# Patient Record
Sex: Female | Born: 1955
Health system: Southern US, Community
[De-identification: ages and names within clinical notes are randomized; demographics above are authoritative.]

## PROBLEM LIST (undated history)

## (undated) DIAGNOSIS — M7070 Other bursitis of hip, unspecified hip: Secondary | ICD-10-CM

## (undated) DIAGNOSIS — M4800 Spinal stenosis, site unspecified: Secondary | ICD-10-CM

## (undated) DIAGNOSIS — K219 Gastro-esophageal reflux disease without esophagitis: Secondary | ICD-10-CM

## (undated) DIAGNOSIS — R202 Paresthesia of skin: Secondary | ICD-10-CM

## (undated) DIAGNOSIS — R251 Tremor, unspecified: Secondary | ICD-10-CM

## (undated) DIAGNOSIS — E079 Disorder of thyroid, unspecified: Secondary | ICD-10-CM

## (undated) DIAGNOSIS — E669 Obesity, unspecified: Secondary | ICD-10-CM

## (undated) DIAGNOSIS — M797 Fibromyalgia: Secondary | ICD-10-CM

## (undated) DIAGNOSIS — E039 Hypothyroidism, unspecified: Secondary | ICD-10-CM

## (undated) DIAGNOSIS — M51369 Other intervertebral disc degeneration, lumbar region without mention of lumbar back pain or lower extremity pain: Secondary | ICD-10-CM

## (undated) DIAGNOSIS — R51 Headache: Secondary | ICD-10-CM

## (undated) DIAGNOSIS — M1990 Unspecified osteoarthritis, unspecified site: Secondary | ICD-10-CM

## (undated) DIAGNOSIS — K7689 Other specified diseases of liver: Secondary | ICD-10-CM

## (undated) DIAGNOSIS — M71329 Other bursal cyst, unspecified elbow: Secondary | ICD-10-CM

## (undated) DIAGNOSIS — M5136 Other intervertebral disc degeneration, lumbar region: Secondary | ICD-10-CM

## (undated) DIAGNOSIS — E063 Autoimmune thyroiditis: Secondary | ICD-10-CM

## (undated) DIAGNOSIS — G629 Polyneuropathy, unspecified: Secondary | ICD-10-CM

## (undated) DIAGNOSIS — R700 Elevated erythrocyte sedimentation rate: Secondary | ICD-10-CM

## (undated) DIAGNOSIS — E215 Disorder of parathyroid gland, unspecified: Secondary | ICD-10-CM

## (undated) DIAGNOSIS — E559 Vitamin D deficiency, unspecified: Secondary | ICD-10-CM

## (undated) DIAGNOSIS — E213 Hyperparathyroidism, unspecified: Secondary | ICD-10-CM

## (undated) DIAGNOSIS — E785 Hyperlipidemia, unspecified: Secondary | ICD-10-CM

## (undated) DIAGNOSIS — K760 Fatty (change of) liver, not elsewhere classified: Secondary | ICD-10-CM

## (undated) DIAGNOSIS — R519 Headache, unspecified: Secondary | ICD-10-CM

## (undated) HISTORY — DX: Other bursitis of hip, unspecified hip: M70.70

## (undated) HISTORY — DX: Hypercalcemia: E83.52

## (undated) HISTORY — DX: Polyneuropathy, unspecified: G62.9

## (undated) HISTORY — PX: ANKLE SURGERY: SHX546

## (undated) HISTORY — PX: ABDOMINAL HYSTERECTOMY: SHX81

## (undated) HISTORY — DX: Tremor, unspecified: R25.1

## (undated) HISTORY — PX: FOOT SURGERY: SHX648

## (undated) HISTORY — DX: Other intervertebral disc degeneration, lumbar region: M51.36

## (undated) HISTORY — DX: Paresthesia of skin: R20.2

## (undated) HISTORY — DX: Elevated erythrocyte sedimentation rate: R70.0

## (undated) HISTORY — DX: Other intervertebral disc degeneration, lumbar region without mention of lumbar back pain or lower extremity pain: M51.369

## (undated) HISTORY — DX: Fatty (change of) liver, not elsewhere classified: K76.0

## (undated) HISTORY — DX: Hyperlipidemia, unspecified: E78.5

## (undated) HISTORY — DX: Other bursal cyst, unspecified elbow: M71.329

## (undated) HISTORY — DX: Hyperparathyroidism, unspecified: E21.3

## (undated) HISTORY — PX: TOE SURGERY: SHX1073

## (undated) HISTORY — DX: Vitamin D deficiency, unspecified: E55.9

## (undated) HISTORY — DX: Unspecified osteoarthritis, unspecified site: M19.90

## (undated) HISTORY — DX: Obesity, unspecified: E66.9

---

## 1999-10-15 ENCOUNTER — Encounter: Payer: Self-pay | Admitting: *Deleted

## 1999-10-15 ENCOUNTER — Ambulatory Visit (HOSPITAL_COMMUNITY): Admission: RE | Admit: 1999-10-15 | Discharge: 1999-10-15 | Payer: Self-pay | Admitting: *Deleted

## 2001-05-20 ENCOUNTER — Encounter: Payer: Self-pay | Admitting: *Deleted

## 2001-05-20 ENCOUNTER — Ambulatory Visit (HOSPITAL_COMMUNITY): Admission: RE | Admit: 2001-05-20 | Discharge: 2001-05-20 | Payer: Self-pay | Admitting: *Deleted

## 2001-05-27 ENCOUNTER — Encounter: Payer: Self-pay | Admitting: *Deleted

## 2001-05-27 ENCOUNTER — Ambulatory Visit (HOSPITAL_COMMUNITY): Admission: RE | Admit: 2001-05-27 | Discharge: 2001-05-27 | Payer: Self-pay | Admitting: *Deleted

## 2001-06-06 ENCOUNTER — Ambulatory Visit (HOSPITAL_COMMUNITY): Admission: RE | Admit: 2001-06-06 | Discharge: 2001-06-06 | Payer: Self-pay | Admitting: Neurosurgery

## 2001-06-06 ENCOUNTER — Encounter: Payer: Self-pay | Admitting: Neurosurgery

## 2001-12-20 ENCOUNTER — Encounter: Payer: Self-pay | Admitting: Internal Medicine

## 2001-12-20 ENCOUNTER — Ambulatory Visit (HOSPITAL_COMMUNITY): Admission: RE | Admit: 2001-12-20 | Discharge: 2001-12-20 | Payer: Self-pay | Admitting: Internal Medicine

## 2002-05-30 ENCOUNTER — Ambulatory Visit (HOSPITAL_COMMUNITY): Admission: RE | Admit: 2002-05-30 | Discharge: 2002-05-30 | Payer: Self-pay | Admitting: Internal Medicine

## 2002-05-30 ENCOUNTER — Encounter: Payer: Self-pay | Admitting: Internal Medicine

## 2003-07-16 ENCOUNTER — Encounter: Payer: Self-pay | Admitting: Family Medicine

## 2003-07-16 ENCOUNTER — Encounter: Admission: RE | Admit: 2003-07-16 | Discharge: 2003-07-16 | Payer: Self-pay | Admitting: Family Medicine

## 2003-10-08 ENCOUNTER — Encounter: Admission: RE | Admit: 2003-10-08 | Discharge: 2003-10-08 | Payer: Self-pay | Admitting: Family Medicine

## 2004-05-19 ENCOUNTER — Emergency Department (HOSPITAL_COMMUNITY): Admission: EM | Admit: 2004-05-19 | Discharge: 2004-05-19 | Payer: Self-pay | Admitting: Family Medicine

## 2004-05-24 ENCOUNTER — Emergency Department (HOSPITAL_COMMUNITY): Admission: EM | Admit: 2004-05-24 | Discharge: 2004-05-24 | Payer: Self-pay | Admitting: Family Medicine

## 2004-10-20 ENCOUNTER — Encounter: Admission: RE | Admit: 2004-10-20 | Discharge: 2004-10-20 | Payer: Self-pay | Admitting: Family Medicine

## 2004-10-31 ENCOUNTER — Emergency Department (HOSPITAL_COMMUNITY): Admission: EM | Admit: 2004-10-31 | Discharge: 2004-10-31 | Payer: Self-pay | Admitting: Family Medicine

## 2005-02-11 ENCOUNTER — Emergency Department (HOSPITAL_COMMUNITY): Admission: EM | Admit: 2005-02-11 | Discharge: 2005-02-11 | Payer: Self-pay | Admitting: Emergency Medicine

## 2006-03-29 ENCOUNTER — Encounter: Admission: RE | Admit: 2006-03-29 | Discharge: 2006-03-29 | Payer: Self-pay | Admitting: Family Medicine

## 2006-07-14 ENCOUNTER — Emergency Department (HOSPITAL_COMMUNITY): Admission: EM | Admit: 2006-07-14 | Discharge: 2006-07-14 | Payer: Self-pay | Admitting: Emergency Medicine

## 2006-07-28 ENCOUNTER — Emergency Department (HOSPITAL_COMMUNITY): Admission: EM | Admit: 2006-07-28 | Discharge: 2006-07-28 | Payer: Self-pay | Admitting: Emergency Medicine

## 2006-11-06 ENCOUNTER — Emergency Department (HOSPITAL_COMMUNITY): Admission: EM | Admit: 2006-11-06 | Discharge: 2006-11-06 | Payer: Self-pay | Admitting: Emergency Medicine

## 2007-11-02 ENCOUNTER — Emergency Department (HOSPITAL_COMMUNITY): Admission: EM | Admit: 2007-11-02 | Discharge: 2007-11-02 | Payer: Self-pay | Admitting: Emergency Medicine

## 2008-04-04 ENCOUNTER — Encounter: Admission: RE | Admit: 2008-04-04 | Discharge: 2008-04-04 | Payer: Self-pay | Admitting: Family Medicine

## 2008-08-14 ENCOUNTER — Emergency Department (HOSPITAL_COMMUNITY): Admission: EM | Admit: 2008-08-14 | Discharge: 2008-08-14 | Payer: Self-pay | Admitting: Emergency Medicine

## 2008-09-05 ENCOUNTER — Encounter: Admission: RE | Admit: 2008-09-05 | Discharge: 2008-09-05 | Payer: Self-pay | Admitting: Family Medicine

## 2008-11-04 ENCOUNTER — Emergency Department (HOSPITAL_COMMUNITY): Admission: EM | Admit: 2008-11-04 | Discharge: 2008-11-04 | Payer: Self-pay | Admitting: Emergency Medicine

## 2008-12-21 ENCOUNTER — Encounter: Admission: RE | Admit: 2008-12-21 | Discharge: 2008-12-21 | Payer: Self-pay | Admitting: Emergency Medicine

## 2008-12-22 ENCOUNTER — Encounter: Admission: RE | Admit: 2008-12-22 | Discharge: 2008-12-22 | Payer: Self-pay | Admitting: Emergency Medicine

## 2009-02-05 ENCOUNTER — Encounter: Payer: Self-pay | Admitting: Cardiology

## 2009-02-05 ENCOUNTER — Ambulatory Visit: Payer: Self-pay | Admitting: Cardiology

## 2009-02-05 DIAGNOSIS — M7150 Other bursitis, not elsewhere classified, unspecified site: Secondary | ICD-10-CM | POA: Insufficient documentation

## 2009-02-05 DIAGNOSIS — K219 Gastro-esophageal reflux disease without esophagitis: Secondary | ICD-10-CM | POA: Insufficient documentation

## 2009-02-05 DIAGNOSIS — R9431 Abnormal electrocardiogram [ECG] [EKG]: Secondary | ICD-10-CM | POA: Insufficient documentation

## 2009-05-15 ENCOUNTER — Emergency Department (HOSPITAL_COMMUNITY): Admission: EM | Admit: 2009-05-15 | Discharge: 2009-05-15 | Payer: Self-pay | Admitting: Emergency Medicine

## 2009-10-16 ENCOUNTER — Emergency Department (HOSPITAL_COMMUNITY): Admission: EM | Admit: 2009-10-16 | Discharge: 2009-10-16 | Payer: Self-pay | Admitting: Emergency Medicine

## 2009-10-20 ENCOUNTER — Encounter: Admission: RE | Admit: 2009-10-20 | Discharge: 2009-10-20 | Payer: Self-pay | Admitting: Family Medicine

## 2010-05-30 ENCOUNTER — Emergency Department (HOSPITAL_COMMUNITY): Admission: EM | Admit: 2010-05-30 | Discharge: 2010-05-30 | Payer: Self-pay | Admitting: Emergency Medicine

## 2010-07-02 ENCOUNTER — Encounter: Admission: RE | Admit: 2010-07-02 | Discharge: 2010-07-02 | Payer: Self-pay | Admitting: Orthopedic Surgery

## 2010-08-05 ENCOUNTER — Emergency Department (HOSPITAL_COMMUNITY): Admission: EM | Admit: 2010-08-05 | Discharge: 2010-08-06 | Payer: Self-pay | Admitting: Emergency Medicine

## 2010-10-18 ENCOUNTER — Encounter: Admission: RE | Admit: 2010-10-18 | Discharge: 2010-10-18 | Payer: Self-pay | Admitting: Family Medicine

## 2011-01-01 ENCOUNTER — Encounter: Payer: Self-pay | Admitting: Emergency Medicine

## 2011-01-10 NOTE — Assessment & Plan Note (Signed)
Summary: Cardiology Office Visit   Chief Complaint:  no cardiac complaints.  History of Present Illness: 55 year old female with past medical history of gastroesophageal reflux disease, stool by [facility] for an abnormal electrocardiogram. The patient has no prior cardiac history. She was recently seen on February 15 of 2010 for a routine physical examination by Dr. Lorenz Coaster. During that examination the patient apparently had an electrocardiogram  that was felt to be abnormal. It is described as having T wave inversions in leads V1 through V4.because of the above we were asked to further evaluate. Note the patient denies any chest pain, dyspnea on exertion, orthopnea, PND, pedal edema, palpitations or syncope.   Updated Prior Medication List: MULTIVITAMINS   TABS (MULTIPLE VITAMIN) qd VITAMIN D 1000 UNIT  TABS (CHOLECALCIFEROL) 2 tabs twice daily  Current Allergies: ! PREDNISONE ! * IVP DYE Past Medical History:    Reviewed history from 02/05/2009 and no changes required:       abnormal electrocardiogram       bilateral hip bursitis       Spinal stenosis, cervical spine       Abnormal cranial CT scan       reflux esophagitis  Past Surgical History:    Reviewed history from 02/05/2009 and no changes required:       hysterectomy       Surgery on both feet  Family History:    Reviewed history from 02/05/2009 and no changes required:       mother died at age 63 of lupus.       Father alive and well.  Social History:    Reviewed history from 02/05/2009 and no changes required:       former tobacco user quit in 1997       Patient does not consume alcohol.       Married       3 children       On disability   Review of Systems       no headaches, fevers, chills, productive cough, hemoptysis, dysphagia, odynophagia, melena, hematochezia, dysuria, hematuria, rash, seizure activity, claudication. The remaining systems are negative.   Vital Signs:  Patient Profile:   55 Years Old  Female Height:     66 inches Weight:      243 pounds Pulse rate:   86 / minute Pulse rhythm:   regular BP sitting:   130 / 82  (left arm)                Physical Exam  General:     Well developed/obese in NAD Skin warm/dry Patient not depressed No peripheral clubbing Back-normal HEENT-normal/normal eyelids Neck supple/normal carotid upstroke bilaterally; no bruits; no JVD; no thyromegaly chest - CTA/ normal expansion CV - RRR/normal S1 and S2; no murmurs, rubs or gallops; no rub; PMI nondisplaced Abdomen -NT/ND, no HSM, no mass, + bowel sounds, no bruit 2+ femoral pulses, no bruits Ext-no edema, chords, 2+ DP Neuro-grossly nonfocal      Impression & Recommendations:  Problem # 1:  ABNORMAL ELECTROCARDIOGRAM (ICD-794.31) the patient's electrocardiogram today reveals a normal sinus rhythm at a rate of 86. There are no significant ST changes noted. I do not have the electrocardiogram from Dr. Melanee Spry office. We'll obtain that. If indeed it shows anterior T-wave inversions in the anterior leads we will most likely schedule her to have a stress Myoview. However we will wait to review that prior to making that decision. If it is felt to be  normal then we will not pursue further ischemia evaluation. I'll see her back on an as-needed basis pending those results.  Problem # 2:  GERD (ICD-530.81) management per Dr. Lorenz Coaster.  Problem # 3:  OTHER BURSITIS DISORDERS (ICD-727.3) management per Dr. Lorenz Coaster.

## 2011-02-24 LAB — DIFFERENTIAL
Basophils Absolute: 0.1 10*3/uL (ref 0.0–0.1)
Basophils Relative: 1 % (ref 0–1)
Eosinophils Absolute: 0.1 10*3/uL (ref 0.0–0.7)
Eosinophils Relative: 1 % (ref 0–5)
Lymphocytes Relative: 24 % (ref 12–46)
Lymphs Abs: 2.4 10*3/uL (ref 0.7–4.0)
Monocytes Absolute: 0.8 10*3/uL (ref 0.1–1.0)
Monocytes Relative: 8 % (ref 3–12)
Neutro Abs: 6.5 10*3/uL (ref 1.7–7.7)
Neutrophils Relative %: 66 % (ref 43–77)

## 2011-02-24 LAB — URINALYSIS, ROUTINE W REFLEX MICROSCOPIC
Bilirubin Urine: NEGATIVE
Glucose, UA: NEGATIVE mg/dL
Hgb urine dipstick: NEGATIVE
Ketones, ur: NEGATIVE mg/dL
Nitrite: NEGATIVE
Protein, ur: NEGATIVE mg/dL
Specific Gravity, Urine: 1.017 (ref 1.005–1.030)
Urobilinogen, UA: 0.2 mg/dL (ref 0.0–1.0)
pH: 5.5 (ref 5.0–8.0)

## 2011-02-24 LAB — CBC
HCT: 34.8 % — ABNORMAL LOW (ref 36.0–46.0)
Hemoglobin: 11.7 g/dL — ABNORMAL LOW (ref 12.0–15.0)
MCH: 30.3 pg (ref 26.0–34.0)
MCHC: 33.5 g/dL (ref 30.0–36.0)
MCV: 90.4 fL (ref 78.0–100.0)
Platelets: 206 10*3/uL (ref 150–400)
RBC: 3.85 MIL/uL — ABNORMAL LOW (ref 3.87–5.11)
RDW: 14.2 % (ref 11.5–15.5)
WBC: 9.8 10*3/uL (ref 4.0–10.5)

## 2011-02-24 LAB — BASIC METABOLIC PANEL
BUN: 9 mg/dL (ref 6–23)
CO2: 25 mEq/L (ref 19–32)
Calcium: 9.8 mg/dL (ref 8.4–10.5)
Chloride: 110 mEq/L (ref 96–112)
Creatinine, Ser: 0.86 mg/dL (ref 0.4–1.2)
GFR calc Af Amer: >60 mL/min (ref >60)
GFR calc non Af Amer: >60 mL/min (ref >60)
Glucose, Bld: 118 mg/dL — ABNORMAL HIGH (ref 70–99)
Potassium: 3.9 mEq/L (ref 3.5–5.1)
Sodium: 138 mEq/L (ref 135–145)

## 2011-02-24 LAB — BRAIN NATRIURETIC PEPTIDE: Pro B Natriuretic peptide (BNP): 42.1 pg/mL (ref 0.0–100.0)

## 2011-04-05 ENCOUNTER — Other Ambulatory Visit: Payer: Self-pay | Admitting: Orthopedic Surgery

## 2011-04-05 DIAGNOSIS — M25571 Pain in right ankle and joints of right foot: Secondary | ICD-10-CM

## 2011-04-07 ENCOUNTER — Ambulatory Visit
Admission: RE | Admit: 2011-04-07 | Discharge: 2011-04-07 | Disposition: A | Discharge status: Home / Self Care | Payer: 59 | Source: Ambulatory Visit | Attending: Orthopedic Surgery | Admitting: Orthopedic Surgery

## 2011-04-07 DIAGNOSIS — M25571 Pain in right ankle and joints of right foot: Secondary | ICD-10-CM

## 2011-09-04 ENCOUNTER — Emergency Department (HOSPITAL_COMMUNITY)
Admission: EM | Admit: 2011-09-04 | Discharge: 2011-09-04 | Disposition: A | Discharge status: Home / Self Care | Payer: 59 | Attending: Emergency Medicine | Admitting: Emergency Medicine

## 2011-09-04 DIAGNOSIS — S46909A Unspecified injury of unspecified muscle, fascia and tendon at shoulder and upper arm level, unspecified arm, initial encounter: Secondary | ICD-10-CM | POA: Insufficient documentation

## 2011-09-04 DIAGNOSIS — M25519 Pain in unspecified shoulder: Secondary | ICD-10-CM | POA: Insufficient documentation

## 2011-09-04 DIAGNOSIS — X500XXA Overexertion from strenuous movement or load, initial encounter: Secondary | ICD-10-CM | POA: Insufficient documentation

## 2011-09-04 DIAGNOSIS — S4980XA Other specified injuries of shoulder and upper arm, unspecified arm, initial encounter: Secondary | ICD-10-CM | POA: Insufficient documentation

## 2012-01-25 DIAGNOSIS — M7918 Myalgia, other site: Secondary | ICD-10-CM | POA: Insufficient documentation

## 2012-05-10 DIAGNOSIS — M47816 Spondylosis without myelopathy or radiculopathy, lumbar region: Secondary | ICD-10-CM | POA: Insufficient documentation

## 2012-05-28 ENCOUNTER — Emergency Department (HOSPITAL_COMMUNITY)
Admission: EM | Admit: 2012-05-28 | Discharge: 2012-05-28 | Disposition: A | Discharge status: Home / Self Care | Payer: 59 | Attending: Emergency Medicine | Admitting: Emergency Medicine

## 2012-05-28 ENCOUNTER — Encounter (HOSPITAL_COMMUNITY): Payer: Self-pay | Admitting: *Deleted

## 2012-05-28 ENCOUNTER — Emergency Department (HOSPITAL_COMMUNITY): Payer: 59

## 2012-05-28 DIAGNOSIS — M79609 Pain in unspecified limb: Secondary | ICD-10-CM | POA: Insufficient documentation

## 2012-05-28 DIAGNOSIS — S93509A Unspecified sprain of unspecified toe(s), initial encounter: Secondary | ICD-10-CM

## 2012-05-28 MED ORDER — NAPROXEN 500 MG PO TABS: 500.0000 mg | ORAL_TABLET | Freq: Two times a day (BID) | ORAL | Status: DC

## 2012-05-28 MED ORDER — TRAMADOL HCL 50 MG PO TABS: 50.0000 mg | ORAL_TABLET | Freq: Four times a day (QID) | ORAL | Status: AC | PRN

## 2012-05-28 NOTE — Discharge Instructions (Signed)
Foot Sprain  The muscles and cord like structures which attach muscle to bone (tendons) that surround the feet are made up of units. A foot sprain can occur at the weakest spot in any of these units. This condition is most often caused by injury to or overuse of the foot, as from playing contact sports, or aggravating a previous injury, or from poor conditioning, or obesity.  SYMPTOMS  · Pain with movement of the foot.  · Tenderness and swelling at the injury site.  · Loss of strength is present in moderate or severe sprains.  THE THREE GRADES OR SEVERITY OF FOOT SPRAIN ARE:  · Mild (Grade I): Slightly pulled muscle without tearing of muscle or tendon fibers or loss of strength.  · Moderate (Grade II): Tearing of fibers in a muscle, tendon, or at the attachment to bone, with small decrease in strength.  · Severe (Grade III): Rupture of the muscle-tendon-bone attachment, with separation of fibers. Severe sprain requires surgical repair. Often repeating (chronic) sprains are caused by overuse. Sudden (acute) sprains are caused by direct injury or over-use.  DIAGNOSIS   Diagnosis of this condition is usually by your own observation. If problems continue, a caregiver may be required for further evaluation and treatment. X-rays may be required to make sure there are not breaks in the bones (fractures) present. Continued problems may require physical therapy for treatment.  PREVENTION  · Use strength and conditioning exercises appropriate for your sport.  · Warm up properly prior to working out.  · Use athletic shoes that are made for the sport you are participating in.  · Allow adequate time for healing. Early return to activities makes repeat injury more likely, and can lead to an unstable arthritic foot that can result in prolonged disability. Mild sprains generally heal in 3 to 10 days, with moderate and severe sprains taking 2 to 10 weeks. Your caregiver can help you determine the proper time required for  healing.  HOME CARE INSTRUCTIONS   · Apply ice to the injury for 15 to 20 minutes, 3 to 4 times per day. Put the ice in a plastic bag and place a towel between the bag of ice and your skin.  · An elastic wrap (like an Ace bandage) may be used to keep swelling down.  · Keep foot above the level of the heart, or at least raised on a footstool, when swelling and pain are present.  · Try to avoid use other than gentle range of motion while the foot is painful. Do not resume use until instructed by your caregiver. Then begin use gradually, not increasing use to the point of pain. If pain does develop, decrease use and continue the above measures, gradually increasing activities that do not cause discomfort, until you gradually achieve normal use.  · Use crutches if and as instructed, and for the length of time instructed.  · Keep injured foot and ankle wrapped between treatments.  · Massage foot and ankle for comfort and to keep swelling down. Massage from the toes up towards the knee.  · Only take over-the-counter or prescription medicines for pain, discomfort, or fever as directed by your caregiver.  SEEK IMMEDIATE MEDICAL CARE IF:   · Your pain and swelling increase, or pain is not controlled with medications.  · You have loss of feeling in your foot or your foot turns cold or blue.  · You develop new, unexplained symptoms, or an increase of the symptoms that brought you   to your caregiver.  MAKE SURE YOU:   · Understand these instructions.  · Will watch your condition.  · Will get help right away if you are not doing well or get worse.  Document Released: 05/19/2002 Document Revised: 11/16/2011 Document Reviewed: 07/16/2008  [employer]® Patient Information ©2012 [employer].

## 2012-05-28 NOTE — ED Notes (Signed)
Pt reports R foot pain since Saturday, unable to bear weight on ball of foot.

## 2012-05-28 NOTE — ED Provider Notes (Signed)
History     CSN: [id number]  Arrival date & time 05/28/12  1538   First MD Initiated Contact with Patient 05/28/12 1840     7:02 PM  HPI Patient reports pain in her right foot for 5 days. Reports most significant pain is in the ball of her foot. States she's been doing exercises and is uncertain whether she hurt her foot from exercising. Denies other known injury, erythema. Reports mild swelling.  Patient is a 56 y.o. female presenting with lower extremity pain. The history is provided by the patient.  Foot Pain This is a new problem. The current episode started in the past 7 days. The problem occurs constantly. The problem has been unchanged. Pertinent negatives include no fever, joint swelling, myalgias, nausea, numbness or weakness. The symptoms are aggravated by standing and walking. She has tried rest and acetaminophen for the symptoms. The treatment provided no relief.    History reviewed. No pertinent past medical history.  Past Surgical History  Procedure Date  . Abdominal hysterectomy   . Foot surgery     No family history on file.  History  Substance Use Topics  . Smoking status: Never Smoker   . Smokeless tobacco: Not on file  . Alcohol Use: No    OB History    Grav Para Term Preterm Abortions TAB SAB Ect Mult Living                  Review of Systems  Constitutional: Negative for fever.  Gastrointestinal: Negative for nausea.  Musculoskeletal: Negative for myalgias and joint swelling.       Foot pain and swelling  Neurological: Negative for weakness and numbness.  All other systems reviewed and are negative.    Allergies  Contrast media and Prednisone  Home Medications   Current Outpatient Rx  Name Route Sig Dispense Refill  . VITAMIN D 1000 UNITS PO TABS Oral Take 2,000 Units by mouth daily.    Marland Kitchen MAGNESIUM OXIDE 400 MG PO TABS Oral Take 400 mg by mouth daily.    . ADULT MULTIVITAMIN W/MINERALS CH Oral Take 1 tablet by mouth daily. [facility]        BP 123/67  Pulse 56  Temp 98.3 F (36.8 C) (Oral)  Resp 20  SpO2 99%  Physical Exam  Vitals reviewed. Constitutional: She is oriented to person, place, and time. Vital signs are normal. She appears well-developed and well-nourished.  HENT:  Head: Normocephalic and atraumatic.  Eyes: Conjunctivae are normal. Pupils are equal, round, and reactive to light.  Neck: Normal range of motion. Neck supple.  Cardiovascular: Normal rate, regular rhythm and normal heart sounds.  Exam reveals no friction rub.   No murmur heard. Pulmonary/Chest: Effort normal and breath sounds normal. She has no wheezes. She has no rhonchi. She has no rales. She exhibits no tenderness.  Musculoskeletal: Normal range of motion.       Right foot: She exhibits tenderness and swelling. She exhibits normal capillary refill, no crepitus, no deformity and no laceration.       Feet:       Right foot: Pain reproduced with extension of her right second toe. No erythema, but mild edema of foot. No pain with flexion of right second toe. No mass palpable. No obvious deformity or crepitus.  Neurological: She is alert and oriented to person, place, and time. Coordination normal.  Skin: Skin is warm and dry. No rash noted. No erythema. No pallor.    ED  Course  Procedures   Dg Foot Complete Right  05/28/2012  *RADIOLOGY REPORT*  Clinical Data: Dorsal foot pain.  RIGHT FOOT COMPLETE - 3+ VIEW  Comparison: 07/14/2006  Findings: Surgical screw is again seen in the second metatarsal head.  No evidence of hardware failure or loosening.  No evidence of fracture or dislocation. Mild degenerative spurring is seen in the along the dorsal aspect of the tarsal bones in the midfoot, consistent with mild osteoarthritis.  No other significant bone abnormality identified.  IMPRESSION:  1.  No acute findings. 2.  Mild mid foot osteoarthritis.  Original Report Authenticated By: Danae Orleans, M.D.    MDM    Patient reports she has  crutches at home. Advised patient to elevate, rest, and use warm compresses. Patient likely has a toe sprain. Advised close followup with orthopedic physician if symptoms do not improve with an appropriate amount of rest. Patient voices understanding and is ready for     Thomasene Lot, PA-C 05/28/12 1924

## 2012-05-29 NOTE — ED Provider Notes (Signed)
Medical screening examination/treatment/procedure(s) were performed by non-physician practitioner and as supervising physician I was immediately available for consultation/collaboration.   Rolan Bucco, MD 05/29/12 0010

## 2012-07-04 DIAGNOSIS — M8430XA Stress fracture, unspecified site, initial encounter for fracture: Secondary | ICD-10-CM | POA: Insufficient documentation

## 2012-07-16 ENCOUNTER — Encounter (HOSPITAL_COMMUNITY): Payer: Self-pay | Admitting: *Deleted

## 2012-07-16 ENCOUNTER — Emergency Department (HOSPITAL_COMMUNITY)
Admission: EM | Admit: 2012-07-16 | Discharge: 2012-07-16 | Disposition: A | Discharge status: Home / Self Care | Payer: 59 | Attending: Emergency Medicine | Admitting: Emergency Medicine

## 2012-07-16 ENCOUNTER — Emergency Department (HOSPITAL_COMMUNITY): Payer: 59

## 2012-07-16 DIAGNOSIS — S6990XA Unspecified injury of unspecified wrist, hand and finger(s), initial encounter: Secondary | ICD-10-CM | POA: Insufficient documentation

## 2012-07-16 DIAGNOSIS — Z87891 Personal history of nicotine dependence: Secondary | ICD-10-CM | POA: Insufficient documentation

## 2012-07-16 DIAGNOSIS — W2209XA Striking against other stationary object, initial encounter: Secondary | ICD-10-CM | POA: Insufficient documentation

## 2012-07-16 DIAGNOSIS — S59909A Unspecified injury of unspecified elbow, initial encounter: Secondary | ICD-10-CM | POA: Insufficient documentation

## 2012-07-16 NOTE — ED Provider Notes (Signed)
History     CSN: [id number]  Arrival date & time 07/16/12  1453   First MD Initiated Contact with Patient 07/16/12 1754      Chief Complaint  Patient presents with  . LT hand pain   . LT elbow pain     (Consider location/radiation/quality/duration/timing/severity/associated sxs/prior treatment) HPI Comments: Meghan Owen 56 y.o. female   The chief complaint is: Patient presents with:   LT hand pain    LT elbow pain    The patient has medical history significant for:   History reviewed. No pertinent past medical history.  Patient presents with left elbow pain after hitting it on a door frame on 07/15/12. Patient states that initially the pain went away but returned with increased pain, numbness, and limited use. Patient states that a past MRI of the same elbow showed a cyst with possible nerve compression and arthritis. Pain is reported 8/10. Denies fever, chills. Denies CP, SOB, palpitations. Denies NV, abdominal pain.  The history is provided by the patient.    History reviewed. No pertinent past medical history.  Past Surgical History  Procedure Date  . Abdominal hysterectomy   . Foot surgery     No family history on file.  History  Substance Use Topics  . Smoking status: Former Games developer  . Smokeless tobacco: Not on file  . Alcohol Use: No    OB History    Grav Para Term Preterm Abortions TAB SAB Ect Mult Living                  Review of Systems  Constitutional: Negative for fever and chills.  Respiratory: Negative for shortness of breath.   Cardiovascular: Negative for chest pain and palpitations.  Gastrointestinal: Negative for nausea, vomiting and abdominal pain.  Musculoskeletal: Positive for joint swelling.    Allergies  Contrast media and Prednisone  Home Medications   Current Outpatient Rx  Name Route Sig Dispense Refill  . CALCIUM 500 PO Oral Take 1 capsule by mouth daily.    Marland Kitchen VITAMIN D 1000 UNITS PO TABS Oral Take 2,000 Units by mouth  daily.    Marland Kitchen MAGNESIUM OXIDE 400 MG PO TABS Oral Take 400 mg by mouth daily.    . ADULT MULTIVITAMIN W/MINERALS CH Oral Take 1 tablet by mouth daily. [facility]      BP 113/70  Pulse 70  Temp 98.5 F (36.9 C)  Resp 18  Wt 232 lb (105.235 kg)  SpO2 99%  Physical Exam  Nursing note and vitals reviewed. Constitutional: She appears well-developed and well-nourished.  HENT:  Head: Normocephalic and atraumatic.  Mouth/Throat: Oropharynx is clear and moist.  Cardiovascular: Normal rate, regular rhythm and normal heart sounds.   Pulmonary/Chest: Effort normal and breath sounds normal.  Abdominal: Soft. Bowel sounds are normal. There is no tenderness.  Musculoskeletal: She exhibits edema and tenderness.       Decreased strength and ROM in left arm. Patient had difficulty with thumb opposition.   Neurological: She is alert. A cranial nerve deficit is present. She exhibits abnormal muscle tone.       Decreased sensation of the left arm and decreased strength.  Skin: Skin is warm and dry.    ED Course  Procedures (including critical care time)  Labs Reviewed - No data to display Dg Elbow 2 Views Left  07/16/2012  *RADIOLOGY REPORT*  Clinical Data: Elbow injury with pain, tingling, and swelling.  LEFT ELBOW - 2 VIEW  Comparison: None.  Findings: This two views series of the left elbow demonstrates no fracture or elbow effusion.  IMPRESSION:  1.  No acute bony findings are observed.  Original Report Authenticated By: Dellia Cloud, M.D.   Dg Hand 2 View Left  07/16/2012  *RADIOLOGY REPORT*  Clinical Data: Tingling and swelling in the left hand after injury.  LEFT HAND - 2 VIEW  Comparison: None.  Findings: No fracture, foreign body, or acute bony findings are identified.  IMPRESSION:  No significant abnormality identified.  Original Report Authenticated By: Dellia Cloud, M.D.     1. Elbow injury       MDM  Patient presented s/p elbow injury. Declined pain medication.  Elbow imaging: unremarkable. Ortho placed patient in shoulder sling. Patient given instructions to follow-up with ortho. Patient has no red flags for fracture or nerve damage. Return precautions given verbally and in discharge summary.      Pixie Casino, PA-C 07/17/12 [phone]

## 2012-07-16 NOTE — ED Provider Notes (Signed)
Medical screening examination/treatment/procedure(s) were conducted as a shared visit with non-physician practitioner(s) and myself.  I personally evaluated the patient during the encounter Pt struck left elbow on door frame.  C/o elbow pain and numbness in left hand.  pe tender at medial elbow over guyon's canal.  Decreased LT sensation over thumb, small finger and dorsum of hand.   Grips 4/5. Finger and wrist extension 4/5.  Will give sling and release. ? Neuropraxia.    Cheri Guppy, MD 07/16/12 [phone]

## 2012-07-16 NOTE — ED Notes (Signed)
Pt states "yesterday went to scratch my head and hit my elbow on the door frame, is tingling and is weak in my hand"

## 2012-07-16 NOTE — ED Notes (Signed)
Ortho tech paged for shoulder sling/immobilizer.

## 2012-07-19 NOTE — ED Provider Notes (Signed)
Medical screening examination/treatment/procedure(s) were conducted as a shared visit with non-physician practitioner(s) and myself.  I personally evaluated the patient during the encounter  Cheri Guppy, MD 07/19/12 1511

## 2012-08-22 DIAGNOSIS — G5622 Lesion of ulnar nerve, left upper limb: Secondary | ICD-10-CM | POA: Insufficient documentation

## 2012-09-05 DIAGNOSIS — M25571 Pain in right ankle and joints of right foot: Secondary | ICD-10-CM | POA: Insufficient documentation

## 2012-09-05 DIAGNOSIS — M2040 Other hammer toe(s) (acquired), unspecified foot: Secondary | ICD-10-CM | POA: Insufficient documentation

## 2012-09-05 DIAGNOSIS — M7660 Achilles tendinitis, unspecified leg: Secondary | ICD-10-CM | POA: Insufficient documentation

## 2012-11-01 DIAGNOSIS — M545 Low back pain, unspecified: Secondary | ICD-10-CM | POA: Insufficient documentation

## 2013-05-21 DIAGNOSIS — F449 Dissociative and conversion disorder, unspecified: Secondary | ICD-10-CM | POA: Insufficient documentation

## 2013-05-23 DIAGNOSIS — M7670 Peroneal tendinitis, unspecified leg: Secondary | ICD-10-CM | POA: Insufficient documentation

## 2013-06-01 ENCOUNTER — Encounter (HOSPITAL_COMMUNITY): Payer: Self-pay | Admitting: Emergency Medicine

## 2013-06-01 ENCOUNTER — Emergency Department (HOSPITAL_COMMUNITY)
Admission: EM | Admit: 2013-06-01 | Discharge: 2013-06-01 | Disposition: A | Discharge status: Home / Self Care | Payer: 59 | Attending: Emergency Medicine | Admitting: Emergency Medicine

## 2013-06-01 ENCOUNTER — Emergency Department (HOSPITAL_COMMUNITY): Payer: 59

## 2013-06-01 DIAGNOSIS — Y929 Unspecified place or not applicable: Secondary | ICD-10-CM | POA: Insufficient documentation

## 2013-06-01 DIAGNOSIS — M25571 Pain in right ankle and joints of right foot: Secondary | ICD-10-CM

## 2013-06-01 DIAGNOSIS — Z8739 Personal history of other diseases of the musculoskeletal system and connective tissue: Secondary | ICD-10-CM | POA: Insufficient documentation

## 2013-06-01 DIAGNOSIS — S99919A Unspecified injury of unspecified ankle, initial encounter: Secondary | ICD-10-CM | POA: Insufficient documentation

## 2013-06-01 DIAGNOSIS — X500XXA Overexertion from strenuous movement or load, initial encounter: Secondary | ICD-10-CM | POA: Insufficient documentation

## 2013-06-01 DIAGNOSIS — Z9889 Other specified postprocedural states: Secondary | ICD-10-CM | POA: Insufficient documentation

## 2013-06-01 DIAGNOSIS — Z87891 Personal history of nicotine dependence: Secondary | ICD-10-CM | POA: Insufficient documentation

## 2013-06-01 DIAGNOSIS — S8990XA Unspecified injury of unspecified lower leg, initial encounter: Secondary | ICD-10-CM | POA: Insufficient documentation

## 2013-06-01 DIAGNOSIS — S99929A Unspecified injury of unspecified foot, initial encounter: Secondary | ICD-10-CM | POA: Insufficient documentation

## 2013-06-01 DIAGNOSIS — Y9389 Activity, other specified: Secondary | ICD-10-CM | POA: Insufficient documentation

## 2013-06-01 DIAGNOSIS — E669 Obesity, unspecified: Secondary | ICD-10-CM | POA: Insufficient documentation

## 2013-06-01 DIAGNOSIS — Z79899 Other long term (current) drug therapy: Secondary | ICD-10-CM | POA: Insufficient documentation

## 2013-06-01 HISTORY — DX: Spinal stenosis, site unspecified: M48.00

## 2013-06-01 NOTE — ED Notes (Signed)
Pt from home, reports that she was on her exercise machine and felt something pop in the back of her R ankle . Pt is able to ambulate a little, but has pain with ambulation. Pt has swelling to posterior R ankle. Pt in NAD and A&O

## 2013-06-01 NOTE — ED Provider Notes (Signed)
History     CSN: [id number]  Arrival date & time 06/01/13  1128   First MD Initiated Contact with Patient 06/01/13 1147      Chief Complaint  Patient presents with  . Ankle Pain    (Consider location/radiation/quality/duration/timing/severity/associated sxs/prior treatment) HPI Meghan Owen 57 year old female with a past medical history of obesity and previous foot surgeries who presents the emergency department with chief complaint of right ankle pain.  Patient states that yesterday she was on an elliptical exercise machine.  She felt a sudden pop exterior to the lateral malleolus.  She states that she had immediate pain and swelling.  The patient is able to walk on her ankle however she has significant pain with ambulation.  She denies any paresthesia in the foot.  She denies any recent procedures to the foot.  She is followed by Dr. Lavone Orn at [facility] in orthopedics.  The patient has not taken anything for her pain.  She describes the pain as constant, aching, worse with ambulation better with rest.  It does not radiate to  Past Medical History  Diagnosis Date  . Spinal stenosis     Past Surgical History  Procedure Laterality Date  . Abdominal hysterectomy    . Foot surgery      No family history on file.  History  Substance Use Topics  . Smoking status: Former Games developer  . Smokeless tobacco: Not on file  . Alcohol Use: No    OB History   Grav Para Term Preterm Abortions TAB SAB Ect Mult Living                  Review of Systems Ten systems reviewed and are negative for acute change, except as noted in the HPI.    Allergies  Contrast media and Prednisone  Home Medications   Current Outpatient Rx  Name  Route  Sig  Dispense  Refill  . Calcium Carbonate (CALCIUM 500 PO)   Oral   Take 1 capsule by mouth daily.         . cholecalciferol (VITAMIN D) 1000 UNITS tablet   Oral   Take 2,000 Units by mouth daily.         . magnesium oxide (MAG-OX)  400 MG tablet   Oral   Take 400 mg by mouth daily.         . Multiple Vitamin (MULTIVITAMIN WITH MINERALS) TABS   Oral   Take 1 tablet by mouth daily. [facility]           BP 121/60  Pulse 57  Temp(Src) 98.9 F (37.2 C) (Oral)  Resp 20  SpO2 98%  Physical Exam Physical Exam  Nursing note and vitals reviewed. Constitutional: She is oriented to person, place, and time. She appears well-developed and well-nourished. No distress.  HENT:  Head: Normocephalic and atraumatic.  Eyes: Conjunctivae normal and EOM are normal. Pupils are equal, round, and reactive to light. No scleral icterus.  Neck: Normal range of motion.  Cardiovascular: Normal rate, regular rhythm and normal heart sounds.  Exam reveals no gallop and no friction rub.   No murmur heard. Pulmonary/Chest: Effort normal and breath sounds normal. No respiratory distress.  Abdominal: Soft. Bowel sounds are normal. She exhibits no distension and no mass. There is no tenderness. There is no guarding.  Neurological: She is alert and oriented to person, place, and time.  Skin: Skin is warm and dry. She is not diaphoretic.  A right ankle  exam was performed. Skin: normal Swelling: moderate and maximal at posterior to the lateral malleolus Tenderness:  Head of 5th metatarsal, post to viral malleolus, tender in the calf. ROM: Patient range of motion is limited eversion, strength is also taking decreased.  She has full range of motion with dorsiflexion plantar flexion and inversion of the ankle. Gait: antalgic Stability: stable to testing Neurological Exam: normal Vascular Exam: normal Thompson Test: negative   ED Course  Procedures (including critical care time)  Labs Reviewed - No data to display No results found.   1. Acute ankle pain, right       MDM  12:46 PM Patient with tenderness in the malleolar stone as well as the base of the fifth metatarsal.  According to the white ankle rules patient should receive  imaging of the ankle.   I have ordered ankle x-ray.  She has declined x-ray.  I suspect patient has possibly have an avulsion fracture versus rupture of the tibialis posterior.     1:27 PM Patient with negative x-r  She has significant tenderness on the base of the fifth metatarsal.  She significant swelling posterior to the lateral malleolus.  She is unable to eat for the ankle and has weakness.  I suspect erroneous brevis tendon rupture.  I spoke with Dr. Dion Saucier who is on call for orthopedics today.  He states that she may be placed in a cam walker for comfort.  There is no sign of fifth metatarsal fracture.  Patient was given crutches and cam walker boot she can followup with her orthopedist at [facility].    Arthor Captain, PA-C 06/01/13 1541

## 2013-06-01 NOTE — Discharge Instructions (Signed)
Please use your CAM walker when walking.  Please overweight direct weightbearing over the next 4 days.  She may to touch when walking.  Please use your crutches. Please follow up with Dr. Pernell Dupre as soon as possible.  He may use Tylenol or Advil for pain.   RICE: Routine Care for Injuries    The routine care of many injuries includes Rest, Ice, Compression, and Elevation (RICE).  HOME CARE INSTRUCTIONS  Rest is needed to allow your body to heal. Routine activities can usually be resumed when comfortable. Injured tendons and bones can take up to 6 weeks to heal. Tendons are the cord-like structures that attach muscle to bone.  Ice following an injury helps keep the swelling down and reduces pain.  Put ice in a plastic bag.  Place a towel between your skin and the bag.  Leave the ice on for 15-20 minutes, 3-4 times a day. Do this while awake, for the first 24 to 48 hours. After that, continue as directed by your caregiver.  Compression helps keep swelling down. It also gives support and helps with discomfort. If an elastic bandage has been applied, it should be removed and reapplied every 3 to 4 hours. It should not be applied tightly, but firmly enough to keep swelling down. Watch fingers or toes for swelling, bluish discoloration, coldness, numbness, or excessive pain. If any of these problems occur, remove the bandage and reapply loosely. Contact your caregiver if these problems continue.  Elevation helps reduce swelling and decreases pain. With extremities, such as the arms, hands, legs, and feet, the injured area should be placed near or above the level of the heart, if possible. SEEK IMMEDIATE MEDICAL CARE IF:  You have persistent pain and swelling.  You develop redness, numbness, or unexpected weakness.  Your symptoms are getting worse rather than improving after several days. These symptoms may indicate that further evaluation or further X-rays are needed. Sometimes, X-rays may not show a  small broken bone (fracture) until 1 week or 10 days later. Make a follow-up appointment with your caregiver. Ask when your X-ray results will be ready. Make sure you get your X-ray results.  Document Released: 03/11/2001 Document Revised: 02/19/2012 Document Reviewed: 04/28/2011  [facility] Patient Information 2014 [place], [place].

## 2013-06-01 NOTE — ED Provider Notes (Signed)
Medical screening examination/treatment/procedure(s) were performed by non-physician practitioner and as supervising physician I was immediately available for consultation/collaboration.   Aldine Chakraborty L Louanna Vanliew, MD 06/01/13 2246 

## 2013-06-17 ENCOUNTER — Ambulatory Visit (HOSPITAL_COMMUNITY): Payer: 59 | Admitting: Psychology

## 2013-07-07 ENCOUNTER — Ambulatory Visit (HOSPITAL_COMMUNITY): Payer: 59 | Admitting: Psychology

## 2013-07-31 ENCOUNTER — Ambulatory Visit (HOSPITAL_COMMUNITY): Payer: 59 | Admitting: Psychology

## 2013-08-19 ENCOUNTER — Ambulatory Visit (INDEPENDENT_AMBULATORY_CARE_PROVIDER_SITE_OTHER): Payer: 59 | Admitting: Psychology

## 2013-08-19 DIAGNOSIS — G250 Essential tremor: Secondary | ICD-10-CM

## 2013-08-26 DIAGNOSIS — Q663 Other congenital varus deformities of feet, unspecified foot: Secondary | ICD-10-CM | POA: Insufficient documentation

## 2013-09-17 ENCOUNTER — Telehealth (HOSPITAL_COMMUNITY): Payer: Self-pay | Admitting: Psychology

## 2013-10-01 ENCOUNTER — Encounter (HOSPITAL_COMMUNITY): Payer: Self-pay | Admitting: Psychology

## 2013-10-01 NOTE — Progress Notes (Signed)
Patient:   Meghan Owen   DOB:   1956-04-17  MR Number:  [id number]  Location:  [facility] [address] [place] [address] Dept: [phone]           Date of Service:   08/19/2013  Start Time:   3 PM End Time:   4 PM  Provider/Observer:  Hershal Coria PSYD       Billing Code/Service: [phone]  Chief Complaint:     Chief Complaint  Patient presents with  . Stress  . Other    Tremors in both hands and feet    Reason for Service:  The patient was referred by Dr. Theodis Aguas at [facility]  neurology  for neuropsychological/psychological evaluation.  The patient reports that her neurologist concerns that gait abnormality and tremor were related to psychological factors such as stress and anxiety. She has had attempted treatment with Cymbalta. The patient reports that her tremors initially started in her left foot and then the left hand and progressed to the right foot than the right hand. She reports that these tremors occur with activity and not at rest. She has been assessed for condition such as Parkinson's disease and the symptoms and findings are not consistent with Parkinson's.  History:                                    The patient reports that the symptoms started in 1998 including sleep disturbance as well as pain symptoms. She then began to develop tremors. The patient reports that in 2007 she developed limited sleep due to taking care of her mother who later died that year. The patient reports that currently her sleep is pretty good with no change since then. The patient reports that she developed mumps and 2010 but some of these tremors predated that.  The patient reports that since she began developing his gait and tremor issues there've not been significant changes in the past year or 2. 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 The patient reports that she continues to experience pain in the lower half of her body along with tremors in all 4 limbs.  Review of available medical records are consistent with the patient's history she told me. The patient described chronic pain, diffuse body abnormal sensations, fatigue, and sleep disturbance. She has had extensive workup by neurology and rheumatology as well as infectious disease. More recently she has developed or in the bilateral upper and lower extremities with activities, standing, and walking. She describes these as progressing over the past month and become very bothersome to her. She is also difficulty with balance and ambulation. MRI has shown some degree of cervical stenosis but not adequate are significant enough to explain her symptoms. Multiple EMGs have been performed as well as MRI of the brain and thoracic spine and lumbar spine. None of these have shown any significant findings. Neurologist as considered a conversion disorder/stress related etiological factors. He felt the most likely explanation with psychogenic tremor with a referral for psychiatric/psychological evaluation  Current Status:  The patient describes ongoing bilateral tremors in both her hands and feet. She denies any significant symptoms of anxiety or depression and reports that her mood is quite good. Her hematologist past  stressors particularly that time she was taking care of her ailing mother she denies any significant stressors now with the exception of her numerous medical and neurological symptoms she has been coping with. The patient does acknowledge some mild memory difficulties but other than that denies any other psychological or psychiatric issues. The patient denies any history of depression or anxiety.  Reliability of Information:  Information was provided by both the patient as well as review of available medical records. Information  does appear to be valid and reliable.  Behavioral Observation: Meghan Owen  presents as a 57 y.o.-year-old Right handed Female who appeared her stated age. her dress was Appropriate and she was Well Groomed and her manners were Appropriate to the situation.  There were  noticeable tremors in both her hands and feet but no noticeable tremors in her head. There were no yes yes or no no movements of her head and neck noted. Her gait was impaired.  she displayed an appropriate level of cooperation and motivation.    Interactions:    Active   Attention:   within normal limits  Memory:   within normal limits  Visuo-spatial:   within normal limits  Speech (Volume):  normal  Speech:   normal pitch  Thought Process:  Coherent  Though Content:  WNL  Orientation:   person, place, time/date and situation  Judgment:   Good  Planning:   Good  Affect:    Affect was appropriate to the situation with no indications of anxiety or depression other than the upper symptoms of her hand and feet tremor.  Mood:    NA  Insight:   Good  Intelligence:   normal  Marital Status/Living: The patient was born and raised in [place] in [place] and also lives in [place] in [place]. Her father 36 years old and in good health and her mother died at age 2. The patient was married initially in 1974 is now married to her second husband. She has been 25 year old son, 21 year old son, and a 38 year old daughter. She spends her leisure time visiting her kids and reading. She currently lives with her husband and their house on the house.  Current Employment: The patient has no work history is not working now  Past Employment:  The patient has not worked in the past.  Substance Use:  No concerns of substance abuse are reported.    Education:   The patient completed the 11th grade and has had no formal education beyond that  Medical History:   Past Medical History  Diagnosis Date  . Spinal stenosis          Outpatient Encounter Prescriptions as of 08/19/2013  Medication Sig Dispense Refill  . Calcium Carbonate (CALCIUM 500 PO) Take 1 capsule by mouth daily.      . cholecalciferol (VITAMIN D) 1000 UNITS tablet Take 2,000 Units by mouth daily.      . magnesium oxide (MAG-OX) 400 MG tablet Take 400 mg by mouth daily.      . Multiple Vitamin (MULTIVITAMIN WITH MINERALS) TABS Take 1 tablet by mouth daily. [facility]       No facility-administered encounter medications on file as of 08/19/2013.          Sexual History:   History  Sexual Activity  . Sexual Activity: Not on file    Abuse/Trauma History: The patient denies any history of abuse or trauma.  Psychiatric History:  The patient denies any prior psychiatric history and no symptoms  of depression or anxiety in the past. She does acknowledge a major stressor have to do with her tremors and physical pain but nothing beyond that.  Family Med/Psych History: History reviewed. No pertinent family history.  Risk of Suicide/Violence: virtually non-existent   Impression/DX:  Throughout the clinical interview, the patient performed very well with regard to current mental status functioning. Extensive review of issues related to depression and anxiety was conducted and no indications of any significant anxiety or depression or other significant stressors at this time. There are very few issues that could attribute her current neurological symptoms to a conversion disorder or psychiatric stress. While I knowledge the potential etiological factors of her current symptoms have not been able to be identified I do think that the most likely diagnosis of these issues would be benign essential tremors. The patient develops worsening tremor with activity in both her hands and her feet and she has had disturbance in gait. While there are some other less likely but possible etiological factors in play. One was a possible fracture of viral infection and  illness such as her mumps in 2010. 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 The patient has had some viral infection since 2010 but current infectious disease workup have not shown any current viral illnesses of note. I do not think any specific neuropsychological testing beyond the neurobehavioral status review the morning at this time. Again, I think that the most likely diagnosis of the symptoms is benign essential tremor and not one of conversion disorder. In any other medical information were to be derived I think it would be well worth reassessing this issue and I would be more than happy to work with the patient regarding psychological/psychiatric status even though I think that the primary neurological symptoms are intact neurological and not conversion or psychological in nature.  Disposition/Plan:  I provided feedback to the patient regarding my findings and will for these to her treating neurologist. Our be more than happy to discuss these issues with her neurologist more fully and would also be more than happy to continue to see the patient if her neurologist felt it was warranted. I will provided feedback to the patient with these suggestions.  Diagnosis:    Axis I:  Essential tremor

## 2013-10-13 ENCOUNTER — Emergency Department (HOSPITAL_COMMUNITY): Payer: No Typology Code available for payment source

## 2013-10-13 ENCOUNTER — Emergency Department (HOSPITAL_COMMUNITY)
Admission: EM | Admit: 2013-10-13 | Discharge: 2013-10-13 | Disposition: A | Discharge status: Home / Self Care | Payer: No Typology Code available for payment source | Attending: Emergency Medicine | Admitting: Emergency Medicine

## 2013-10-13 ENCOUNTER — Encounter (HOSPITAL_COMMUNITY): Payer: Self-pay | Admitting: Emergency Medicine

## 2013-10-13 DIAGNOSIS — S139XXA Sprain of joints and ligaments of unspecified parts of neck, initial encounter: Secondary | ICD-10-CM | POA: Diagnosis not present

## 2013-10-13 DIAGNOSIS — Z8739 Personal history of other diseases of the musculoskeletal system and connective tissue: Secondary | ICD-10-CM | POA: Diagnosis not present

## 2013-10-13 DIAGNOSIS — Y9389 Activity, other specified: Secondary | ICD-10-CM | POA: Diagnosis not present

## 2013-10-13 DIAGNOSIS — S335XXA Sprain of ligaments of lumbar spine, initial encounter: Secondary | ICD-10-CM | POA: Insufficient documentation

## 2013-10-13 DIAGNOSIS — Z8639 Personal history of other endocrine, nutritional and metabolic disease: Secondary | ICD-10-CM | POA: Insufficient documentation

## 2013-10-13 DIAGNOSIS — Z87891 Personal history of nicotine dependence: Secondary | ICD-10-CM | POA: Insufficient documentation

## 2013-10-13 DIAGNOSIS — Z79899 Other long term (current) drug therapy: Secondary | ICD-10-CM | POA: Diagnosis not present

## 2013-10-13 DIAGNOSIS — S39012A Strain of muscle, fascia and tendon of lower back, initial encounter: Secondary | ICD-10-CM

## 2013-10-13 DIAGNOSIS — Z862 Personal history of diseases of the blood and blood-forming organs and certain disorders involving the immune mechanism: Secondary | ICD-10-CM | POA: Insufficient documentation

## 2013-10-13 DIAGNOSIS — Y92410 Unspecified street and highway as the place of occurrence of the external cause: Secondary | ICD-10-CM | POA: Diagnosis not present

## 2013-10-13 DIAGNOSIS — S161XXA Strain of muscle, fascia and tendon at neck level, initial encounter: Secondary | ICD-10-CM

## 2013-10-13 DIAGNOSIS — S0993XA Unspecified injury of face, initial encounter: Secondary | ICD-10-CM | POA: Diagnosis present

## 2013-10-13 HISTORY — DX: Disorder of thyroid, unspecified: E07.9

## 2013-10-13 MED ORDER — HYDROCODONE-ACETAMINOPHEN 5-325 MG PO TABS: 2.0000 | ORAL_TABLET | Freq: Four times a day (QID) | ORAL | Status: DC | PRN

## 2013-10-13 MED ORDER — IBUPROFEN 800 MG PO TABS: 800.0000 mg | ORAL_TABLET | Freq: Three times a day (TID) | ORAL | Status: DC

## 2013-10-13 MED ORDER — HYDROCODONE-ACETAMINOPHEN 5-325 MG PO TABS
1.0000 | ORAL_TABLET | Freq: Once | ORAL | Status: DC
  Filled 2013-10-13: qty 1
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
## 2014-03-04 ENCOUNTER — Ambulatory Visit: Payer: 59 | Attending: Orthopedic Surgery | Admitting: Physical Therapy

## 2014-03-04 DIAGNOSIS — M25579 Pain in unspecified ankle and joints of unspecified foot: Secondary | ICD-10-CM | POA: Insufficient documentation

## 2014-03-04 DIAGNOSIS — IMO0001 Reserved for inherently not codable concepts without codable children: Secondary | ICD-10-CM | POA: Insufficient documentation

## 2014-03-04 DIAGNOSIS — R262 Difficulty in walking, not elsewhere classified: Secondary | ICD-10-CM | POA: Insufficient documentation

## 2014-03-11 ENCOUNTER — Ambulatory Visit: Payer: 59 | Attending: Orthopedic Surgery | Admitting: Physical Therapy

## 2014-03-11 DIAGNOSIS — M25579 Pain in unspecified ankle and joints of unspecified foot: Secondary | ICD-10-CM | POA: Insufficient documentation

## 2014-03-11 DIAGNOSIS — IMO0001 Reserved for inherently not codable concepts without codable children: Secondary | ICD-10-CM | POA: Insufficient documentation

## 2014-03-11 DIAGNOSIS — R262 Difficulty in walking, not elsewhere classified: Secondary | ICD-10-CM | POA: Insufficient documentation

## 2014-03-12 ENCOUNTER — Ambulatory Visit: Payer: 59 | Admitting: Physical Therapy

## 2014-03-12 ENCOUNTER — Encounter (INDEPENDENT_AMBULATORY_CARE_PROVIDER_SITE_OTHER): Payer: Self-pay

## 2014-03-17 ENCOUNTER — Ambulatory Visit: Payer: 59 | Admitting: Physical Therapy

## 2014-03-18 ENCOUNTER — Ambulatory Visit: Payer: 59 | Admitting: Physical Therapy

## 2014-03-24 ENCOUNTER — Encounter: Payer: Self-pay | Admitting: Physical Therapy

## 2014-03-26 ENCOUNTER — Ambulatory Visit: Payer: 59 | Admitting: Rehabilitation

## 2014-03-31 ENCOUNTER — Encounter: Payer: Self-pay | Admitting: Physical Therapy

## 2014-04-01 ENCOUNTER — Encounter: Payer: Self-pay | Admitting: Physical Therapy

## 2014-04-06 ENCOUNTER — Ambulatory Visit: Payer: 59 | Admitting: Physical Therapy

## 2014-04-07 ENCOUNTER — Encounter: Payer: Self-pay | Admitting: Physical Therapy

## 2014-04-23 ENCOUNTER — Emergency Department (HOSPITAL_COMMUNITY): Payer: 59

## 2014-04-23 ENCOUNTER — Emergency Department (HOSPITAL_COMMUNITY)
Admission: EM | Admit: 2014-04-23 | Discharge: 2014-04-23 | Disposition: A | Discharge status: Home / Self Care | Payer: 59 | Attending: Emergency Medicine | Admitting: Emergency Medicine

## 2014-04-23 ENCOUNTER — Encounter (HOSPITAL_COMMUNITY): Payer: Self-pay | Admitting: Emergency Medicine

## 2014-04-23 DIAGNOSIS — Z87891 Personal history of nicotine dependence: Secondary | ICD-10-CM | POA: Insufficient documentation

## 2014-04-23 DIAGNOSIS — M545 Low back pain, unspecified: Secondary | ICD-10-CM | POA: Insufficient documentation

## 2014-04-23 DIAGNOSIS — W07XXXA Fall from chair, initial encounter: Secondary | ICD-10-CM | POA: Insufficient documentation

## 2014-04-23 DIAGNOSIS — S99919A Unspecified injury of unspecified ankle, initial encounter: Secondary | ICD-10-CM | POA: Insufficient documentation

## 2014-04-23 DIAGNOSIS — M79671 Pain in right foot: Secondary | ICD-10-CM

## 2014-04-23 DIAGNOSIS — Z79899 Other long term (current) drug therapy: Secondary | ICD-10-CM | POA: Insufficient documentation

## 2014-04-23 DIAGNOSIS — Z8639 Personal history of other endocrine, nutritional and metabolic disease: Secondary | ICD-10-CM | POA: Insufficient documentation

## 2014-04-23 DIAGNOSIS — S99929A Unspecified injury of unspecified foot, initial encounter: Secondary | ICD-10-CM

## 2014-04-23 DIAGNOSIS — Z880 Allergy status to penicillin: Secondary | ICD-10-CM | POA: Insufficient documentation

## 2014-04-23 DIAGNOSIS — S79919A Unspecified injury of unspecified hip, initial encounter: Secondary | ICD-10-CM | POA: Insufficient documentation

## 2014-04-23 DIAGNOSIS — M25552 Pain in left hip: Secondary | ICD-10-CM

## 2014-04-23 DIAGNOSIS — Z862 Personal history of diseases of the blood and blood-forming organs and certain disorders involving the immune mechanism: Secondary | ICD-10-CM | POA: Insufficient documentation

## 2014-04-23 DIAGNOSIS — Y9389 Activity, other specified: Secondary | ICD-10-CM | POA: Insufficient documentation

## 2014-04-23 DIAGNOSIS — S8990XA Unspecified injury of unspecified lower leg, initial encounter: Secondary | ICD-10-CM | POA: Insufficient documentation

## 2014-04-23 DIAGNOSIS — Y9289 Other specified places as the place of occurrence of the external cause: Secondary | ICD-10-CM | POA: Insufficient documentation

## 2014-04-23 DIAGNOSIS — S79929A Unspecified injury of unspecified thigh, initial encounter: Principal | ICD-10-CM

## 2014-04-23 NOTE — ED Provider Notes (Signed)
CSN: [id number]     Arrival date & time 04/23/14  3267 History   First MD Initiated Contact with Patient 04/23/14 1039     Chief Complaint  Patient presents with  . Fall    fell backwards off a chair 2 days ago  . Foot Pain    r/foot pain  . Hip Pain    l/hip pain     (Consider location/radiation/quality/duration/timing/severity/associated sxs/prior Treatment) HPI  Patient presents with left hip pain and right foot pain that began 2 days ago after fall from chair. States that she was sitting in a dining room table chair when she fell she hurt her B. and tried to get away from it and fell backwards. Denies hitting her head or passing out. Denies any other pain. Pain is worse than her left hip and is located only in the left hip without radiation, exacerbated by abduction.  She does have mild lumbar pain that is always present and is unchanged from her baseline. Also has pain in her right 1st MTP joint that began after the fall.  Had surgery on that foot in November of last year with slow healing.  Denies fevers, chills, abdominal pain, loss of control of bowel or bladder, weakness of numbness of the extremities, saddle anesthesia, bowel, urinary, or vaginal complaints.    Past Medical History  Diagnosis Date  . Spinal stenosis   . Thyroid disease    Past Surgical History  Procedure Laterality Date  . Abdominal hysterectomy    . Foot surgery     Family History  Problem Relation Age of Onset  . Lupus Mother   . Cancer Other    History  Substance Use Topics  . Smoking status: Former Research scientist (life sciences)  . Smokeless tobacco: Not on file  . Alcohol Use: No   OB History   Grav Para Term Preterm Abortions TAB SAB Ect Mult Living                 Review of Systems  All other systems reviewed and are negative.     Allergies  Contrast media; Penicillins; and Prednisone  Home Medications   Prior to Admission medications   Medication Sig Start Date End Date Taking? Authorizing Provider   ALMOTRIPTAN MALATE PO Take 2 tablets by mouth 2 (two) times daily.   Yes Historical Provider, MD  Cholecalciferol (VITAMIN D-3) 1000 UNITS CAPS Take 2,000 Units by mouth daily.   Yes Historical Provider, MD  magnesium oxide (MAG-OX) 400 MG tablet Take 400 mg by mouth daily.   Yes Historical Provider, MD  Multiple Vitamin (MULTIVITAMIN WITH MINERALS) TABS Take 1 tablet by mouth daily. [facility]   Yes Historical Provider, MD  naphazoline-pheniramine (ALLERGY EYE) 0.025-0.3 % ophthalmic solution Place 2 drops into both eyes daily as needed for allergies.   Yes Historical Provider, MD   BP 135/77  Pulse 62  Temp(Src) 98.2 F (36.8 C) (Oral)  Resp 20  Wt 233 lb (105.688 kg)  SpO2 100% Physical Exam  Nursing note and vitals reviewed. Constitutional: She appears well-developed and well-nourished. No distress.  HENT:  Head: Normocephalic and atraumatic.  Neck: Neck supple.  Pulmonary/Chest: Effort normal.  Musculoskeletal:  Spine nontender, no crepitus, or stepoffs.  Lower extremities:  Strength 5/5, sensation intact, distal pulses intact, capillary refill < 2 seconds.   Mild tenderness in left lateral hip, pain with passive abduction.   Right 1st MTP tender without erythema, edema, warmth.    Neurological: She is alert.  Skin: She is not diaphoretic.    ED Course  Procedures Labs Reviewed - No data to display  Imaging Review No results found.   EKG Interpretation None      Pt declines pain medication  MDM   Final diagnoses:  Left hip pain  Right foot pain  Fall from chair    Patient presents with 2 days of left hip and right foot pain after falling backwards out of chair accidentally neurovascularly intact. X-rays negative.  D/C home with PCP and ortho follow up.  Pt declined pain medication in ED.  Discussed result, findings, treatment, and follow up  with patient.  Pt given return precautions.  Pt verbalizes understanding and agrees with plan.       I doubt any  other EMC precluding discharge at this time including, but not necessarily limited to the following: septic joint, gout    Clayton Bibles, PA-C 04/23/14 1128

## 2014-04-23 NOTE — ED Notes (Signed)
Pt reports l/hip pain and r/foot pain. Pt fell backwards off a chair 2 days ago. Pt struck l./hip and turned r/foot. Increased pain and deaased mobility since incident . Denies LOC

## 2014-04-23 NOTE — Discharge Instructions (Signed)
Read the information below.  You may return to the Emergency Department at any time for worsening condition or any new symptoms that concern you.  If you develop uncontrolled pain, weakness or numbness of the extremity, severe discoloration of the skin, or you are unable to walk, return to the ER for a recheck.      Hip Pain The hips join the upper legs to the lower pelvis. The bones, cartilage, tendons, and muscles of the hip joint perform a lot of work each day holding your body weight and allowing you to move around. Hip pain is a common symptom. It can range from a minor ache to severe pain on 1 or both hips. Pain may be felt on the inside of the hip joint near the groin, or the outside near the buttocks and upper thigh. There may be swelling or stiffness as well. It occurs more often when a person walks or performs activity. There are many reasons hip pain can develop. CAUSES  It is important to work with your caregiver to identify the cause since many conditions can impact the bones, cartilage, muscles, and tendons of the hips. Causes for hip pain include:  Broken (fractured) bones.  Separation of the thighbone from the hip socket (dislocation).  Torn cartilage of the hip joint.  Swelling (inflammation) of a tendon (tendonitis), the sac within the hip joint (bursitis), or a joint.  A weakening in the abdominal wall (hernia), affecting the nerves to the hip.  Arthritis in the hip joint or lining of the hip joint.  Pinched nerves in the back, hip, or upper thigh.  A bulging disc in the spine (herniated disc).  Rarely, bone infection or cancer. DIAGNOSIS  The location of your hip pain will help your caregiver understand what may be causing the pain. A diagnosis is based on your medical history, your symptoms, results from your physical exam, and results from diagnostic tests. Diagnostic tests may include X-ray exams, a computerized magnetic scan (magnetic resonance imaging, MRI), or  bone scan. TREATMENT  Treatment will depend on the cause of your hip pain. Treatment may include:  Limiting activities and resting until symptoms improve.  Crutches or other walking supports (a cane or brace).  Ice, elevation, and compression.  Physical therapy or home exercises.  Shoe inserts or special shoes.  Losing weight.  Medications to reduce pain.  Undergoing surgery. HOME CARE INSTRUCTIONS   Only take over-the-counter or prescription medicines for pain, discomfort, or fever as directed by your caregiver.  Put ice on the injured area:  Put ice in a plastic bag.  Place a towel between your skin and the bag.  Leave the ice on for 15-20 minutes at a time, 03-04 times a day.  Keep your leg raised (elevated) when possible to lessen swelling.  Avoid activities that cause pain.  Follow specific exercises as directed by your caregiver.  Sleep with a pillow between your legs on your most comfortable side.  Record how often you have hip pain, the location of the pain, and what it feels like. This information may be helpful to you and your caregiver.  Ask your caregiver about returning to work or sports and whether you should drive.  Follow up with your caregiver for further exams, therapy, or testing as directed. SEEK MEDICAL CARE IF:   Your pain or swelling continues or worsens after 1 week.  You are feeling unwell or have chills.  You have increasing difficulty with walking.  You have  a loss of sensation or other new symptoms.  You have questions or concerns. SEEK IMMEDIATE MEDICAL CARE IF:   You cannot put weight on the affected hip.  You have fallen.  You have a sudden increase in pain and swelling in your hip.  You have a fever. MAKE SURE YOU:   Understand these instructions.  Will watch your condition.  Will get help right away if you are not doing well or get worse. Document Released: 05/17/2010 Document Revised: 02/19/2012 Document  Reviewed: 05/17/2010 [facility] Patient Information 2014 [place].  Musculoskeletal Pain Musculoskeletal pain is muscle and boney aches and pains. These pains can occur in any part of the body. Your caregiver may treat you without knowing the cause of the pain. They may treat you if blood or urine tests, X-rays, and other tests were normal.  CAUSES There is often not a definite cause or reason for these pains. These pains may be caused by a type of germ (virus). The discomfort may also come from overuse. Overuse includes working out too hard when your body is not fit. Boney aches also come from weather changes. Bone is sensitive to atmospheric pressure changes. HOME CARE INSTRUCTIONS   Ask when your test results will be ready. Make sure you get your test results.  Only take over-the-counter or prescription medicines for pain, discomfort, or fever as directed by your caregiver. If you were given medications for your condition, do not drive, operate machinery or power tools, or sign legal documents for 24 hours. Do not drink alcohol. Do not take sleeping pills or other medications that may interfere with treatment.  Continue all activities unless the activities cause more pain. When the pain lessens, slowly resume normal activities. Gradually increase the intensity and duration of the activities or exercise.  During periods of severe pain, bed rest may be helpful. Lay or sit in any position that is comfortable.  Putting ice on the injured area.  Put ice in a bag.  Place a towel between your skin and the bag.  Leave the ice on for 15 to 20 minutes, 3 to 4 times a day.  Follow up with your caregiver for continued problems and no reason can be found for the pain. If the pain becomes worse or does not go away, it may be necessary to repeat tests or do additional testing. Your caregiver may need to look further for a possible cause. SEEK IMMEDIATE MEDICAL CARE IF:  You have pain that is  getting worse and is not relieved by medications.  You develop chest pain that is associated with shortness or breath, sweating, feeling sick to your stomach (nauseous), or throw up (vomit).  Your pain becomes localized to the abdomen.  You develop any new symptoms that seem different or that concern you. MAKE SURE YOU:   Understand these instructions.  Will watch your condition.  Will get help right away if you are not doing well or get worse. Document Released: 11/27/2005 Document Revised: 02/19/2012 Document Reviewed: 08/01/2013 [facility] Patient Information 2014 [place].

## 2014-04-23 NOTE — ED Provider Notes (Signed)
Medical screening examination/treatment/procedure(s) were performed by non-physician practitioner and as supervising physician I was immediately available for consultation/collaboration.  Megan E Docherty, MD 04/23/14 2051 

## 2014-11-26 ENCOUNTER — Emergency Department (HOSPITAL_COMMUNITY)
Admission: EM | Admit: 2014-11-26 | Discharge: 2014-11-26 | Disposition: A | Discharge status: Home / Self Care | Payer: 59 | Attending: Emergency Medicine | Admitting: Emergency Medicine

## 2014-11-26 ENCOUNTER — Encounter (HOSPITAL_COMMUNITY): Payer: Self-pay | Admitting: Emergency Medicine

## 2014-11-26 ENCOUNTER — Emergency Department (HOSPITAL_COMMUNITY): Payer: 59

## 2014-11-26 DIAGNOSIS — Z79899 Other long term (current) drug therapy: Secondary | ICD-10-CM | POA: Diagnosis not present

## 2014-11-26 DIAGNOSIS — R531 Weakness: Secondary | ICD-10-CM

## 2014-11-26 DIAGNOSIS — M542 Cervicalgia: Secondary | ICD-10-CM | POA: Diagnosis not present

## 2014-11-26 DIAGNOSIS — Z880 Allergy status to penicillin: Secondary | ICD-10-CM | POA: Diagnosis not present

## 2014-11-26 DIAGNOSIS — M47899 Other spondylosis, site unspecified: Secondary | ICD-10-CM

## 2014-11-26 DIAGNOSIS — M4802 Spinal stenosis, cervical region: Secondary | ICD-10-CM

## 2014-11-26 DIAGNOSIS — R29898 Other symptoms and signs involving the musculoskeletal system: Secondary | ICD-10-CM

## 2014-11-26 DIAGNOSIS — Z8639 Personal history of other endocrine, nutritional and metabolic disease: Secondary | ICD-10-CM | POA: Insufficient documentation

## 2014-11-26 DIAGNOSIS — Z3202 Encounter for pregnancy test, result negative: Secondary | ICD-10-CM | POA: Insufficient documentation

## 2014-11-26 DIAGNOSIS — M1990 Unspecified osteoarthritis, unspecified site: Secondary | ICD-10-CM | POA: Diagnosis not present

## 2014-11-26 DIAGNOSIS — M6281 Muscle weakness (generalized): Secondary | ICD-10-CM | POA: Diagnosis present

## 2014-11-26 LAB — BASIC METABOLIC PANEL
Anion gap: 11 (ref 5–15)
BUN: 8 mg/dL (ref 6–23)
CO2: 24 mEq/L (ref 19–32)
Calcium: 10.8 mg/dL — ABNORMAL HIGH (ref 8.4–10.5)
Chloride: 102 mEq/L (ref 96–112)
Creatinine, Ser: 0.86 mg/dL (ref 0.50–1.10)
GFR calc Af Amer: 85 mL/min — ABNORMAL LOW (ref >90)
GFR calc non Af Amer: 73 mL/min — ABNORMAL LOW (ref >90)
Glucose, Bld: 93 mg/dL (ref 70–99)
Potassium: 4.1 mEq/L (ref 3.7–5.3)
Sodium: 137 mEq/L (ref 137–147)

## 2014-11-26 LAB — URINALYSIS, ROUTINE W REFLEX MICROSCOPIC
Bilirubin Urine: NEGATIVE
Glucose, UA: NEGATIVE mg/dL
Hgb urine dipstick: NEGATIVE
Ketones, ur: NEGATIVE mg/dL
Leukocytes, UA: NEGATIVE
Nitrite: NEGATIVE
Protein, ur: NEGATIVE mg/dL
Specific Gravity, Urine: 1.009 (ref 1.005–1.030)
Urobilinogen, UA: 0.2 mg/dL (ref 0.0–1.0)
pH: 7.5 (ref 5.0–8.0)

## 2014-11-26 LAB — CBC WITH DIFFERENTIAL/PLATELET
Basophils Absolute: 0 10*3/uL (ref 0.0–0.1)
Basophils Relative: 0 % (ref 0–1)
Eosinophils Absolute: 0.1 10*3/uL (ref 0.0–0.7)
Eosinophils Relative: 1 % (ref 0–5)
HCT: 38.6 % (ref 36.0–46.0)
Hemoglobin: 12.8 g/dL (ref 12.0–15.0)
Lymphocytes Relative: 24 % (ref 12–46)
Lymphs Abs: 1.8 10*3/uL (ref 0.7–4.0)
MCH: 29.8 pg (ref 26.0–34.0)
MCHC: 33.2 g/dL (ref 30.0–36.0)
MCV: 90 fL (ref 78.0–100.0)
Monocytes Absolute: 0.4 10*3/uL (ref 0.1–1.0)
Monocytes Relative: 5 % (ref 3–12)
Neutro Abs: 5.3 10*3/uL (ref 1.7–7.7)
Neutrophils Relative %: 70 % (ref 43–77)
Platelets: 199 10*3/uL (ref 150–400)
RBC: 4.29 MIL/uL (ref 3.87–5.11)
RDW: 13.9 % (ref 11.5–15.5)
WBC: 7.6 10*3/uL (ref 4.0–10.5)

## 2014-11-26 LAB — POC URINE PREG, ED: Preg Test, Ur: NEGATIVE

## 2014-11-26 MED ORDER — OXYCODONE-ACETAMINOPHEN 5-325 MG PO TABS: 1.0000 | ORAL_TABLET | Freq: Four times a day (QID) | ORAL | Status: DC | PRN

## 2014-11-26 NOTE — ED Notes (Signed)
Pt A+Ox4, reports sat down yesterday and bent forward "and felt something pop", c/o mid back pain since, 8/10.  Pt reports hx spinal stenosis but this pain is different.  Pt also reports LLE weakness and numbness xfew days.  Pt reports seen by PCP yesterday "and she told me to follow up with the orthopedist".  Pt reports headaches daily x1 week, no hx of same.  Pt denies cp/palpitations or SOB.  MAEI, ambulatory with steady gait.  Skin PWD.  Speaking full/clear sentences, rr even/un-lab.

## 2014-11-26 NOTE — ED Notes (Signed)
Pt states she urinated before coming to ER, unable to provide sample at this time.

## 2014-11-26 NOTE — ED Provider Notes (Signed)
CSN: [id number]     Arrival date & time 11/26/14  1054 History   First MD Initiated Contact with Patient 11/26/14 1103     Chief Complaint  Patient presents with  . Back Pain    "felt something pop", x1 day  . Extremity Weakness    L leg xfew days  . Numbness    L leg, also bilat hands/feet, xfew days     (Consider location/radiation/quality/duration/timing/severity/associated sxs/prior Treatment) HPI Comments: Patient reports 2 day history of weakness in her left leg with numbness and tingling. She reports intermittent numbness and tingling in her left arm ongoing for several days. Yesterday she sat down forcefully and felt a pop in her mid back area has been painful ever since. This does not radiate. She denies any bowel or bladder incontinence. No headache, chest pain, shortness of breath, abdominal pain or vomiting. Reports history of spinal stenosis and neck no previous surgery. She also has a history of thyroid problems. Denies any IV drug use or history of cancer. The pain is worse with movement and better with rest.  The history is provided by the patient.    Past Medical History  Diagnosis Date  . Spinal stenosis   . Thyroid disease    Past Surgical History  Procedure Laterality Date  . Abdominal hysterectomy    . Foot surgery     Family History  Problem Relation Age of Onset  . Lupus Mother   . Cancer Other    History  Substance Use Topics  . Smoking status: Former Research scientist (life sciences)  . Smokeless tobacco: Not on file  . Alcohol Use: No   OB History    No data available     Review of Systems  Constitutional: Negative for fever, activity change and appetite change.  HENT: Negative for congestion and rhinorrhea.   Eyes: Negative for visual disturbance.  Respiratory: Negative for cough, chest tightness and shortness of breath.   Cardiovascular: Negative for chest pain.  Gastrointestinal: Negative for nausea, vomiting and abdominal pain.  Genitourinary: Negative for  dysuria, hematuria, vaginal bleeding and vaginal discharge.  Musculoskeletal: Positive for back pain, extremity weakness and neck pain.  Skin: Negative for rash.  Neurological: Positive for weakness and numbness. Negative for dizziness, seizures, speech difficulty and headaches.  A complete 10 system review of systems was obtained and all systems are negative except as noted in the HPI and PMH.      Allergies  Contrast media; Penicillins; and Prednisone  Home Medications   Prior to Admission medications   Medication Sig Start Date End Date Taking? Authorizing Provider  Amino Acids (AMINO ACID PO) Take 1 tablet by mouth 2 (two) times daily.   Yes Historical Provider, MD  Cholecalciferol (VITAMIN D-3) 1000 UNITS CAPS Take 5,000 Units by mouth daily.    Yes Historical Provider, MD  Digestive Enzyme CAPS Take 1 capsule by mouth 3 (three) times daily with meals.   Yes Historical Provider, MD  magnesium oxide (MAG-OX) 400 MG tablet Take 400 mg by mouth daily.   Yes Historical Provider, MD  Multiple Vitamin (MULTIVITAMIN WITH MINERALS) TABS Take 1 tablet by mouth daily. [facility] OTC   Yes Historical Provider, MD  naphazoline-pheniramine (ALLERGY EYE) 0.025-0.3 % ophthalmic solution Place 2 drops into both eyes daily as needed for allergies.   Yes Historical Provider, MD   BP 161/86 mmHg  Pulse 67  Temp(Src) 98.4 F (36.9 C) (Oral)  Resp 20  Ht 5\' 5"  (1.651 m)  Wt 238  lb (107.956 kg)  BMI 39.61 kg/m2  SpO2 100% Physical Exam  Constitutional: She is oriented to person, place, and time. She appears well-developed and well-nourished. No distress.  HENT:  Head: Normocephalic and atraumatic.  Mouth/Throat: Oropharynx is clear and moist. No oropharyngeal exudate.  Eyes: Conjunctivae and EOM are normal. Pupils are equal, round, and reactive to light.  Neck: Normal range of motion. Neck supple.  No meningismus.  Cardiovascular: Normal rate, regular rhythm, normal heart sounds and intact distal  pulses.   No murmur heard. Pulmonary/Chest: Effort normal and breath sounds normal. No respiratory distress.  Abdominal: Soft. There is no tenderness. There is no rebound and no guarding.  Musculoskeletal: Normal range of motion. She exhibits tenderness. She exhibits no edema.  TTP lower thoracic and upper lumbar spine, no stepoff or deformity  Neurological: She is alert and oriented to person, place, and time. No cranial nerve deficit. She exhibits normal muscle tone. Coordination normal.  4/5 strength LUE and LLE.  CN 2-12 intact, no ataxia on finger to nose. Great toe extension weak on L. No pronator drift.  5/5 strength on R side. Unable to elicit patella reflexes bilaterally.  Skin: Skin is warm.  Psychiatric: She has a normal mood and affect. Her behavior is normal.  Nursing note and vitals reviewed.   ED Course  Procedures (including critical care time) Labs Review Labs Reviewed  URINALYSIS, ROUTINE W REFLEX MICROSCOPIC  POC URINE PREG, ED     Imaging Review No results found.   EKG Interpretation None      MDM   Final diagnoses:  Left leg weakness  3 days of LLE weakness, lower thoracic and upper lumbar back pain yesterday and sitting and feeling "pop".  ON exam has LUE weakness which patient was not aware of.  Concern for foraminal stenosis or impingement causing patient's left leg pain. However she also has left arm weakness. She was not aware of this. No cranial nerve deficit. No neck pain or headache.  Case discussed with Dr. Nevada Crane of radiology. He agrees lumbar imaging of area of pain to include lower thoracic spine. We'll also obtain MRI brain to rule out stroke. Patient has no neck pain.She has a history of cervical spinal stenosis.  Patient declines pain medication.   MRIs pending at time of sign out to Dr. Canary Brim.    Ezequiel Essex, MD 11/26/14 [phone]

## 2014-11-26 NOTE — Discharge Instructions (Signed)
Return to the ED with any concerns including weakness of legs, not able to urinate, loss of control of bowel or bladder, fever/chills, weakness of arm, changes in vision or speech, decreased level of alertness/lethargy, or any other alarming symptoms

## 2014-11-26 NOTE — ED Notes (Signed)
MD at bedside. 

## 2014-11-26 NOTE — ED Provider Notes (Signed)
5:28 PM I have seen and evaluated patient, reviewed all results with her in detail.  She is moving left upper extremity and using it with ease, no focal weakness on exam- she states she feels numbness/tingling in the left upper extremity and has "forever".  No urinary incontinence or retention, no bowel incontinence.  No fevers.  I will give her information to f/u with neurosurgery Dr. Vertell Limber, or she can followup with her Neurosurgeon at [facility].  She is agreeable with this plan.    Threasa Beards, MD 11/26/14 [phone]

## 2014-11-26 NOTE — ED Notes (Signed)
Patient transported to MRI 

## 2014-12-05 ENCOUNTER — Encounter (HOSPITAL_COMMUNITY): Payer: Self-pay | Admitting: Emergency Medicine

## 2014-12-05 ENCOUNTER — Emergency Department (HOSPITAL_COMMUNITY)
Admission: EM | Admit: 2014-12-05 | Discharge: 2014-12-05 | Disposition: A | Discharge status: Home / Self Care | Payer: 59 | Attending: Emergency Medicine | Admitting: Emergency Medicine

## 2014-12-05 DIAGNOSIS — Y92000 Kitchen of unspecified non-institutional (private) residence as  the place of occurrence of the external cause: Secondary | ICD-10-CM | POA: Insufficient documentation

## 2014-12-05 DIAGNOSIS — Z79899 Other long term (current) drug therapy: Secondary | ICD-10-CM | POA: Diagnosis not present

## 2014-12-05 DIAGNOSIS — X58XXXA Exposure to other specified factors, initial encounter: Secondary | ICD-10-CM | POA: Insufficient documentation

## 2014-12-05 DIAGNOSIS — Z880 Allergy status to penicillin: Secondary | ICD-10-CM | POA: Diagnosis not present

## 2014-12-05 DIAGNOSIS — T754XXA Electrocution, initial encounter: Secondary | ICD-10-CM | POA: Insufficient documentation

## 2014-12-05 DIAGNOSIS — Z8639 Personal history of other endocrine, nutritional and metabolic disease: Secondary | ICD-10-CM | POA: Insufficient documentation

## 2014-12-05 DIAGNOSIS — Y9389 Activity, other specified: Secondary | ICD-10-CM | POA: Diagnosis not present

## 2014-12-05 DIAGNOSIS — Z8739 Personal history of other diseases of the musculoskeletal system and connective tissue: Secondary | ICD-10-CM | POA: Insufficient documentation

## 2014-12-05 DIAGNOSIS — Y998 Other external cause status: Secondary | ICD-10-CM | POA: Diagnosis not present

## 2014-12-05 DIAGNOSIS — Z87891 Personal history of nicotine dependence: Secondary | ICD-10-CM | POA: Insufficient documentation

## 2014-12-05 NOTE — ED Provider Notes (Signed)
CSN: [id number]     Arrival date & time 12/05/14  1146 History  This chart was scribed for non-physician practitioner [name], PA-C working with Hoy Morn, MD by Lora Havens, ED Scribe. This patient was seen in [facility] and the patient's care was started at 12:25 PM.   Chief Complaint  Patient presents with  . Hand Pain   Patient is a 58 y.o. female presenting with hand pain. The history is provided by the patient. No language interpreter was used.  Hand Pain    HPI Comments: Meghan Owen is a 58 y.o. female who presents to the Emergency Department complaining of left index finger pain due to a shock, onset 1 day ago. She has weakness and swelling as associated symptoms. She denies any heart problems. Pt notes her BP is elevated. Pt has a hx of spinal stenosis.  Past Medical History  Diagnosis Date  . Spinal stenosis   . Thyroid disease    Past Surgical History  Procedure Laterality Date  . Abdominal hysterectomy    . Foot surgery     Family History  Problem Relation Age of Onset  . Lupus Mother   . Cancer Other    History  Substance Use Topics  . Smoking status: Former Research scientist (life sciences)  . Smokeless tobacco: Not on file  . Alcohol Use: No   OB History    No data available     Review of Systems  Neurological: Positive for weakness.  All other systems reviewed and are negative.  Allergies  Contrast media; Penicillins; and Prednisone  Home Medications   Prior to Admission medications   Medication Sig Start Date End Date Taking? Authorizing Provider  Amino Acids (AMINO ACID PO) Take 1 tablet by mouth 2 (two) times daily.    Historical Provider, MD  Cholecalciferol (VITAMIN D-3) 1000 UNITS CAPS Take 5,000 Units by mouth daily.     Historical Provider, MD  Digestive Enzyme CAPS Take 1 capsule by mouth 3 (three) times daily with meals.    Historical Provider, MD  magnesium oxide (MAG-OX) 400 MG tablet Take 400 mg by mouth daily.    Historical Provider, MD   Multiple Vitamin (MULTIVITAMIN WITH MINERALS) TABS Take 1 tablet by mouth daily. [facility] OTC    Historical Provider, MD  naphazoline-pheniramine (ALLERGY EYE) 0.025-0.3 % ophthalmic solution Place 2 drops into both eyes daily as needed for allergies.    Historical Provider, MD  oxyCODONE-acetaminophen (PERCOCET/ROXICET) 5-325 MG per tablet Take 1-2 tablets by mouth every 6 (six) hours as needed for severe pain. 11/26/14   Threasa Beards, MD   BP 142/70 mmHg  Pulse 61  Temp(Src) 98.3 F (36.8 C) (Oral)  Resp 18  SpO2 99% Physical Exam  Constitutional: She is oriented to person, place, and time. She appears well-developed and well-nourished. No distress.  HENT:  Head: Normocephalic and atraumatic.  Eyes: EOM are normal.  Neck: Normal range of motion.  Cardiovascular: Normal rate and regular rhythm.   Pulmonary/Chest: Effort normal.  Musculoskeletal:  Left hand, strength is at baseline. Minimal amount of swelling with no skin changes. Sensations intact  Neurological: She is alert and oriented to person, place, and time.  Skin: Skin is warm and dry.  Psychiatric: She has a normal mood and affect. Her behavior is normal.  Nursing note and vitals reviewed.   ED Course  Procedures  DIAGNOSTIC STUDIES: Oxygen Saturation is 99% on room air, normal by my interpretation.    COORDINATION OF CARE: 12:29  PM Discussed treatment plan with pt at bedside and pt agreed to plan.  Labs Review Labs Reviewed - No data to display  Imaging Review No results found.   EKG Interpretation   Date/Time:  Saturday December 05 2014 12:40:45 EST Ventricular Rate:  53 PR Interval:  127 QRS Duration: 84 QT Interval:  682 QTC Calculation: 640 R Axis:   68 Text Interpretation:  Sinus rhythm Low voltage, precordial leads  Borderline T abnormalities, anterior leads Prolonged QT interval Baseline  wander in lead(s) V6 No old tracing to compare Confirmed by CAMPOS  MD,  Lennette Bihari ([id number]) on 12/05/2014  12:43:47 PM      MDM   Final diagnoses:  Electric shock, initial encounter   No skin changes, normal EKG. The wattage was from a residential house/kitchen. She did not have syncope or chest pain.  I have discussed the patient with my supervising attending who is aware of my work-up and plan.  58 y.o.Meghan Owen's evaluation in the Emergency Department is complete. It has been determined that no acute conditions requiring further emergency intervention are present at this time. The patient/guardian have been advised of the diagnosis and plan. We have discussed signs and symptoms that warrant return to the ED, such as changes or worsening in symptoms.  Vital signs are stable at discharge. Filed Vitals:   12/05/14 1201  BP: 142/70  Pulse: 61  Temp: 98.3 F (36.8 C)  Resp: 18    Patient/guardian has voiced understanding and agreed to follow-up with the PCP or specialist.  I personally performed the services described in this documentation, which was scribed in my presence. The recorded information has been reviewed and is accurate.     Linus Mako, PA-C 12/05/14 [place], MD 12/05/14 [phone]

## 2014-12-05 NOTE — Discharge Instructions (Signed)
Electric Shock Injury Electric shock injuries may be caused by lightning or electricity (current) passing through the body. The amount of injury depends on the current's pressure (voltage), the amount of current (amperage), the type of current (direct vs. alternating), the body's resistance to the current, the current's path through the body, and how long the body remains in contact with the current. Current is the flow of electricity. Electricity may produce effects ranging from barely noticeable tingling to instant death; every part of the body is vulnerable.  The harshness of injury depends mostly on the voltage. Low voltage can be as dangerous as high voltage under the right circumstances. People have been killed by shocks of just 50 volts. WHAT DETERMINES THE EFFECTS OF ELECTRICITY? How electric shocks affect the skin is determined by the skin's resistance. This is the skin's ability to stay unharmed by a shock. This, in turn, depends upon the wetness, dryness, thickness and or cleanliness of the skin. Thin or wet skin is much less resistant than thick or dry skin. When skin resistance is low, the current may cause little or no skin damage but may severely burn internal organs and tissues. Conversely, high skin resistance, such as with dry thick skin, can produce severe skin burns but decreases the current entering the body. WHAT PARTS OF THE BODY DOES ELECTRICITY AFFECT THE MOST?  The nervous system (the brain, spinal cord, and nerves) are most helpless to the effects of electricity and most often harmed in electrical injury. Some damage is minor and clears up on its own or with treatment. Sometimes the damage is severe and will be permanent. Neurological problems may be apparent immediately after the accident, or gradually develop over a period of up to three years.  Damage to the respiratory and cardiovascular systems happens immediately. Electric shocks can paralyze the respiratory system (stop  breathing) or disrupt heart action (cause the heart to beat irregularly or stop). This may cause instant death. Smaller veins and arteries, which get hot more easily than the larger blood vessels, are at greater risk. They can develop blood clots. Damage to the smaller vessels is a common cause of amputation following high-voltage injuries.  Other injuries may include cataracts, kidney failure, and injury to muscle tissue. An electric arc may set clothing and flammable substances on fire which may cause burns. Strong shocks are often accompanied by violent muscle spasms that can break and dislocate bones. These spasms can also freeze the victim in place and prevent him or her from breaking away from the current. DIAGNOSIS  Diagnosis relies on information about the cause of the accident, physical examination, and close monitoring of the heart, lungs, neurological condition and kidney activity. These conditions can change rapidly so close observation is necessary. Magnetic resonance imaging (MRI) may be necessary to check for brain injury. TREATMENT   When an electrical accident happens at home or in the workplace, emergency medical help should be summoned as quickly as possible. The main power should immediately be shut off. If that cannot be done, and current is still flowing through the victim, stand on a dry, non-conducting surface such as a folded newspaper, flattened cardboard carton, or plastic or rubber mat. Use a non-conducting object such as a wooden broomstick (never a damp or metallic object) to push the victim away from the source of the current. Non-conducting means the substance will not pass electricity easily through it. Do not touch the victim or electrical source while the current is still flowing. This  may electrocute the rescuer.  If the victim is faint, pale, or showing signs of shock, lay the victim down, with the legs elevated above the level of the chest. Warm the person with a  blanket.  If a pulse can not be felt, or the person is not breathing, someone trained in cardiopulmonary resuscitation (CPR) should begin CPR. Continue this until help arrives.  If the victim is burned, remove clothing that comes off easily. Rinse the burned area in cool water for pain relief. Give first aid for burns. Burns often require treatment at a burn center.  Electrical injury can be associated with explosions or falls that can cause other injuries. Avoid moving the head or neck if an injury to this area is suspected.  Give first aid as needed for other wounds or fractures.  Fluid replacement therapy is necessary to restore lost fluids and electrolytes. Severely injured tissue is repaired surgically.  Antibiotics and antibacterial creams are used to prevent infection.  Kidney failure may need to be treated.  Physical therapy may help recovery along with counseling if there is disfigurement. PROGNOSIS   Electric shocks may cause death.  Survivors may require amputation. Cosmetic problems may result along with disfigurement.  Injuries from household appliances and other low-voltage sources are less likely to produce extreme damage. PREVENTION   Know electrical dangers in your home.  Damaged electric appliances, wiring, cords, and plugs should be repaired or replaced. Electrical repairs should be attempted only by people with the proper training.  Hair dryers, radios, and other electric appliances should never be used in the bathroom or anywhere else they might accidentally come in contact with water. Water and pipes create a ground and the electricity picks the easiest way to go to ground which can be through your body.  Young children need to be kept away from electric appliances and should be taught about the dangers of electricity as soon as they are old enough.  Electric outlets require safety covers in homes with young children.  During lightning and thunder storms, go  indoors immediately, even if no rain is falling. Boaters should return to shore as rapidly as possible.  If the hair on your head or arms stands on end during a storm, seek cover as rapidly as possible as a lightning strike may be about to happen.  If you cannot reach indoor shelter, stay away from metallic objects such as golf clubs or fishing rods and lie down in low-ground areas. Standing or lying under or next to tall or metallic structures is unsafe. For example, it is unsafe to stand under a tree during a lightning storm. Do not stand next to long conductors of electricity such as wire fences.  An automobile is appropriate cover, as long as the radio is off.  Telephones, computers, hair dryers, and other appliances that can act as channels for lightning should not be used during a thunder storm.  During storms, stay away from screens and metal that may conduct electricity from the outside. SEEK IMMEDIATE MEDICAL CARE IF:  You develop chest pain.  A part of your arms or legs becomes very swollen or painful.  One of your arms or legs appears pale, cool, or discolored.  Your urine becomes discolored, or you are not urinating as much as usual.  You develop severe abdominal pain. Document Released: 11/30/2003 Document Revised: 02/19/2012 Document Reviewed: 02/23/2014 [facility] Patient Information 2015 [place], [place]. This information is not intended to replace advice given to you by  your health care provider. Make sure you discuss any questions you have with your health care provider. ° °

## 2014-12-05 NOTE — ED Notes (Signed)
Pt states that a pot of green beans spilt over in her refrigerator. A green bean got stuck in the light shock it where she has a missing light bulb. When she tried to pull out the green bean she got shocked in her left index finger.  Pt c/o pain and swelling.

## 2014-12-15 DIAGNOSIS — G8929 Other chronic pain: Secondary | ICD-10-CM | POA: Insufficient documentation

## 2014-12-29 ENCOUNTER — Ambulatory Visit: Payer: 59 | Admitting: Physical Therapy

## 2015-01-05 ENCOUNTER — Ambulatory Visit: Payer: Self-pay | Admitting: Physical Therapy

## 2015-01-07 ENCOUNTER — Ambulatory Visit: Payer: 59 | Attending: Physician Assistant | Admitting: Physical Therapy

## 2015-01-07 DIAGNOSIS — G8929 Other chronic pain: Secondary | ICD-10-CM | POA: Insufficient documentation

## 2015-01-07 DIAGNOSIS — M545 Low back pain, unspecified: Secondary | ICD-10-CM

## 2015-01-07 DIAGNOSIS — M25552 Pain in left hip: Secondary | ICD-10-CM | POA: Insufficient documentation

## 2015-01-07 NOTE — Patient Instructions (Addendum)
Trigger Point Dry Needling  . What is Trigger Point Dry Needling (DN)? o DN is a physical therapy technique used to treat muscle pain and dysfunction. Specifically, DN helps deactivate muscle trigger points (muscle knots).  o A thin filiform needle is used to penetrate the skin and stimulate the underlying trigger point. The goal is for a local twitch response (LTR) to occur and for the trigger point to relax. No medication of any kind is injected during the procedure.   . What Does Trigger Point Dry Needling Feel Like?  o The procedure feels different for each individual patient. Some patients report that they do not actually feel the needle enter the skin and overall the process is not painful. Very mild bleeding may occur. However, many patients feel a deep cramping in the muscle in which the needle was inserted. This is the local twitch response.   Marland Kitchen How Will I feel after the treatment? o Soreness is normal, and the onset of soreness may not occur for a few hours. Typically this soreness does not last longer than two days.  o Bruising is uncommon, however; ice can be used to decrease any possible bruising.  o In rare cases feeling tired or nauseous after the treatment is normal. In addition, your symptoms may get worse before they get better, this period will typically not last longer than 24 hours.   . What Can I do After My Treatment? o Increase your hydration by drinking more water for the next 24 hours. o You may place ice or heat on the areas treated that have become sore, however, do not use heat on inflamed or bruised areas. Heat often brings more relief post needling. o You can continue your regular activities, but vigorous activity is not recommended initially after the treatment for 24 hours. o DN is best combined with other physical therapy such as strengthening, stretching, and other therapies.  Abduction: Clam (Eccentric) - Side-Lying   Lie on side with knees bent. Lift top  knee, keeping feet together. Keep trunk steady. Slowly lower for 3-5 seconds. _10__ reps per set, __1_ sets per day, ___7 days per week. Add ___ lbs when you achieve ___ repetitions.  Copyright  VHI. All rights reserved.

## 2015-01-07 NOTE — Therapy (Addendum)
[place], [place], [address] Phone: [phone]   Fax:  [phone]  Physical Therapy Evaluation/Discharge Summary  Patient Details  Name: Meghan Owen MRN: [id number] Date of Birth: Apr 24, 1956 Referring Provider:  Burton Apley*  Encounter Date: 01/07/2015      PT End of Session - 01/07/15 1454    Visit Number 1   Number of Visits 16   Date for PT Re-Evaluation 03/04/15   PT Start Time 1100   PT Stop Time 1145   PT Time Calculation (min) 45 min   Activity Tolerance Patient tolerated treatment well      Past Medical History  Diagnosis Date  . Spinal stenosis   . Thyroid disease     Past Surgical History  Procedure Laterality Date  . Abdominal hysterectomy    . Foot surgery      There were no vitals taken for this visit.  Visit Diagnosis:  Left hip pain  Chronic low back pain      Subjective Assessment - 01/07/15 1057    Symptoms 1 year had right foot surgery/osteotomy and was non-weight bearing and used a walker for 3 months.  As a result began having left groin pain and some lateral hip pain as well.     Pertinent History history of lumbar DDD; went to chiro;  started working out with low impact ex on video and it seems to be helping;  has orthotics to help with foot pain (made by [facility])   Limitations Walking   How long can you sit comfortably? varies   How long can you walk comfortably? 30 min walk   Diagnostic tests x-rays negative for arthritis but was told it could be a torn labrum     Currently in Pain? Yes   Pain Score 5    Pain Location Hip   Pain Orientation Left   Pain Type Chronic pain   Pain Onset More than a month ago   Aggravating Factors  walking; hip abduction;  left sidelying (causes whole leg numbness)   Pain Relieving Factors hot baths, exercises          [facility] PT Assessment - 01/07/15 1106    Assessment   Medical Diagnosis left hip pain    Onset Date --  1 year ago   Next MD Visit Feb 16   Prior Therapy after foot surgery   Precautions   Precautions None   Restrictions   Weight Bearing Restrictions No   Balance Screen   Has the patient fallen in the past 6 months No   Has the patient had a decrease in activity level because of a fear of falling?  No   Is the patient reluctant to leave their home because of a fear of falling?  No   Home Environment   Living Enviornment Private residence   Living Arrangements Spouse/significant other   Type of [place] to enter   Prior Function   Level of Independence Independent with basic ADLs   Leisure --  helping husband with his health problems   Functional Tests   Functional tests --  Supine leg length:  right 92cm ASIS to lat mall, left 95 cm    Posture/Postural Control   Posture/Postural Control Postural limitations   Postural Limitations Right pelvic obliquity   Posture Comments --  Right iliac crest much lower than right   AROM   Right Hip Extension 10  Right Hip Flexion 110   Right Hip External Rotation  40   Right Hip Internal Rotation  15   Right Hip ABduction 10   Left Hip Extension 5   Left Hip Flexion 110   Left Hip External Rotation  30   Left Hip Internal Rotation  5  very painful   Left Hip ABduction 5   PROM   Left Hip Internal Rotation  --  Painful in 11:00 position   Strength   Right Hip ABduction 2+/5   Left Hip ABduction 2+/5   Palpation   Palpation --  Tender left gluteals,piriformis,lateral thigh   Special Tests   Hip Special Tests  Hip Scouring   Hip Scouring   Findings Positive   Side Left   Comments 11:00 IR                          PT Education - 01/07/15 1454    Education provided Yes   Education Details dry needling info; clams   Person(s) Educated Patient;Spouse   Methods Explanation;Demonstration   Comprehension Verbalized understanding;Returned demonstration          PT Short  Term Goals - 01/07/15 1740    PT SHORT TERM GOAL #1   Title "Independent with initial HEP   Time 4   Period Weeks   Status New   PT SHORT TERM GOAL #2   Title Patient will report a 25% improvement getting in/out of the car and with walking short community distances.   Time 4   Period Weeks   Status New   PT SHORT TERM GOAL #3   Title Left hip abduction strength increased to 3-/5 needed for standing and walking longer periods of time.   Time 4   Period Weeks   Status New           PT Long Term Goals - 01/07/15 1742    PT LONG TERM GOAL #1   Title "Pt will be independent with advanced HEP   Time 8   Period Weeks   Status New   PT LONG TERM GOAL #2   Title Hip abduction strength on left improved to 4/5 needed for standing and walking medium community distances with less pain.   Time 8   Period Weeks   Status New   PT LONG TERM GOAL #3   Title Patient will report a 50% improvement in overall pain and function with ADLS.   Time 8   Period Weeks   Status New   PT LONG TERM GOAL #4   Title LEFS Lower Extremity Functional Scale improved by 8 points indicating improved pain and function.   Baseline to be done next visit   Time 8   Period Weeks   Status New               Plan - 01/07/15 1729    Clinical Impression Statement The patient is a pleasant 59 year old who reports having right foot surgery last year and was NWB/limited weight bearing for several months.  As a result, she reports the onset of left groin pain and left lateral hip pain.  She states she has difficulty walking longer distances and is quite painful getting in/out of the car.  It is painful to sleep on her left side.  She is better with hot baths and she has recently begun an exercise video which seems to help too.  In standing her right  iliac crest is noticeably lower than left.  In supine there is a 3 cm leg length discrepancy (right 92 cm, left 95 cm).  She has orthotics from [facility] and plans to  discuss further modifications to correct for the discrepancy.  Tenderness and trigger points noted left gluteals, piriformis, vastus lateralis.  Very painful with left hip IR in the 11:00 position.  Otherwise left hip ROM within 5-10 degrees of right hip.  Marked weakness of bilateral hip abductors 2+/5.  Patient would benefit from PT to address these deficits.     Pt will benefit from skilled therapeutic intervention in order to improve on the following deficits Pain;Increased muscle spasms;Decreased strength;Decreased mobility;Decreased activity tolerance;Decreased range of motion   Rehab Potential Good   PT Frequency 2x / week   PT Duration 8 weeks   PT Treatment/Interventions ADLs/Self Care Home Management;Cryotherapy;Electrical Stimulation;Moist Heat;Ultrasound;Therapeutic exercise;Neuromuscular re-education;Patient/family education;Manual techniques;Dry needling  ionto with dexamethasone 4 mg/ml   PT Next Visit Plan Do LEFS; Patient considering dry needling to left gluteals, piriformis and vastus lateralis; soft tissue mob, joint mobs; check HEP (clams) and progress; possible ionto if cert signed; heat/e-stim       PHYSICAL THERAPY DISCHARGE SUMMARY  Visits from Start of Care: 1  Current functional level related to goals / functional outcomes: The patient did not return after initial evaluation.  She cancelled all appointments secondary to illness.  Her chart has been inactive and will therefore discharge from PT.   Remaining deficits: As above   Education / Equipment: Basic self care  Plan: Patient agrees to discharge.  Patient goals were not met. Patient is being discharged due to not returning since the last visit.  ?????      Problem List Patient Active Problem List   Diagnosis Date Noted  . GERD 02/05/2009  . OTHER BURSITIS DISORDERS 02/05/2009  . ABNORMAL ELECTROCARDIOGRAM 02/05/2009    Alvera Singh 01/07/2015, 5:49 PM  [facility] [address] [place], [place], [address] Phone: [phone]   Fax:  [phone]   Ruben Im, [place] 01/07/2015 5:49 PM Phone: [phone] Fax: [phone]

## 2015-01-12 ENCOUNTER — Ambulatory Visit: Payer: 59 | Admitting: Physical Therapy

## 2015-01-14 ENCOUNTER — Ambulatory Visit: Payer: 59 | Admitting: Physical Therapy

## 2015-01-18 ENCOUNTER — Ambulatory Visit: Payer: 59 | Admitting: Rehabilitation

## 2015-01-20 ENCOUNTER — Ambulatory Visit: Payer: 59 | Admitting: Physical Therapy

## 2015-01-25 ENCOUNTER — Ambulatory Visit: Payer: 59 | Admitting: Physical Therapy

## 2015-01-30 ENCOUNTER — Encounter (HOSPITAL_COMMUNITY): Payer: Self-pay | Admitting: Emergency Medicine

## 2015-01-30 ENCOUNTER — Emergency Department (HOSPITAL_COMMUNITY)
Admission: EM | Admit: 2015-01-30 | Discharge: 2015-01-30 | Disposition: A | Discharge status: Home / Self Care | Payer: 59 | Attending: Emergency Medicine | Admitting: Emergency Medicine

## 2015-01-30 ENCOUNTER — Emergency Department (HOSPITAL_COMMUNITY): Payer: 59

## 2015-01-30 DIAGNOSIS — Z79899 Other long term (current) drug therapy: Secondary | ICD-10-CM | POA: Insufficient documentation

## 2015-01-30 DIAGNOSIS — Z8639 Personal history of other endocrine, nutritional and metabolic disease: Secondary | ICD-10-CM | POA: Diagnosis not present

## 2015-01-30 DIAGNOSIS — Z880 Allergy status to penicillin: Secondary | ICD-10-CM | POA: Insufficient documentation

## 2015-01-30 DIAGNOSIS — Z87891 Personal history of nicotine dependence: Secondary | ICD-10-CM | POA: Diagnosis not present

## 2015-01-30 DIAGNOSIS — Z791 Long term (current) use of non-steroidal anti-inflammatories (NSAID): Secondary | ICD-10-CM | POA: Diagnosis not present

## 2015-01-30 DIAGNOSIS — M549 Dorsalgia, unspecified: Secondary | ICD-10-CM | POA: Insufficient documentation

## 2015-01-30 DIAGNOSIS — M25552 Pain in left hip: Secondary | ICD-10-CM | POA: Diagnosis not present

## 2015-01-30 DIAGNOSIS — G8929 Other chronic pain: Secondary | ICD-10-CM | POA: Insufficient documentation

## 2015-01-30 DIAGNOSIS — M25559 Pain in unspecified hip: Secondary | ICD-10-CM

## 2015-01-30 MED ORDER — MELOXICAM 15 MG PO TABS: 15.0000 mg | ORAL_TABLET | Freq: Every day | ORAL | Status: DC

## 2015-01-30 NOTE — Discharge Instructions (Signed)
Hip Pain Your hip is the joint between your upper legs and your lower pelvis. The bones, cartilage, tendons, and muscles of your hip joint perform a lot of work each day supporting your body weight and allowing you to move around. Hip pain can range from a minor ache to severe pain in one or both of your hips. Pain may be felt on the inside of the hip joint near the groin, or the outside near the buttocks and upper thigh. You may have swelling or stiffness as well.  HOME CARE INSTRUCTIONS   Take medicines only as directed by your health care provider.  Apply ice to the injured area:  Put ice in a plastic bag.  Place a towel between your skin and the bag.  Leave the ice on for 15-20 minutes at a time, 3-4 times a day.  Keep your leg raised (elevated) when possible to lessen swelling.  Avoid activities that cause pain.  Follow specific exercises as directed by your health care provider.  Sleep with a pillow between your legs on your most comfortable side.  Record how often you have hip pain, the location of the pain, and what it feels like. SEEK MEDICAL CARE IF:   You are unable to put weight on your leg.  Your hip is red or swollen or very tender to touch.  Your pain or swelling continues or worsens after 1 week.  You have increasing difficulty walking.  You have a fever. SEEK IMMEDIATE MEDICAL CARE IF:   You have fallen.  You have a sudden increase in pain and swelling in your hip. MAKE SURE YOU:   Understand these instructions.  Will watch your condition.  Will get help right away if you are not doing well or get worse. Document Released: 05/17/2010 Document Revised: 04/13/2014 Document Reviewed: 07/24/2013 [employer] Patient Information 2015 [employer]. This information is not intended to replace advice given to you by your health care provider. Make sure you discuss any questions you have with your health care provider.  

## 2015-01-30 NOTE — ED Provider Notes (Signed)
CSN: [id number]     Arrival date & time 01/30/15  1201 History   First MD Initiated Contact with Patient 01/30/15 1208     This chart was scribed for non-physician practitioner, [name], PA-C, working with Mirna Mires, MD by Forrestine Him, ED Scribe. This patient was seen in room WTR6/WTR6 and the patient's care was started at 1:27 PM.   Chief Complaint  Patient presents with  . Hip Pain  . Groin Pain   The history is provided by the patient. No language interpreter was used.    HPI Comments: Meghan Owen is a 59 y.o. female with a PMHx of spinal stenosis and thyroid disease who presents to the Emergency Department complaining of constant, moderate L groin pain that has been chronic but recently worsened yesterday. Pt states pain radiates across her L hip. She also mentions intermittent numbness to her L side. Pt states discomfort started after doing intense physical therapy a few hours prior to onset. Meghan Owen states she underwent surgery on her R foot several months ago and favored her L side during her healing process to bear weight. She has not tried any OTC medications or home remedies to help manage symptoms. Pt has a scheduled appointment with her Orthopedist at [facility] 2/25. Pt with known allergies to Penicillins, contrast media, and Prednisone.  She is followed by Fransisca Connors PA-C-Orhtopedics She is also followed by a Physical Therapist weekly  Past Medical History  Diagnosis Date  . Spinal stenosis   . Thyroid disease    Past Surgical History  Procedure Laterality Date  . Abdominal hysterectomy    . Foot surgery     Family History  Problem Relation Age of Onset  . Lupus Mother   . Cancer Other    History  Substance Use Topics  . Smoking status: Former Research scientist (life sciences)  . Smokeless tobacco: Not on file  . Alcohol Use: No   OB History    No data available     Review of Systems  Constitutional: Negative for fever and chills.  Musculoskeletal:  Positive for back pain and arthralgias.  Neurological: Positive for numbness. Negative for weakness.  All other systems reviewed and are negative.     Allergies  Contrast media; Penicillins; and Prednisone  Home Medications   Prior to Admission medications   Medication Sig Start Date End Date Taking? Authorizing Provider  Amino Acids (AMINO ACID PO) Take 1 tablet by mouth 2 (two) times daily.    Historical Provider, MD  Cholecalciferol (VITAMIN D-3) 1000 UNITS CAPS Take 5,000 Units by mouth daily.     Historical Provider, MD  Digestive Enzyme CAPS Take 1 capsule by mouth 3 (three) times daily with meals.    Historical Provider, MD  magnesium oxide (MAG-OX) 400 MG tablet Take 400 mg by mouth daily.    Historical Provider, MD  meloxicam (MOBIC) 15 MG tablet Take 1 tablet (15 mg total) by mouth daily. Take 1 daily with food. 01/30/15   [name], PA-C  Multiple Vitamin (MULTIVITAMIN WITH MINERALS) TABS Take 1 tablet by mouth daily. [facility]    Historical Provider, MD  naphazoline-pheniramine (ALLERGY EYE) 0.025-0.3 % ophthalmic solution Place 2 drops into both eyes daily as needed for allergies.    Historical Provider, MD  oxyCODONE-acetaminophen (PERCOCET/ROXICET) 5-325 MG per tablet Take 1-2 tablets by mouth every 6 (six) hours as needed for severe pain. Patient not taking: Reported on 01/07/2015 11/26/14   Threasa Beards, MD  Triage Vitals: BP 140/71 mmHg  Pulse 62  Temp(Src) 98.6 F (37 C) (Oral)  Resp 18  SpO2 100%   Physical Exam  Constitutional: She is oriented to person, place, and time. She appears well-developed and well-nourished.  HENT:  Head: Normocephalic.  Eyes: EOM are normal.  Neck: Normal range of motion.  Pulmonary/Chest: Effort normal.  Abdominal: She exhibits no distension.  Musculoskeletal: Normal range of motion.  A left hip exam was performed. SKIN: intact SWELLING: none WARMTH: no warmth TENDERNESS: none ROM: limited by pain, pain with  internal rotation and external rotation STRENGTH: normal GAIT: antalgic   Neurological: She is alert and oriented to person, place, and time.  Psychiatric: She has a normal mood and affect.  Nursing note and vitals reviewed.   ED Course  Procedures (including critical care time)  DIAGNOSTIC STUDIES: Oxygen Saturation is 100% on RA, Normal by my interpretation.    COORDINATION OF CARE: 1:34 PM- Will order DG hip unilat with pelvis 2-3 views L. Will discharge home with prescription for Mobic. Discussed treatment plan with pt at bedside and pt agreed to plan.   Labs Review Labs Reviewed - No data to display  Imaging Review Dg Hip Unilat With Pelvis 2-3 Views Left  01/30/2015   CLINICAL DATA:  Left hip pain.  No known injury.  EXAM: LEFT HIP (WITH PELVIS) 2-3 VIEWS  COMPARISON:  None.  FINDINGS: Small femoral head cyst. Small left lateral acetabular spur. Mild lower lumbar spine degenerative changes.  IMPRESSION: Minimal left hip degenerative changes.   Electronically Signed   By: Claudie Revering M.D.   On: 01/30/2015 13:15     EKG Interpretation None      MDM   Final diagnoses:  Left hip pain    Patient with chronic hip pain. sensations of catching and radiation to groin. XRay shows degenerative changes. No signs of infection or AVN> likley labral tear. Patient has f/u appoint with her ortho on 2/25. Will d/c with mobic disucussed return precautions.  I personally performed the services described in this documentation, which was scribed in my presence. The recorded information has been reviewed and is accurate.      [name], PA-C 02/01/15 [place], MD 02/05/15 [phone]

## 2015-01-30 NOTE — ED Notes (Addendum)
Pt requesting a copy of her xray for when she goes to [place],   Notified Radiology, will be bringing her a copy of the xray.

## 2015-01-30 NOTE — ED Notes (Signed)
Pt states that she has pain in her left groin that radiates across her left hip area that has been going on for little while but got worse yesterday. Pt states that she was going to therapy for it but got sick and didn't continue going.

## 2015-04-08 ENCOUNTER — Emergency Department (HOSPITAL_COMMUNITY)
Admission: EM | Admit: 2015-04-08 | Discharge: 2015-04-08 | Disposition: A | Discharge status: Home / Self Care | Payer: 59 | Attending: Emergency Medicine | Admitting: Emergency Medicine

## 2015-04-08 ENCOUNTER — Encounter (HOSPITAL_COMMUNITY): Payer: Self-pay

## 2015-04-08 ENCOUNTER — Emergency Department (HOSPITAL_COMMUNITY): Payer: 59

## 2015-04-08 DIAGNOSIS — Y9289 Other specified places as the place of occurrence of the external cause: Secondary | ICD-10-CM | POA: Diagnosis not present

## 2015-04-08 DIAGNOSIS — Z79899 Other long term (current) drug therapy: Secondary | ICD-10-CM | POA: Insufficient documentation

## 2015-04-08 DIAGNOSIS — W208XXA Other cause of strike by thrown, projected or falling object, initial encounter: Secondary | ICD-10-CM | POA: Insufficient documentation

## 2015-04-08 DIAGNOSIS — S99921A Unspecified injury of right foot, initial encounter: Secondary | ICD-10-CM

## 2015-04-08 DIAGNOSIS — Z791 Long term (current) use of non-steroidal anti-inflammatories (NSAID): Secondary | ICD-10-CM | POA: Diagnosis not present

## 2015-04-08 DIAGNOSIS — Z880 Allergy status to penicillin: Secondary | ICD-10-CM | POA: Insufficient documentation

## 2015-04-08 DIAGNOSIS — Z87891 Personal history of nicotine dependence: Secondary | ICD-10-CM | POA: Insufficient documentation

## 2015-04-08 DIAGNOSIS — Y9389 Activity, other specified: Secondary | ICD-10-CM | POA: Insufficient documentation

## 2015-04-08 DIAGNOSIS — Z8639 Personal history of other endocrine, nutritional and metabolic disease: Secondary | ICD-10-CM | POA: Insufficient documentation

## 2015-04-08 DIAGNOSIS — S97121A Crushing injury of right lesser toe(s), initial encounter: Secondary | ICD-10-CM | POA: Insufficient documentation

## 2015-04-08 DIAGNOSIS — Y998 Other external cause status: Secondary | ICD-10-CM | POA: Insufficient documentation

## 2015-04-08 DIAGNOSIS — Z8739 Personal history of other diseases of the musculoskeletal system and connective tissue: Secondary | ICD-10-CM | POA: Insufficient documentation

## 2015-04-08 MED ORDER — TRAMADOL HCL 50 MG PO TABS: 50.0000 mg | ORAL_TABLET | Freq: Four times a day (QID) | ORAL | Status: DC | PRN

## 2015-04-08 NOTE — ED Notes (Signed)
Patient states she was vacuuming and the handle of the vacuum fell on her right 5th toes. Patient isnow having slight swelling and pain.

## 2015-04-08 NOTE — Discharge Instructions (Signed)
Your xray did not show any breaks in the toe. It should get better in a few days with rest, ice and elevation.   Toe Injuries and Amputations You have cut off (amputated) part of your toe. Your outcome depends largely on how much was amputated. If just the tip is amputated, often the end of the toe will grow back and the toe may return much to the same as it was before the injury. If more of the toe is missing, your caregiver has done the best with the tissue remaining to allow you to keep as much toe as is possible or has finished the amputation at a level that will leave you with the most functional toe. This means a toe that will work the best for you. Please read the instructions outlined below and refer to this sheet in the next few weeks. These instructions provide you with general information on caring for yourself. Your caregiver may also give you specific instructions. While your treatment has been done according to the most current medical practices available, unavoidable complications occasionally occur. If you have any problems or questions after discharge, call your caregiver. HOME CARE INSTRUCTIONS   You may resume a normal diet and activities as directed or allowed.  Keep your foot elevated when possible. This helps decrease pain and swelling.  Keep ice packs (a bag of ice wrapped in a towel) on the injured area for 15-20 minutes, 03-04 times per day, for the first two days. Use ice only if OK with your caregiver.  Change dressings if necessary or as directed.  Clean the wounded area as directed.  Only take over-the-counter or prescription medicines for pain, discomfort, or fever as directed by your caregiver.  Keep appointments as directed. SEEK IMMEDIATE MEDICAL CARE IF:  There is redness, swelling, numbness or increasing pain in the wound.  There is pus coming from wound.  You have an unexplained oral temperature above 102 F (38.9 C) or as your caregiver  suggests.  There is a bad (foul) smell coming from the wound or dressing.  The edges of the wound break open (the edges are not staying together) after sutures or staples have been removed. Document Released: 10/18/2005 Document Revised: 02/19/2012 Document Reviewed: 03/17/2009 [facility] Patient Information 2015 [place], [place]. This information is not intended to replace advice given to you by your health care provider. Make sure you discuss any questions you have with your health care provider.

## 2015-04-08 NOTE — ED Provider Notes (Signed)
CSN: [id number]     Arrival date & time 04/08/15  1258 History  This chart was scribed for non-physician practitioner, [name], PA-C working with Carmin Muskrat, MD, by Chester Holstein, ED Scribe. This patient was seen in room WTR6/WTR6 and the patient's care was started at 1:48 PM.    Chief Complaint  Patient presents with  . Toe Pain    The history is provided by the patient. No language interpreter was used.   HPI Comments: BEVA REMUND is a 59 y.o. female with h/o of spinal stenosis, thyroid disease, and surgical repair of right foot. who presents to the Emergency Department complaining of right pinky toe pain with onset just PTA. Pt was vacuuming at onset and states handle of vacuum fell onto her foot. She notes mild associated swelling. Pt denies any other injury, numbness or weakness.   Past Medical History  Diagnosis Date  . Spinal stenosis   . Thyroid disease    Past Surgical History  Procedure Laterality Date  . Abdominal hysterectomy    . Foot surgery    . Ankle surgery     Family History  Problem Relation Age of Onset  . Lupus Mother   . Cancer Other    History  Substance Use Topics  . Smoking status: Former Research scientist (life sciences)  . Smokeless tobacco: Not on file  . Alcohol Use: No   OB History    No data available     Review of Systems  Musculoskeletal: Positive for myalgias, joint swelling and arthralgias.  Neurological: Negative for weakness and numbness.      Allergies  Contrast media; Penicillins; and Prednisone  Home Medications   Prior to Admission medications   Medication Sig Start Date End Date Taking? Authorizing Provider  Amino Acids (AMINO ACID PO) Take 1 tablet by mouth 2 (two) times daily.    Historical Provider, MD  Cholecalciferol (VITAMIN D-3) 1000 UNITS CAPS Take 5,000 Units by mouth daily.     Historical Provider, MD  Digestive Enzyme CAPS Take 1 capsule by mouth 3 (three) times daily with meals.    Historical Provider, MD  magnesium  oxide (MAG-OX) 400 MG tablet Take 400 mg by mouth daily.    Historical Provider, MD  meloxicam (MOBIC) 15 MG tablet Take 1 tablet (15 mg total) by mouth daily. Take 1 daily with food. 01/30/15   [name], PA-C  Multiple Vitamin (MULTIVITAMIN WITH MINERALS) TABS Take 1 tablet by mouth daily. [facility]    Historical Provider, MD  naphazoline-pheniramine (ALLERGY EYE) 0.025-0.3 % ophthalmic solution Place 2 drops into both eyes daily as needed for allergies.    Historical Provider, MD  oxyCODONE-acetaminophen (PERCOCET/ROXICET) 5-325 MG per tablet Take 1-2 tablets by mouth every 6 (six) hours as needed for severe pain. Patient not taking: Reported on 01/07/2015 11/26/14   Alfonzo Beers, MD   BP 117/60 mmHg  Pulse 71  Temp(Src) 98 F (36.7 C) (Oral)  Resp 16  SpO2 96% Physical Exam  Constitutional: She is oriented to person, place, and time. She appears well-developed and well-nourished.  HENT:  Head: Normocephalic.  Eyes: Conjunctivae are normal.  Neck: Normal range of motion. Neck supple.  Cardiovascular:  Distal pulses intact  Pulmonary/Chest: Effort normal.  Musculoskeletal: Normal range of motion.  Pain to right little toe  Neurological: She is alert and oriented to person, place, and time.  Skin: Skin is warm and dry.  Psychiatric: She has a normal mood and affect. Her behavior is normal.  Nursing note and vitals reviewed.   ED Course  Procedures (including critical care time) DIAGNOSTIC STUDIES: Oxygen Saturation is 96% on room air, normal by my interpretation.    COORDINATION OF CARE: 1:50 PM Discussed treatment plan with patient at beside including elevation and icing, the patient agrees with the plan and has no further questions at this time.   Labs Review Labs Reviewed - No data to display  Imaging Review No results found.   EKG Interpretation None     No orders of the defined types were placed in this encounter.    MDM   Filed Vitals:   04/08/15 1303   BP: 117/60  Pulse: 71  Temp: 98 F (36.7 C)  TempSrc: Oral  Resp: 16  SpO2: 96%    Final diagnoses:  Toe injury, right, initial encounter   Patient X-Ray negative for obvious fracture or dislocation. Pain managed in ED. Pt advised to follow up with orthopedics if symptoms persist for possibility of missed fracture diagnosis. Patient given brace while in ED, conservative therapy recommended and discussed. Patient will be dc home & is agreeable with above plan.  I personally performed the services described in this documentation, which was scribed in my presence. The recorded information has been reviewed and is accurate.        [name], PA-C 04/08/15 [place], MD 04/08/15 [phone]

## 2015-05-12 ENCOUNTER — Emergency Department (HOSPITAL_COMMUNITY)
Admission: EM | Admit: 2015-05-12 | Discharge: 2015-05-12 | Disposition: A | Discharge status: Home / Self Care | Payer: 59 | Attending: Emergency Medicine | Admitting: Emergency Medicine

## 2015-05-12 ENCOUNTER — Encounter (HOSPITAL_COMMUNITY): Payer: Self-pay | Admitting: Emergency Medicine

## 2015-05-12 DIAGNOSIS — Y998 Other external cause status: Secondary | ICD-10-CM | POA: Insufficient documentation

## 2015-05-12 DIAGNOSIS — Z87891 Personal history of nicotine dependence: Secondary | ICD-10-CM | POA: Diagnosis not present

## 2015-05-12 DIAGNOSIS — X58XXXA Exposure to other specified factors, initial encounter: Secondary | ICD-10-CM | POA: Diagnosis not present

## 2015-05-12 DIAGNOSIS — Z79899 Other long term (current) drug therapy: Secondary | ICD-10-CM | POA: Diagnosis not present

## 2015-05-12 DIAGNOSIS — Y9289 Other specified places as the place of occurrence of the external cause: Secondary | ICD-10-CM | POA: Diagnosis not present

## 2015-05-12 DIAGNOSIS — Z8739 Personal history of other diseases of the musculoskeletal system and connective tissue: Secondary | ICD-10-CM | POA: Diagnosis not present

## 2015-05-12 DIAGNOSIS — S79922A Unspecified injury of left thigh, initial encounter: Secondary | ICD-10-CM | POA: Diagnosis present

## 2015-05-12 DIAGNOSIS — S73102A Unspecified sprain of left hip, initial encounter: Secondary | ICD-10-CM

## 2015-05-12 DIAGNOSIS — Z8639 Personal history of other endocrine, nutritional and metabolic disease: Secondary | ICD-10-CM | POA: Diagnosis not present

## 2015-05-12 DIAGNOSIS — Y9389 Activity, other specified: Secondary | ICD-10-CM | POA: Diagnosis not present

## 2015-05-12 DIAGNOSIS — Z880 Allergy status to penicillin: Secondary | ICD-10-CM | POA: Diagnosis not present

## 2015-05-12 DIAGNOSIS — S76312A Strain of muscle, fascia and tendon of the posterior muscle group at thigh level, left thigh, initial encounter: Secondary | ICD-10-CM | POA: Insufficient documentation

## 2015-05-12 MED ORDER — MELOXICAM 15 MG PO TABS: 15.0000 mg | ORAL_TABLET | Freq: Every day | ORAL | Status: DC

## 2015-05-12 MED ORDER — CYCLOBENZAPRINE HCL 5 MG PO TABS: 5.0000 mg | ORAL_TABLET | Freq: Two times a day (BID) | ORAL | Status: DC | PRN

## 2015-05-12 NOTE — ED Provider Notes (Signed)
CSN: [id number]     Arrival date & time 05/12/15  1307 History  This chart was scribed for non-physician practitioner [name], PA-C working with Lajean Saver, MD by Hilda Lias, ED Scribe. This patient was seen in room WTR5/WTR5 and the patient's care was started at 2:42 PM.    Chief Complaint  Patient presents with  . Leg Pain      The history is provided by the patient. No language interpreter was used.     HPI Comments: Meghan Owen is a 59 y.o. female who presents to the Emergency Department complaining of constant left thigh pain that ranges from her anterior inner thigh to her outer thigh and down the outside of her leg to her knee that worsens with movement that has been present for a couple of weeks. Pt denies any known injury to the area, and notes that she has been seeing the chiropractor consistently to correct a difference in the lengths of her legs. Pt notes her right leg is longer than the left leg and states she has been doing stretches as well. Her chiropractor says she is compensating with her left leg. Pt denies back pain, abdominal pain, dysuria, weakness, numbness, wounds, hip pain, vaginal discharge or bleeding.  Past Medical History  Diagnosis Date  . Spinal stenosis   . Thyroid disease    Past Surgical History  Procedure Laterality Date  . Abdominal hysterectomy    . Foot surgery    . Ankle surgery     Family History  Problem Relation Age of Onset  . Lupus Mother   . Cancer Other    History  Substance Use Topics  . Smoking status: Former Research scientist (life sciences)  . Smokeless tobacco: Not on file  . Alcohol Use: No   OB History    No data available     Review of Systems  Gastrointestinal: Negative for abdominal pain.  Musculoskeletal: Positive for myalgias.      Allergies  Contrast media; Penicillins; and Prednisone  Home Medications   Prior to Admission medications   Medication Sig Start Date End Date Taking? Authorizing Provider  Cholecalciferol  (VITAMIN D3) 5000 UNITS CAPS Take 1 capsule by mouth daily.   Yes Historical Provider, MD  Cyanocobalamin (VITAMIN B 12 PO) Take 1 tablet by mouth daily.   Yes Historical Provider, MD  MAGNESIUM PO Take 1,300 mg by mouth daily.   Yes Historical Provider, MD  Multiple Vitamin (MULTIVITAMIN WITH MINERALS) TABS Take 1 tablet by mouth daily. [facility]   Yes Historical Provider, MD  OVER THE COUNTER MEDICATION Take 3 g by mouth 2 (two) times daily. L- Glutamine   Yes Historical Provider, MD  Probiotic Product (PROBIOTIC PO) Take 1 tablet by mouth daily.   Yes Historical Provider, MD  VITAMIN E PO Take 1 tablet by mouth daily.   Yes Historical Provider, MD  cyclobenzaprine (FLEXERIL) 5 MG tablet Take 1 tablet (5 mg total) by mouth 2 (two) times daily as needed for muscle spasms. 05/12/15   Uniqua Kihn Carlota Raspberry, PA-C  meloxicam (MOBIC) 15 MG tablet Take 1 tablet (15 mg total) by mouth daily. Take 1 daily with food. 05/12/15   Annel Zunker Carlota Raspberry, PA-C  traMADol (ULTRAM) 50 MG tablet Take 1 tablet (50 mg total) by mouth every 6 (six) hours as needed. Patient not taking: Reported on 05/12/2015 04/08/15   [name], PA-C   BP 130/61 mmHg  Pulse 64  Temp(Src) 98.7 F (37.1 C) (Oral)  Resp 18  SpO2  100% Physical Exam  Constitutional: She is oriented to person, place, and time. She appears well-developed and well-nourished.  HENT:  Head: Normocephalic and atraumatic.  Cardiovascular: Normal rate.   Pulmonary/Chest: Effort normal.  Abdominal: She exhibits no distension.  Musculoskeletal:       Left hip: She exhibits normal range of motion, normal strength, no tenderness, no bony tenderness, no swelling, no crepitus, no deformity and no laceration.       Left upper leg: She exhibits tenderness. She exhibits no bony tenderness, no swelling, no edema, no deformity and no laceration.  Pain illicit ed with resistance of adduction of the leg. Mild tenderness with ROM in all directions. No limp but pt does have spasms  with certain ROM.  No tenderness to low back or hip. No pain with ROM of left hip with or against resistance. No tenderness to abdominal wall. No pain to midline or paraspinal lumbar.  Neurological: She is alert and oriented to person, place, and time.  Skin: Skin is warm and dry.  Psychiatric: She has a normal mood and affect.  Nursing note and vitals reviewed.   ED Course  Procedures (including critical care time)  DIAGNOSTIC STUDIES: Oxygen Saturation is 100% on room air, normal by my interpretation.    COORDINATION OF CARE: 2:47 PM Discussed treatment plan with pt at bedside and pt agreed to plan.   Labs Review Labs Reviewed - No data to display  Imaging Review No results found.   EKG Interpretation None      MDM   Final diagnoses:  Thigh sprain, left, initial encounter    No systemic symptoms- or neurological deficits, no skin changes or acute injury. Pain MSK, relieved with rest and illicited by ROM and even worse against resistance. Refer back to [facility] doctors that she has.  Rx: NSAIDs and muscle relaxors.   Medications - No data to display  59 y.o.[name]'s evaluation in the Emergency Department is complete. It has been determined that no acute conditions requiring further emergency intervention are present at this time. The patient/guardian have been advised of the diagnosis and plan. We have discussed signs and symptoms that warrant return to the ED, such as changes or worsening in symptoms.  Vital signs are stable at discharge. Filed Vitals:   05/12/15 1456  BP: 130/61  Pulse: 64  Temp:   Resp: 18    Patient/guardian has voiced understanding and agreed to follow-up with the PCP or specialist.  I personally performed the services described in this documentation, which was scribed in my presence. The recorded information has been reviewed and is accurate.   [name], PA-C 05/12/15 [place],  PA-C 05/12/15 1502  Lajean Saver, MD 05/12/15 [phone]

## 2015-05-12 NOTE — Discharge Instructions (Signed)
Adductor Muscle Strain with Rehab  The adductor muscles of the thigh are responsible for moving the leg across the body and are susceptible to muscle strains. A strain is an injury to a muscle or a tendon that attaches the muscle to a bone. Strains of the adductor muscles occur where the muscle tendons attach to the pelvic bone. A muscle strain may be a complete or partial tear of the muscle and may involve one or more of the adductor muscles. These strains are usually classified as a grade 1 or 2 strain. A grade 1 strain has no obvious sign of tearing or stretching of the muscle or tendon, but may include significant inflammation. A grade 2 strain is a moderate strain in which the muscle or tendon has been partially torn and has been stretched. Grade 2 strains are usually accompanied with loss of strength. A grade 3 muscle strain rarely occurs in the adductor muscles. A grade 3 strain is a complete tear of the muscle or tendon. SYMPTOMS   Occasionally there is a sudden "pop" felt or heard in the groin or inner thigh at the time of injury.  There may be pain, tenderness, swelling, warmth, or redness over the inner thigh and groin. This may be worsened by moving the hip (especially when spreading the legs or hips, pushing the legs against each other or kicking with the affected leg). There may be bruising (contusion) in the groin and inner thigh within 48 hours following the injury.  There may be loss of fullness of the muscle with complete rupture (uncommon).  Muscle spasm in the groin and inner thigh can occur. CAUSES   Prolonged overuse or a sudden increase in intensity, frequency, or duration of activity.  Single episode of stressful overactivity, such as during kicking.  Single violent blow or force to the inner thigh (less common). RISK INCREASES WITH:  Sports that require repeated kicking (soccer, martial arts, football), as well as sports that require the legs to be brought together  (gymnastics, horseback riding).  Sports that require rapid acceleration (ice hockey, track and field).  Poor strength and flexibility.  Previous thigh injury. PREVENTION   Warm up and stretch properly before activity.  Maintain physical fitness:  Hip and thigh flexibility.  Muscle strength and endurance.  Cardiovascular fitness.  Complete the entire course of rehabilitation after any lower extremity injury. Do this before returning to competition or practice. Follow suggestions of your caregiver. PROGNOSIS  If treated properly, adductor muscle strains usually heal well within 2 to 6 weeks. RELATED COMPLICATIONS   Healing time will be prolonged if the condition is not appropriately treated. It needs adequate time to heal.  Do not return to activity too soon. Recurrence of symptoms and reinjury are possible.  If left untreated, the strain may progress to a complete tear (rare) or other injury caused by limping and favoring the injured leg.  Prolonged disability is possible. TREATMENT Treatment initially involves ice and medication to help reduce pain and inflammation. Strength and stretching exercises are recommended to maintain strength and a full range of motion. Strenuous activities should be modified to prevent further injury. Using crutches for the first few days may help to lessen pain. On rare occasions, surgery is necessary to reattach the tendon to the bone. If pain becomes persistent or chronic after more than 3 months of nonsurgical treatment, surgery may also be recommended. MEDICATION   If pain medication is necessary, nonsteroidal anti-inflammatory medications, such as aspirin and ibuprofen, or  other minor pain relievers, such as acetaminophen, are often recommended.  Do not take pain medication for 7 days before surgery or as advised.  Prescription pain relievers may be given. Use only as directed and only as much as you need.  Ointments applied to the skin may  be helpful.  Corticosteroid injections may be given to reduce inflammation. HEAT AND COLD  Cold treatment (icing) relieves pain and reduces inflammation. Cold treatment should be applied for 10 to 15 minutes every 2 to 3 hours for inflammation and pain and immediately after any activity that aggravates your symptoms. Use ice packs or an ice massage.  Heat treatment may be used prior to performing the stretching and strengthening activities prescribed by your caregiver, physical therapist, or athletic trainer. Use a heat pack or a warm soak. SEEK MEDICAL CARE IF:   Symptoms get worse or do not improve in 2 weeks, despite treatment.  New, unexplained symptoms develop. (Drugs used in treatment may produce side effects.) EXERCISES  RANGE OF MOTION (ROM) AND STRETCHING EXERCISES - Adductor Muscle Strain These exercises may help you when beginning to rehabilitate your injury. Your symptoms may resolve with or without further involvement from your physician, physical therapist, or athletic trainer. While completing these exercises, remember:   Restoring tissue flexibility helps normal motion to return to the joints. This allows healthier, less painful movement and activity.  An effective stretch should be held for at least 30 seconds.  A stretch should never be painful. You should only feel a gentle lengthening or release in the stretched tissue. STRETCH - Adductors, Lunge  While standing, spread your legs.  Lean away from your right / left leg by bending your opposite knee. You may rest your hands on your thigh for balance.  You should feel a stretch in your right / left inner thigh. Hold for __________ seconds. Repeat __________ times. Complete this exercise __________ times per day.  STRETCH - Adductors, Standing  Place your right / left foot on a counter or stable table. Turn away from your leg so both hips line up with your right / left leg.  Keeping your hips facing forward, slowly  bend your opposite leg until you feel a gentle stretch on the inside of your right / left thigh.  Hold for __________ seconds. Repeat __________ times. Complete this exercise __________ times per day.  STRETCH - Hip Adductors, Sitting   Sit on the floor and place the bottoms of your feet together. Keep your chest up and look straight ahead to keep your back in proper alignment. Slide your feet in towards your body as far as you can without rounding your back or increasing any discomfort.  Gently push down on your knees until you feel a gentle stretch in your inner thighs. Hold this position for __________ seconds. Repeat __________ times. Complete this exercise __________ times per day.  STRETCH - Hamstrings/Adductors, V-Sit  Sit on the floor with your legs extended in a large "V," keeping your knees straight.  With your head and chest upright, bend at your waist reaching for your left foot to stretch your right adductors.  You should feel a stretch in your right inner thigh. Hold for __________ seconds.  Return to the upright position to relax your leg muscles.  Continuing to keep your chest upright, bend straight forward at your waist to stretch your hamstrings.  You should feel a stretch behind both of your thighs and/or knees. Hold for __________ seconds.  Return to  the upright position to relax your leg muscles.  Repeat steps 2 through 4 for the right leg to stretch your left inner thigh. Repeat __________ times. Complete this exercise __________ times per day.  STRENGTHENING EXERCISES - Adductor Muscle Strain These exercises may help you when beginning to rehabilitate your injury. They may resolve your symptoms with or without further involvement from your physician, physical therapist, or athletic trainer. While completing these exercises, remember:   Muscles can gain both the endurance and the strength needed for everyday activities through controlled exercises.  Complete  these exercises as instructed by your physician, physical therapist, or athletic trainer. Progress the resistance and repetitions only as guided.  You may experience muscle soreness or fatigue, but the pain or discomfort you are trying to eliminate should never worsen during these exercises. If this pain does worsen, stop and make certain you are following the directions exactly. If the pain is still present after adjustments, discontinue the exercise until you can discuss the trouble with your caregiver. STRENGTH - Hip Adductors, Isometrics   Sit on a firm chair so that your knees are about the same height as your hips.  Place a large ball, firm pillow, or rolled up bath towel between your thighs.  Squeeze your thighs together, gradually building tension. Hold for __________ seconds.  Release the tension gradually and allow your inner thigh muscles to relax completely before repeating the exercise. Repeat __________ times. Complete this exercise __________ times per day.  STRENGTH - Hip Adductors, Straight Leg Raises   Lie on your side so that your head, shoulders, knee and hip line up. You may place your upper foot in front to help maintain your balance. Your right / left leg should be on the bottom.  Roll your hips slightly forward, so that your hips are stacked directly over each other and your right / left knee is facing forward.  Tense the muscles in your inner thigh and lift your bottom leg 4-6 inches. Hold this position for __________ seconds.  Slowly lower your leg to the starting position. Allow the muscles to fully relax before beginning the next repetition. Repeat __________ times. Complete this exercise __________ times per day.  Document Released: 11/27/2005 Document Revised: 02/19/2012 Document Reviewed: 03/11/2009 [facility] Patient Information 2015 [place], [place]. This information is not intended to replace advice given to you by your health care provider. Make sure you  discuss any questions you have with your health care provider.

## 2015-05-12 NOTE — ED Notes (Signed)
Per pt, states there is a catch in her left leg-no trauma-seeing chiropractor

## 2015-10-20 ENCOUNTER — Emergency Department (HOSPITAL_COMMUNITY)
Admission: EM | Admit: 2015-10-20 | Discharge: 2015-10-20 | Disposition: A | Discharge status: Home / Self Care | Payer: 59 | Attending: Emergency Medicine | Admitting: Emergency Medicine

## 2015-10-20 ENCOUNTER — Encounter (HOSPITAL_COMMUNITY): Payer: Self-pay | Admitting: Emergency Medicine

## 2015-10-20 ENCOUNTER — Emergency Department (HOSPITAL_COMMUNITY): Payer: 59

## 2015-10-20 DIAGNOSIS — Y9389 Activity, other specified: Secondary | ICD-10-CM | POA: Diagnosis not present

## 2015-10-20 DIAGNOSIS — Z87891 Personal history of nicotine dependence: Secondary | ICD-10-CM | POA: Insufficient documentation

## 2015-10-20 DIAGNOSIS — Z791 Long term (current) use of non-steroidal anti-inflammatories (NSAID): Secondary | ICD-10-CM | POA: Diagnosis not present

## 2015-10-20 DIAGNOSIS — Z8739 Personal history of other diseases of the musculoskeletal system and connective tissue: Secondary | ICD-10-CM | POA: Diagnosis not present

## 2015-10-20 DIAGNOSIS — Y9289 Other specified places as the place of occurrence of the external cause: Secondary | ICD-10-CM | POA: Diagnosis not present

## 2015-10-20 DIAGNOSIS — Z880 Allergy status to penicillin: Secondary | ICD-10-CM | POA: Diagnosis not present

## 2015-10-20 DIAGNOSIS — S4991XA Unspecified injury of right shoulder and upper arm, initial encounter: Secondary | ICD-10-CM

## 2015-10-20 DIAGNOSIS — Y998 Other external cause status: Secondary | ICD-10-CM | POA: Diagnosis not present

## 2015-10-20 DIAGNOSIS — Z8639 Personal history of other endocrine, nutritional and metabolic disease: Secondary | ICD-10-CM | POA: Diagnosis not present

## 2015-10-20 DIAGNOSIS — X58XXXA Exposure to other specified factors, initial encounter: Secondary | ICD-10-CM | POA: Diagnosis not present

## 2015-10-20 DIAGNOSIS — Z79899 Other long term (current) drug therapy: Secondary | ICD-10-CM | POA: Diagnosis not present

## 2015-10-20 MED ORDER — LORAZEPAM 2 MG/ML IJ SOLN: 1.0000 mg | Freq: Once | INTRAMUSCULAR | Status: DC

## 2015-10-20 MED ORDER — TRAMADOL HCL 50 MG PO TABS: 50.0000 mg | ORAL_TABLET | Freq: Four times a day (QID) | ORAL | Status: DC | PRN

## 2015-10-20 MED ORDER — NAPROXEN 375 MG PO TABS: 375.0000 mg | ORAL_TABLET | Freq: Two times a day (BID) | ORAL | Status: DC

## 2015-10-20 NOTE — ED Provider Notes (Signed)
CSN: [id number]     Arrival date & time 10/20/15  1414 History   By signing my name below, I, Evelene Croon, attest that this documentation has been prepared under the direction and in the presence of non-physician practitioner, [name], PA-C. Electronically Signed: Evelene Croon, Scribe. 10/20/2015. 3:17 PM.    Chief Complaint  Patient presents with  . Shoulder Pain    r/shoulder pain   The history is provided by the patient. No language interpreter was used.   HPI Comments:  Meghan Owen is a 59 y.o. female who presents to the Emergency Department complaining of moderate right shoulder pain for 2 days. She notes she was dragging leaves on a tarp with her right arm 3 days ago and yesterday she felt a pop in her shoulder. Pt is right hand dominant. No alleviating factors noted. Pt has no other complaints or symptoms at this time.    Past Medical History  Diagnosis Date  . Spinal stenosis   . Thyroid disease    Past Surgical History  Procedure Laterality Date  . Abdominal hysterectomy    . Foot surgery    . Ankle surgery     Family History  Problem Relation Age of Onset  . Lupus Mother   . Cancer Other    Social History  Substance Use Topics  . Smoking status: Former Research scientist (life sciences)  . Smokeless tobacco: None  . Alcohol Use: No   OB History    No data available     Review of Systems  Constitutional: Negative for fever and chills.  Respiratory: Negative for shortness of breath.   Cardiovascular: Negative for chest pain.  Musculoskeletal: Positive for myalgias and arthralgias.    Allergies  Contrast media; Penicillins; and Prednisone  Home Medications   Prior to Admission medications   Medication Sig Start Date End Date Taking? Authorizing Provider  Cholecalciferol (VITAMIN D3) 5000 UNITS CAPS Take 1 capsule by mouth daily.    Historical Provider, MD  Cyanocobalamin (VITAMIN B 12 PO) Take 1 tablet by mouth daily.    Historical Provider, MD  cyclobenzaprine  (FLEXERIL) 5 MG tablet Take 1 tablet (5 mg total) by mouth 2 (two) times daily as needed for muscle spasms. 05/12/15   [name], PA-C  MAGNESIUM PO Take 1,300 mg by mouth daily.    Historical Provider, MD  meloxicam (MOBIC) 15 MG tablet Take 1 tablet (15 mg total) by mouth daily. Take 1 daily with food. 05/12/15   [name], PA-C  Multiple Vitamin (MULTIVITAMIN WITH MINERALS) TABS Take 1 tablet by mouth daily. [facility]    Historical Provider, MD  OVER THE COUNTER MEDICATION Take 3 g by mouth 2 (two) times daily. L- Glutamine    Historical Provider, MD  Probiotic Product (PROBIOTIC PO) Take 1 tablet by mouth daily.    Historical Provider, MD  traMADol (ULTRAM) 50 MG tablet Take 1 tablet (50 mg total) by mouth every 6 (six) hours as needed. Patient not taking: Reported on 05/12/2015 04/08/15   [name], PA-C  VITAMIN E PO Take 1 tablet by mouth daily.    Historical Provider, MD   BP 137/77 mmHg  Pulse 63  Resp 18  SpO2 100% Physical Exam  Constitutional: She is oriented to person, place, and time. She appears well-developed and well-nourished. No distress.  HENT:  Head: Normocephalic and atraumatic.  Eyes: Conjunctivae are normal.  Cardiovascular: Normal rate.   Pulmonary/Chest: Effort normal.  Abdominal: She exhibits no distension.  Musculoskeletal:  Tenderness to right shoulder head of biceps tendon over lateral aspect of shoulder  Able to abduct to ~ 60 degrees and able to actively flex the shoulder to ~ 20-30 degress before limitation by pain Strength is limited in those directions NVI  Neurological: She is alert and oriented to person, place, and time.  Skin: Skin is warm and dry.  Psychiatric: She has a normal mood and affect.  Nursing note and vitals reviewed.    ED Course  Procedures   DIAGNOSTIC STUDIES:  Oxygen Saturation is 100% on RA, normal by my interpretation.    COORDINATION OF CARE:  3:15 PM Discussed treatment plan with pt at bedside and pt  agreed to plan.  Labs Review Labs Reviewed - No data to display  Imaging Review No results found. I have personally reviewed and evaluated these images as part of my medical decision-making.   EKG Interpretation None      MDM   Final diagnoses:  Shoulder injury, right, initial encounter   Patient X-Ray negative for obvious fracture or dislocation. Pain managed in ED. Pt advised to follow up with orthopedics if symptoms persist for possibility of missed fracture diagnosis. Patient given brace while in ED, conservative therapy recommended and discussed. Patient will be dc home & is agreeable with above plan.  .  I personally performed the services described in this documentation, which was scribed in my presence. The recorded information has been reviewed and is accurate.       [name], PA-C 10/20/15 1629  Harvel Quale, MD 10/21/15 1037

## 2015-10-20 NOTE — ED Notes (Signed)
Pt was pulling a large bag of leaves on Monday and felt a "popping "sensation in r/shoulder. Did not attempt OTC meds

## 2015-10-20 NOTE — Discharge Instructions (Signed)

## 2015-11-30 ENCOUNTER — Encounter (HOSPITAL_COMMUNITY): Payer: Self-pay | Admitting: *Deleted

## 2015-11-30 ENCOUNTER — Emergency Department (HOSPITAL_COMMUNITY)
Admission: EM | Admit: 2015-11-30 | Discharge: 2015-11-30 | Disposition: A | Discharge status: Home / Self Care | Payer: 59 | Attending: Emergency Medicine | Admitting: Emergency Medicine

## 2015-11-30 DIAGNOSIS — M545 Low back pain, unspecified: Secondary | ICD-10-CM

## 2015-11-30 DIAGNOSIS — Z791 Long term (current) use of non-steroidal anti-inflammatories (NSAID): Secondary | ICD-10-CM | POA: Diagnosis not present

## 2015-11-30 DIAGNOSIS — M7062 Trochanteric bursitis, left hip: Secondary | ICD-10-CM

## 2015-11-30 DIAGNOSIS — Z87891 Personal history of nicotine dependence: Secondary | ICD-10-CM | POA: Insufficient documentation

## 2015-11-30 DIAGNOSIS — Y9389 Activity, other specified: Secondary | ICD-10-CM | POA: Diagnosis not present

## 2015-11-30 DIAGNOSIS — Z8639 Personal history of other endocrine, nutritional and metabolic disease: Secondary | ICD-10-CM | POA: Insufficient documentation

## 2015-11-30 DIAGNOSIS — Z79899 Other long term (current) drug therapy: Secondary | ICD-10-CM | POA: Diagnosis not present

## 2015-11-30 DIAGNOSIS — Z880 Allergy status to penicillin: Secondary | ICD-10-CM | POA: Diagnosis not present

## 2015-11-30 MED ORDER — IBUPROFEN 800 MG PO TABS: 800.0000 mg | ORAL_TABLET | Freq: Three times a day (TID) | ORAL | Status: DC

## 2015-11-30 MED ORDER — IBUPROFEN 800 MG PO TABS
800.0000 mg | ORAL_TABLET | Freq: Once | ORAL | Status: AC
  Administered 2015-11-30: 800 mg via ORAL
  Filled 2015-11-30: qty 1

## 2015-11-30 NOTE — Discharge Instructions (Signed)
Call your orthopedic doctor if your symptoms do not improve. Use Ibuprofen or Tylenol for pain relief. Use ice or heat to your lower back, whichever provides the most pain relief.    Back Exercises The following exercises strengthen the muscles that help to support the back. They also help to keep the lower back flexible. Doing these exercises can help to prevent back pain or lessen existing pain. If you have back pain or discomfort, try doing these exercises 2-3 times each day or as told by your health care provider. When the pain goes away, do them once each day, but increase the number of times that you repeat the steps for each exercise (do more repetitions). If you do not have back pain or discomfort, do these exercises once each day or as told by your health care provider. EXERCISES Single Knee to Chest Repeat these steps 3-5 times for each leg:  Lie on your back on a firm bed or the floor with your legs extended.  Bring one knee to your chest. Your other leg should stay extended and in contact with the floor.  Hold your knee in place by grabbing your knee or thigh.  Pull on your knee until you feel a gentle stretch in your lower back.  Hold the stretch for 10-30 seconds.  Slowly release and straighten your leg. Pelvic Tilt Repeat these steps 5-10 times:  Lie on your back on a firm bed or the floor with your legs extended.  Bend your knees so they are pointing toward the ceiling and your feet are flat on the floor.  Tighten your lower abdominal muscles to press your lower back against the floor. This motion will tilt your pelvis so your tailbone points up toward the ceiling instead of pointing to your feet or the floor.  With gentle tension and even breathing, hold this position for 5-10 seconds. Cat-Cow Repeat these steps until your lower back becomes more flexible:  Get into a hands-and-knees position on a firm surface. Keep your hands under your shoulders, and keep your  knees under your hips. You may place padding under your knees for comfort.  Let your head hang down, and point your tailbone toward the floor so your lower back becomes rounded like the back of a cat.  Hold this position for 5 seconds.  Slowly lift your head and point your tailbone up toward the ceiling so your back forms a sagging arch like the back of a cow.  Hold this position for 5 seconds. Press-Ups Repeat these steps 5-10 times:  Lie on your abdomen (face-down) on the floor.  Place your palms near your head, about shoulder-width apart.  While you keep your back as relaxed as possible and keep your hips on the floor, slowly straighten your arms to raise the top half of your body and lift your shoulders. Do not use your back muscles to raise your upper torso. You may adjust the placement of your hands to make yourself more comfortable.  Hold this position for 5 seconds while you keep your back relaxed.  Slowly return to lying flat on the floor. Bridges Repeat these steps 10 times:  Lie on your back on a firm surface.  Bend your knees so they are pointing toward the ceiling and your feet are flat on the floor.  Tighten your buttocks muscles and lift your buttocks off of the floor until your waist is at almost the same height as your knees. You should feel the  muscles working in your buttocks and the back of your thighs. If you do not feel these muscles, slide your feet 1-2 inches farther away from your buttocks.  Hold this position for 3-5 seconds.  Slowly lower your hips to the starting position, and allow your buttocks muscles to relax completely. If this exercise is too easy, try doing it with your arms crossed over your chest. Abdominal Crunches Repeat these steps 5-10 times:  Lie on your back on a firm bed or the floor with your legs extended.  Bend your knees so they are pointing toward the ceiling and your feet are flat on the floor.  Cross your arms over your  chest.  Tip your chin slightly toward your chest without bending your neck.  Tighten your abdominal muscles and slowly raise your trunk (torso) high enough to lift your shoulder blades a tiny bit off of the floor. Avoid raising your torso higher than that, because it can put too much stress on your low back and it does not help to strengthen your abdominal muscles.  Slowly return to your starting position. Back Lifts Repeat these steps 5-10 times:  Lie on your abdomen (face-down) with your arms at your sides, and rest your forehead on the floor.  Tighten the muscles in your legs and your buttocks.  Slowly lift your chest off of the floor while you keep your hips pressed to the floor. Keep the back of your head in line with the curve in your back. Your eyes should be looking at the floor.  Hold this position for 3-5 seconds.  Slowly return to your starting position. SEEK MEDICAL CARE IF:  Your back pain or discomfort gets much worse when you do an exercise.  Your back pain or discomfort does not lessen within 2 hours after you exercise. If you have any of these problems, stop doing these exercises right away. Do not do them again unless your health care provider says that you can. SEEK IMMEDIATE MEDICAL CARE IF:  You develop sudden, severe back pain. If this happens, stop doing the exercises right away. Do not do them again unless your health care provider says that you can.   This information is not intended to replace advice given to you by your health care provider. Make sure you discuss any questions you have with your health care provider.   Document Released: 01/04/2005 Document Revised: 08/18/2015 Document Reviewed: 01/21/2015 Elsevier Interactive Patient Education 2016 [employer]  Back Pain, Adult Back pain is very common in adults.The cause of back pain is rarely dangerous and the pain often gets better over time.The cause of your back pain may not be known. Some  common causes of back pain include:  Strain of the muscles or ligaments supporting the spine.  Wear and tear (degeneration) of the spinal disks.  Arthritis.  Direct injury to the back. For many people, back pain may return. Since back pain is rarely dangerous, most people can learn to manage this condition on their own. HOME CARE INSTRUCTIONS Watch your back pain for any changes. The following actions may help to lessen any discomfort you are feeling:  Remain active. It is stressful on your back to sit or stand in one place for long periods of time. Do not sit, drive, or stand in one place for more than 30 minutes at a time. Take short walks on even surfaces as soon as you are able.Try to increase the length of time you walk each day.  Exercise regularly as directed by your health care provider. Exercise helps your back heal faster. It also helps avoid future injury by keeping your muscles strong and flexible.  Do not stay in bed.Resting more than 1-2 days can delay your recovery.  Pay attention to your body when you bend and lift. The most comfortable positions are those that put less stress on your recovering back. Always use proper lifting techniques, including:  Bending your knees.  Keeping the load close to your body.  Avoiding twisting.  Find a comfortable position to sleep. Use a firm mattress and lie on your side with your knees slightly bent. If you lie on your back, put a pillow under your knees.  Avoid feeling anxious or stressed.Stress increases muscle tension and can worsen back pain.It is important to recognize when you are anxious or stressed and learn ways to manage it, such as with exercise.  Take medicines only as directed by your health care provider. Over-the-counter medicines to reduce pain and inflammation are often the most helpful.Your health care provider may prescribe muscle relaxant drugs.These medicines help dull your pain so you can more quickly  return to your normal activities and healthy exercise.  Apply ice to the injured area:  Put ice in a plastic bag.  Place a towel between your skin and the bag.  Leave the ice on for 20 minutes, 2-3 times a day for the first 2-3 days. After that, ice and heat may be alternated to reduce pain and spasms.  Maintain a healthy weight. Excess weight puts extra stress on your back and makes it difficult to maintain good posture. SEEK MEDICAL CARE IF:  You have pain that is not relieved with rest or medicine.  You have increasing pain going down into the legs or buttocks.  You have pain that does not improve in one week.  You have night pain.  You lose weight.  You have a fever or chills. SEEK IMMEDIATE MEDICAL CARE IF:   You develop new bowel or bladder control problems.  You have unusual weakness or numbness in your arms or legs.  You develop nausea or vomiting.  You develop abdominal pain.  You feel faint.   This information is not intended to replace advice given to you by your health care provider. Make sure you discuss any questions you have with your health care provider.   Document Released: 11/27/2005 Document Revised: 12/18/2014 Document Reviewed: 03/31/2014 Elsevier Interactive Patient Education 2016 Else[employer] Inc.  Bursitis Bursitis is inflammation and irritation of a bursa, which is one of the small, fluid-filled sacs that cushion and protect the moving parts of your body. These sacs are located between bones and muscles, muscle attachments, or skin areas next to bones. A bursa protects these structures from the wear and tear that results from frequent movement. An inflamed bursa causes pain and swelling. Fluid may build up inside the sac. Bursitis is most common near joints, especially the knees, elbows, hips, and shoulders. CAUSES Bursitis can be caused by:   Injury from:  A direct blow, like falling on your knee or elbow.  Overuse of a joint (repetitive  stress).  Infection. This can happen if bacteria gets into a bursa through a cut or scrape near a joint.  Diseases that cause joint inflammation, such as gout and rheumatoid arthritis. RISK FACTORS You may be at risk for bursitis if you:   Have a job or hobby that involves a lot of repetitive stress on your joints.  Have a condition that weakens your body's defense system (immune system), such as diabetes, cancer, or HIV.  Lift and reach overhead often.  Kneel or lean on hard surfaces often.  Run or walk often. SIGNS AND SYMPTOMS The most common signs and symptoms of bursitis are:  Pain that gets worse when you move the affected body part or put weight on it.  Inflammation.  Stiffness. Other signs and symptoms may include:  Redness.  Tenderness.  Warmth.  Pain that continues after rest.  Fever and chills. This may occur in bursitis caused by infection. DIAGNOSIS Bursitis may be diagnosed by:   Medical history and physical exam.  MRI.  A procedure to drain fluid from the bursa with a needle (aspiration). The fluid may be checked for signs of infection or gout.  Blood tests to rule out other causes of inflammation. TREATMENT  Bursitis can usually be treated at home with rest, ice, compression, and elevation (RICE). For mild bursitis, RICE treatment may be all you need. Other treatments may include:  Nonsteroidal anti-inflammatory drugs (NSAIDs) to treat pain and inflammation.  Corticosteroids to fight inflammation. You may have these drugs injected into and around the area of bursitis.  Aspiration of bursitis fluid to relieve pain and improve movement.  Antibiotic medicine to treat an infected bursa.  A splint, brace, or walking aid.  Physical therapy if you continue to have pain or limited movement.  Surgery to remove a damaged or infected bursa. This may be needed if you have a very bad case of bursitis or if other treatments have not worked. HOME CARE  INSTRUCTIONS   Take medicines only as directed by your health care provider.  If you were prescribed an antibiotic medicine, finish it all even if you start to feel better.  Rest the affected area as directed by your health care provider.  Keep the area elevated.  Avoid activities that make pain worse.  Apply ice to the injured area:  Place ice in a plastic bag.  Place a towel between your skin and the bag.  Leave the ice on for 20 minutes, 2-3 times a day.  Use splints, braces, pads, or walking aids as directed by your health care provider.  Keep all follow-up visits as directed by your health care provider. This is important. PREVENTION   Wear knee pads if you kneel often.  Wear sturdy running or walking shoes that fit you well.  Take regular breaks from repetitive activity.  Warm up by stretching before doing any strenuous activity.  Maintain a healthy weight or lose weight as recommended by your health care provider. Ask your health care provider if you need help.  Exercise regularly. Start any new physical activity gradually. SEEK MEDICAL CARE IF:   Your bursitis is not responding to treatment or home care.  You have a fever.  You have chills.   This information is not intended to replace advice given to you by your health care provider. Make sure you discuss any questions you have with your health care provider.   Document Released: 11/24/2000 Document Revised: 08/18/2015 Document Reviewed: 02/16/2014 Elsevier Interactive Patient Education [employer].

## 2015-11-30 NOTE — ED Provider Notes (Signed)
CSN: [id number]     Arrival date & time 11/30/15  1154 History  By signing my name below, I, Meghan Owen, attest that this documentation has been prepared under the direction and in the presence of [name], PA-C Electronically Signed: Erling Owen, ED Scribe. 11/30/2015. 1:17 PM.    Chief Complaint  Patient presents with  . Back Pain  . Leg Pain    The history is provided by the patient. No language interpreter was used.    HPI Comments: Meghan Owen is a 59 y.o. female with a h/o spinal stenosis and trochanteric bursitis who presents to the Emergency Department complaining of gradual onset, intermittent left lower back pain that radiates down into her left hip and thigh that began 3 days ago. She describes it as an aching pain. She states that it feels similar to her trochanteric bursitis but covers a larger area of her thigh. Pt reports she was using a "snake tool" 3 days ago and was doing a twisting motion and standing for a long period of time. Pt reports the pain is exacerbated with movement, sitting and laying on her left side. Pain is relieved by laying on her right side. She denies any treatments tried prior to arrival. Pt has not seen her orthopedic specialist in several years but was previously seeing Dr. [name]. She is ambulatory without difficulty. She denies denies any bowel or bladder incontinence, numbness of the groin or legs, weakness or loss of sensation in the lower extremities. She has no other complaints today.    Past Medical History  Diagnosis Date  . Spinal stenosis   . Thyroid disease    Past Surgical History  Procedure Laterality Date  . Abdominal hysterectomy    . Foot surgery    . Ankle surgery     Family History  Problem Relation Age of Onset  . Lupus Mother   . Cancer Other    Social History  Substance Use Topics  . Smoking status: Former Research scientist (life sciences)  . Smokeless tobacco: None  . Alcohol Use: No   OB History    No data available      Review of Systems  Musculoskeletal: Positive for back pain.  All other systems reviewed and are negative.     Allergies  Contrast media; Penicillins; and Prednisone  Home Medications   Prior to Admission medications   Medication Sig Start Date End Date Taking? Authorizing Provider  Cholecalciferol (VITAMIN D3) 5000 UNITS CAPS Take 1 capsule by mouth daily.    Historical Provider, MD  Cyanocobalamin (VITAMIN B 12 PO) Take 1 tablet by mouth daily.    Historical Provider, MD  cyclobenzaprine (FLEXERIL) 5 MG tablet Take 1 tablet (5 mg total) by mouth 2 (two) times daily as needed for muscle spasms. 05/12/15   [name], PA-C  ibuprofen (ADVIL,MOTRIN) 800 MG tablet Take 1 tablet (800 mg total) by mouth 3 (three) times daily. 11/30/15   [name], PA-C  MAGNESIUM PO Take 1,300 mg by mouth daily.    Historical Provider, MD  meloxicam (MOBIC) 15 MG tablet Take 1 tablet (15 mg total) by mouth daily. Take 1 daily with food. 05/12/15   [name], PA-C  Multiple Vitamin (MULTIVITAMIN WITH MINERALS) TABS Take 1 tablet by mouth daily. [facility]    Historical Provider, MD  naproxen (NAPROSYN) 375 MG tablet Take 1 tablet (375 mg total) by mouth 2 (two) times daily. 10/20/15   [name], PA-C  OVER THE COUNTER MEDICATION Take 3  g by mouth 2 (two) times daily. L- Glutamine    Historical Provider, MD  Probiotic Product (PROBIOTIC PO) Take 1 tablet by mouth daily.    Historical Provider, MD  traMADol (ULTRAM) 50 MG tablet Take 1 tablet (50 mg total) by mouth every 6 (six) hours as needed. 10/20/15   [name], PA-C  VITAMIN E PO Take 1 tablet by mouth daily.    Historical Provider, MD   Triage Vitals: BP 143/79 mmHg  Pulse 61  Temp(Src) 98.4 F (36.9 C) (Oral)  Resp 19  SpO2 99%  Physical Exam  Constitutional: She appears well-developed and well-nourished. No distress.  HENT:  Head: Normocephalic and atraumatic.  Eyes: Conjunctivae are normal. Right eye exhibits no  discharge. Left eye exhibits no discharge. No scleral icterus.  Neck: Normal range of motion.  Cardiovascular: Normal rate, regular rhythm and intact distal pulses.   Pedal pulse palpable.  Pulmonary/Chest: Effort normal. No respiratory distress.  Musculoskeletal: Normal range of motion.       Left hip: She exhibits tenderness. She exhibits normal range of motion, normal strength and no deformity.       Lumbar back: She exhibits tenderness. She exhibits normal range of motion, no bony tenderness, no deformity and no spasm.       Legs: Lumbar spine and left hip retains full range of motion. Generalized tenderness over the left lumbar region extending over the left buttock into the left lateral hip. No muscle spasm felt. No bony tenderness of the lumbar spine. No bony deformities or step-offs. Negative straight leg raise test bilaterally. No edema or deformity. She is able to ambulate with a steady gait unassisted.  Neurological: She is alert. Coordination normal.  5 out of 5 strength of the bilateral lower extremities. Sensation to light touch intact throughout.  Skin: Skin is warm and dry. No rash noted.  No rash or other skin changes over the lumbar region or left thigh.  Psychiatric: She has a normal mood and affect. Her behavior is normal.  Nursing note and vitals reviewed.   ED Course  Procedures (including critical care time)  DIAGNOSTIC STUDIES: Oxygen Saturation is 99% on RA, normal by my interpretation.    COORDINATION OF CARE: 12:46 PM- Recommended Tylenol/Ibuprofen along with alternating heat & ice and rest. Also advised pt that is the pain continues to call her orthopedic specialist for a f/u appt.  Pt advised of plan for treatment and pt agrees.  Labs Review Labs Reviewed - No data to display  Imaging Review No results found.    EKG Interpretation None      MDM   Final diagnoses:  Left-sided low back pain without sciatica  Trochanteric bursitis of left hip    Patient presenting with back pain x 3 days. She reports doing housework over the weekend which aggravated her low back pain and left hip pain. She has tried no treatments prior to arrival. Afebrile. Non-focal neuro exam. Generalized tenderness over the left lumbar back radiating into the left buttock and left thigh. Full range of motion of the lumbar spine and left hip. Patient is able to ambulate though with some discomfort. No loss of bowel or bladder control. No numbness or weakness in the lower extremities. No concern for cauda equina. No history of IVDU or cancer. Conservative therapy including back exercises, heat, ice, tylenol or ibuprofen discussed. Patient is to follow-up with her orthopedic doctor if symptoms do not improve. Return precautions discussed and given in discharge paperwork. Pt  is stable for discharge.    I personally performed the services described in this documentation, which was scribed in my presence. The recorded information has been reviewed and is accurate.     [name], PA-C 11/30/15 [place], DO 11/30/15 [phone]

## 2015-11-30 NOTE — ED Notes (Signed)
Pt reports hx of left greater trochanter syndrome. 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 Since then pt has had left lower back pain/ left hip pain that radiates down thigh. Pain 8/10.

## 2016-01-04 DIAGNOSIS — M1612 Unilateral primary osteoarthritis, left hip: Secondary | ICD-10-CM | POA: Insufficient documentation

## 2016-02-28 ENCOUNTER — Encounter (HOSPITAL_COMMUNITY): Payer: Self-pay | Admitting: Emergency Medicine

## 2016-02-28 ENCOUNTER — Emergency Department (HOSPITAL_COMMUNITY)
Admission: EM | Admit: 2016-02-28 | Discharge: 2016-02-28 | Disposition: A | Discharge status: Home / Self Care | Payer: 59 | Attending: Emergency Medicine | Admitting: Emergency Medicine

## 2016-02-28 DIAGNOSIS — Z79899 Other long term (current) drug therapy: Secondary | ICD-10-CM | POA: Diagnosis not present

## 2016-02-28 DIAGNOSIS — Z87891 Personal history of nicotine dependence: Secondary | ICD-10-CM | POA: Insufficient documentation

## 2016-02-28 DIAGNOSIS — Z880 Allergy status to penicillin: Secondary | ICD-10-CM | POA: Diagnosis not present

## 2016-02-28 DIAGNOSIS — Z791 Long term (current) use of non-steroidal anti-inflammatories (NSAID): Secondary | ICD-10-CM | POA: Insufficient documentation

## 2016-02-28 DIAGNOSIS — M546 Pain in thoracic spine: Secondary | ICD-10-CM | POA: Diagnosis present

## 2016-02-28 DIAGNOSIS — M545 Low back pain: Secondary | ICD-10-CM | POA: Insufficient documentation

## 2016-02-28 DIAGNOSIS — M549 Dorsalgia, unspecified: Secondary | ICD-10-CM

## 2016-02-28 DIAGNOSIS — Z8639 Personal history of other endocrine, nutritional and metabolic disease: Secondary | ICD-10-CM | POA: Diagnosis not present

## 2016-02-28 MED ORDER — IBUPROFEN 600 MG PO TABS: 600.0000 mg | ORAL_TABLET | Freq: Four times a day (QID) | ORAL | Status: DC | PRN

## 2016-02-28 MED ORDER — METHOCARBAMOL 500 MG PO TABS: 500.0000 mg | ORAL_TABLET | Freq: Two times a day (BID) | ORAL | Status: DC

## 2016-02-28 NOTE — ED Provider Notes (Signed)
CSN: [id number]     Arrival date & time 02/28/16  1126 History  By signing my name below, I, Rayna Sexton, attest that this documentation has been prepared under the direction and in the presence of Elizabeth C. Westfall, PA-C. Electronically Signed: Rayna Sexton, ED Scribe. 02/28/2016. 12:27 PM.    Chief Complaint  Patient presents with  . Back Pain    The history is provided by the patient. No language interpreter was used.     HPI Comments: Meghan Owen is a 60 y.o. female with a PMHx including spinal stenosis and thyroid disease who presents to the Emergency Department complaining of moderate back pain that began yesterday and worsened beginning last night. Pt notes mild back pain to the affected region beginning yesterday while playing with her grandson that worsened suddenly after bending over. Her pain worsens with movement or when sitting. She notes taking a Curcumin supplement which provided a mild relief of her pain. Her pain radiates through her gluteal region down her bilateral legs. Pt denies a PMHx including CA or a hx of being on blood thinning medications. She confirms her listed PCP. She denies incontinence of her bowels or bladder, numbness, tingling or any other associated symptoms at this time.    Past Medical History  Diagnosis Date  . Spinal stenosis   . Thyroid disease    Past Surgical History  Procedure Laterality Date  . Abdominal hysterectomy    . Foot surgery    . Ankle surgery     Family History  Problem Relation Age of Onset  . Lupus Mother   . Cancer Other    Social History  Substance Use Topics  . Smoking status: Former Research scientist (life sciences)  . Smokeless tobacco: None  . Alcohol Use: No   OB History    No data available      Review of Systems  Gastrointestinal:       Negative incontinence of bowels  Genitourinary:       Negative incontinence of bladder  Musculoskeletal: Positive for myalgias and back pain.  Neurological: Negative for numbness.     Allergies  Contrast media; Penicillins; and Prednisone  Home Medications   Prior to Admission medications   Medication Sig Start Date End Date Taking? Authorizing Provider  Cholecalciferol (VITAMIN D3) 5000 UNITS CAPS Take 1 capsule by mouth daily.    Historical Provider, MD  Cyanocobalamin (VITAMIN B 12 PO) Take 1 tablet by mouth daily.    Historical Provider, MD  cyclobenzaprine (FLEXERIL) 5 MG tablet Take 1 tablet (5 mg total) by mouth 2 (two) times daily as needed for muscle spasms. 05/12/15   [name], PA-C  ibuprofen (ADVIL,MOTRIN) 600 MG tablet Take 1 tablet (600 mg total) by mouth every 6 (six) hours as needed. 02/28/16   Marella Chimes, PA-C  MAGNESIUM PO Take 1,300 mg by mouth daily.    Historical Provider, MD  meloxicam (MOBIC) 15 MG tablet Take 1 tablet (15 mg total) by mouth daily. Take 1 daily with food. 05/12/15   [name], PA-C  methocarbamol (ROBAXIN) 500 MG tablet Take 1 tablet (500 mg total) by mouth 2 (two) times daily. 02/28/16   Marella Chimes, PA-C  Multiple Vitamin (MULTIVITAMIN WITH MINERALS) TABS Take 1 tablet by mouth daily. [facility]    Historical Provider, MD  naproxen (NAPROSYN) 375 MG tablet Take 1 tablet (375 mg total) by mouth 2 (two) times daily. 10/20/15   [name], PA-C  OVER THE COUNTER MEDICATION Take  3 g by mouth 2 (two) times daily. L- Glutamine    Historical Provider, MD  Probiotic Product (PROBIOTIC PO) Take 1 tablet by mouth daily.    Historical Provider, MD  traMADol (ULTRAM) 50 MG tablet Take 1 tablet (50 mg total) by mouth every 6 (six) hours as needed. 10/20/15   [name], PA-C  VITAMIN E PO Take 1 tablet by mouth daily.    Historical Provider, MD    BP 148/82 mmHg  Pulse 68  Temp(Src) 98.6 F (37 C) (Oral)  Resp 16  SpO2 100%  Physical Exam  Constitutional: She is oriented to person, place, and time. She appears well-developed and well-nourished. No distress.  HENT:  Head: Normocephalic and atraumatic.   Right Ear: External ear normal.  Left Ear: External ear normal.  Nose: Nose normal.  Eyes: Conjunctivae and EOM are normal. Right eye exhibits no discharge. Left eye exhibits no discharge. No scleral icterus.  Neck: Normal range of motion. Neck supple.  Cardiovascular: Normal rate, regular rhythm and intact distal pulses.   Pulmonary/Chest: Effort normal and breath sounds normal. No respiratory distress.  Abdominal: Soft. She exhibits no distension and no mass. There is no tenderness. There is no rebound and no guarding.  Musculoskeletal: Normal range of motion. She exhibits tenderness. She exhibits no edema.  Mild TTP over lumbar paraspinal muscles bilaterally. No midline tenderness, step-off, or deformity.  Neurological: She is alert and oriented to person, place, and time. She has normal strength and normal reflexes. No sensory deficit.  Skin: Skin is warm and dry. She is not diaphoretic.  Psychiatric: She has a normal mood and affect. Her behavior is normal.  Nursing note and vitals reviewed.   ED Course  Procedures   DIAGNOSTIC STUDIES: Oxygen Saturation is 100% on RA, normal by my interpretation.    COORDINATION OF CARE: 12:19 PM Discussed next steps with pt. She verbalized understanding and is agreeable with the plan.   Labs Review Labs Reviewed - No data to display  Imaging Review No results found.    EKG Interpretation None      MDM   Final diagnoses:  Back pain, unspecified location    60 year old female presents with low back pain, which started last night after bending over to pick up her grandson and worsened after bending over to help him put his pajamas on. Denies numbness, weakness, paresthesia, bowel or bladder incontinence, saddle anesthesia, history of malignancy, IVDU, anticoagulant use. Patient is afebrile. Vital signs stable. On exam, patient has mild TTP to her lumbar paraspinal muscles bilaterally. No midline tenderness, step-off, or deformity.  Strength, sensation, DTRs intact. Patient is ambulatory. Low suspicion for cauda equina, hematoma, abscess. Symptoms likely muscular. Will treat with anti-inflammatory and muscle relaxant. Patient to follow-up with PCP. Return precautions discussed. Patient verbalizes her understanding and is in agreement with plan.  BP 148/82 mmHg  Pulse 68  Temp(Src) 98.6 F (37 C) (Oral)  Resp 16  SpO2 100%  I personally performed the services described in this documentation, which was scribed in my presence. The recorded information has been reviewed and is accurate.    Marella Chimes, PA-C 02/28/16 [place], MD 02/28/16 [phone]

## 2016-02-28 NOTE — ED Notes (Signed)
Pt c/o sudden onset back pain after bending down to help grandson put on his pajamas. Pt had strained it earlier in the day yesterday after trying to catch him from sliding off of her lap. Pt A&Ox4 and ambulatory. Denies numbness and tingling but c/o shooting pain down both legs.

## 2016-02-28 NOTE — Discharge Instructions (Signed)
1. Medications: ibuprofen, robaxin, usual home medications 2. Treatment: rest, drink plenty of fluids, heat 3. Follow Up: please followup with your primary doctor in 3-5 days for discussion of your diagnoses and further evaluation after today's visit; please return to the ER for increased pain, numbness, weakness, loss of control of your bowel or bladder, new or worsening symptoms   Back Pain, Adult Back pain is very common. The pain often gets better over time. The cause of back pain is usually not dangerous. Most people can learn to manage their back pain on their own.  HOME CARE  Watch your back pain for any changes. The following actions may help to lessen any pain you are feeling:  Stay active. Start with short walks on flat ground if you can. Try to walk farther each day.  Exercise regularly as told by your doctor. Exercise helps your back heal faster. It also helps avoid future injury by keeping your muscles strong and flexible.  Do not sit, drive, or stand in one place for more than 30 minutes.  Do not stay in bed. Resting more than 1-2 days can slow down your recovery.  Be careful when you bend or lift an object. Use good form when lifting:  Bend at your knees.  Keep the object close to your body.  Do not twist.  Sleep on a firm mattress. Lie on your side, and bend your knees. If you lie on your back, put a pillow under your knees.  Take medicines only as told by your doctor.  Put ice on the injured area.  Put ice in a plastic bag.  Place a towel between your skin and the bag.  Leave the ice on for 20 minutes, 2-3 times a day for the first 2-3 days. After that, you can switch between ice and heat packs.  Avoid feeling anxious or stressed. Find good ways to deal with stress, such as exercise.  Maintain a healthy weight. Extra weight puts stress on your back. GET HELP IF:   You have pain that does not go away with rest or medicine.  You have worsening pain that  goes down into your legs or buttocks.  You have pain that does not get better in one week.  You have pain at night.  You lose weight.  You have a fever or chills. GET HELP RIGHT AWAY IF:   You cannot control when you poop (bowel movement) or pee (urinate).  Your arms or legs feel weak.  Your arms or legs lose feeling (numbness).  You feel sick to your stomach (nauseous) or throw up (vomit).  You have belly (abdominal) pain.  You feel like you may pass out (faint).   This information is not intended to replace advice given to you by your health care provider. Make sure you discuss any questions you have with your health care provider.   Document Released: 05/15/2008 Document Revised: 12/18/2014 Document Reviewed: 03/31/2014 Elsevier Interactive Patient Education [employer].

## 2016-05-22 ENCOUNTER — Other Ambulatory Visit: Payer: Self-pay | Admitting: Physician Assistant

## 2016-05-22 DIAGNOSIS — R110 Nausea: Secondary | ICD-10-CM

## 2016-05-22 DIAGNOSIS — R1011 Right upper quadrant pain: Secondary | ICD-10-CM

## 2016-05-25 ENCOUNTER — Ambulatory Visit
Admission: RE | Admit: 2016-05-25 | Discharge: 2016-05-25 | Disposition: A | Discharge status: Home / Self Care | Payer: 59 | Source: Ambulatory Visit | Attending: Physician Assistant | Admitting: Physician Assistant

## 2016-05-25 DIAGNOSIS — R1011 Right upper quadrant pain: Secondary | ICD-10-CM

## 2016-05-25 DIAGNOSIS — R110 Nausea: Secondary | ICD-10-CM

## 2016-05-31 ENCOUNTER — Other Ambulatory Visit: Payer: Self-pay | Admitting: Physician Assistant

## 2016-05-31 DIAGNOSIS — R160 Hepatomegaly, not elsewhere classified: Secondary | ICD-10-CM

## 2016-06-10 ENCOUNTER — Ambulatory Visit
Admission: RE | Admit: 2016-06-10 | Discharge: 2016-06-10 | Disposition: A | Discharge status: Home / Self Care | Payer: 59 | Source: Ambulatory Visit | Attending: Physician Assistant | Admitting: Physician Assistant

## 2016-06-10 DIAGNOSIS — R160 Hepatomegaly, not elsewhere classified: Secondary | ICD-10-CM

## 2016-06-10 MED ORDER — GADOBENATE DIMEGLUMINE 529 MG/ML IV SOLN
20.0000 mL | Freq: Once | INTRAVENOUS | Status: AC | PRN
  Administered 2016-06-10: 20 mL via INTRAVENOUS

## 2016-06-14 ENCOUNTER — Other Ambulatory Visit: Payer: Self-pay | Admitting: Neurosurgery

## 2016-06-14 DIAGNOSIS — M4802 Spinal stenosis, cervical region: Secondary | ICD-10-CM

## 2016-06-19 ENCOUNTER — Encounter (HOSPITAL_COMMUNITY): Payer: Self-pay | Admitting: Emergency Medicine

## 2016-06-19 ENCOUNTER — Emergency Department (HOSPITAL_COMMUNITY)
Admission: EM | Admit: 2016-06-19 | Discharge: 2016-06-19 | Disposition: A | Discharge status: Home / Self Care | Payer: 59 | Attending: Emergency Medicine | Admitting: Emergency Medicine

## 2016-06-19 DIAGNOSIS — G8929 Other chronic pain: Secondary | ICD-10-CM | POA: Diagnosis not present

## 2016-06-19 DIAGNOSIS — Z87891 Personal history of nicotine dependence: Secondary | ICD-10-CM | POA: Insufficient documentation

## 2016-06-19 DIAGNOSIS — M25552 Pain in left hip: Secondary | ICD-10-CM | POA: Diagnosis not present

## 2016-06-19 DIAGNOSIS — M1990 Unspecified osteoarthritis, unspecified site: Secondary | ICD-10-CM | POA: Insufficient documentation

## 2016-06-19 HISTORY — DX: Unspecified osteoarthritis, unspecified site: M19.90

## 2016-06-19 MED ORDER — IBUPROFEN 800 MG PO TABS: 800.0000 mg | ORAL_TABLET | Freq: Three times a day (TID) | ORAL | Status: DC

## 2016-06-19 NOTE — ED Provider Notes (Signed)
CSN: [id number]     Arrival date & time 06/19/16  1217 History  By signing my name below, I, Eustaquio Maize, attest that this documentation has been prepared under the direction and in the presence of Juelle Dickmann, PA-C. Electronically Signed: Eustaquio Maize, ED Scribe. 06/19/2016. 2:42 PM.   Chief Complaint  Patient presents with  . Hip Pain   The history is provided by the patient. No language interpreter was used.   HPI Comments: Meghan Owen is a 60 y.o. female with PMHx arthritis who presents to the Emergency Department complaining of gradual onset, constant, left hip pain radiating to left thigh and left groin x 3 days. Pt reports that she was mowing the yard and stepped into a hole on the lawn which she believes "jarred" her hip. Pt did not fall at that time. Shortly after mowing the lawn, she began having pain to the hip. Pt then did a couple of hip stretches yesterday causing worsening pain to the hip. The pain is aggravated by movement and ambulating. She reports a history of chronic left hip pain related to bursitis/arthritis and back pain related to spinal stenosis. She denies worsening or change in her back pain today. She has not taken any medications for the pain because "I don't like taking medicines". Pt is unable to bear full weight onto the hip due to the pain but can ambulate with a limp. She mentions using a crutch at home when her hip pain is aggravated. Pt has followed with orthopedist, Dr. [name], in February 2017 and had PT done after that. Denies weakness, numbness, tingling, back pain, or any other associated symptoms.   Past Medical History  Diagnosis Date  . Spinal stenosis   . Thyroid disease   . Arthritis    Past Surgical History  Procedure Laterality Date  . Abdominal hysterectomy    . Foot surgery    . Ankle surgery     Family History  Problem Relation Age of Onset  . Lupus Mother   . Cancer Other    Social History  Substance Use Topics  . Smoking  status: Former Research scientist (life sciences)  . Smokeless tobacco: None  . Alcohol Use: No   OB History    No data available     Review of Systems  Musculoskeletal: Positive for arthralgias. Negative for back pain.  Neurological: Negative for weakness and numbness.  All other systems reviewed and are negative.  Allergies  Contrast media; Penicillins; and Prednisone  Home Medications   Prior to Admission medications   Medication Sig Start Date End Date Taking? Authorizing Provider  Cholecalciferol (VITAMIN D3) 5000 UNITS CAPS Take 1 capsule by mouth daily.    Historical Provider, MD  Cyanocobalamin (VITAMIN B 12 PO) Take 1 tablet by mouth daily.    Historical Provider, MD  cyclobenzaprine (FLEXERIL) 5 MG tablet Take 1 tablet (5 mg total) by mouth 2 (two) times daily as needed for muscle spasms. 05/12/15   [name], PA-C  ibuprofen (ADVIL,MOTRIN) 600 MG tablet Take 1 tablet (600 mg total) by mouth every 6 (six) hours as needed. 02/28/16   Marella Chimes, PA-C  ibuprofen (ADVIL,MOTRIN) 800 MG tablet Take 1 tablet (800 mg total) by mouth 3 (three) times daily. 06/19/16   Talena Neira, PA-C  MAGNESIUM PO Take 1,300 mg by mouth daily.    Historical Provider, MD  meloxicam (MOBIC) 15 MG tablet Take 1 tablet (15 mg total) by mouth daily. Take 1 daily with food. 05/12/15  [name], PA-C  methocarbamol (ROBAXIN) 500 MG tablet Take 1 tablet (500 mg total) by mouth 2 (two) times daily. 02/28/16   Marella Chimes, PA-C  Multiple Vitamin (MULTIVITAMIN WITH MINERALS) TABS Take 1 tablet by mouth daily. [facility] O[facility]    Historical Provider, MD  naproxen (NAPROSYN) 375 MG tablet Take 1 tablet (375 mg total) by mouth 2 (two) times daily. 10/20/15   [name], PA-C  OVER THE COUNTER MEDICATION Take 3 g by mouth 2 (two) times daily. L- Glutamine    Historical Provider, MD  Probiotic Product (PROBIOTIC PO) Take 1 tablet by mouth daily.    Historical Provider, MD  traMADol (ULTRAM) 50 MG tablet Take 1 tablet  (50 mg total) by mouth every 6 (six) hours as needed. 10/20/15   [name], PA-C  VITAMIN E PO Take 1 tablet by mouth daily.    Historical Provider, MD   BP 113/59 mmHg  Pulse 58  Temp(Src) 98.3 F (36.8 C) (Oral)  Resp 18  Wt 106.142 kg  SpO2 100%   Physical Exam  Constitutional: She appears well-developed and well-nourished. No distress.  HENT:  Head: Normocephalic and atraumatic.  Eyes: Conjunctivae are normal. Right eye exhibits no discharge. Left eye exhibits no discharge. No scleral icterus.  Neck: Normal range of motion.  Cardiovascular: Normal rate, regular rhythm and intact distal pulses.   Pulmonary/Chest: Effort normal. No respiratory distress.  Musculoskeletal: Normal range of motion.       Left hip: She exhibits tenderness. She exhibits normal range of motion, normal strength and no deformity.       Legs: Generalized TTP at anterior and lateral hip. Tenderness extends into anterior thigh. FROM of the hip, knee and ankle intact. No deformity of the left hip or knee. No swelling, erythema or warmth of the hip or knee. No TTP of the lumbar back. Negative SLR. Pt ambulates with a steady gait with a limp.   Neurological: She is alert. Coordination normal.  5/5 strength of the BLE. Sensation to light touch intact throughout.   Skin: Skin is warm and dry.  Psychiatric: She has a normal mood and affect. Her behavior is normal.  Nursing note and vitals reviewed.   ED Course  Procedures (including critical care time)  DIAGNOSTIC STUDIES: Oxygen Saturation is 100% on RA, normal by my interpretation.    COORDINATION OF CARE: 2:41 PM-Discussed treatment plan which includes Rx muscle relaxants with pt at bedside and pt agreed to plan.   Labs Review Labs Reviewed - No data to display  Imaging Review No results found. I have personally reviewed and evaluated these images and lab results as part of my medical decision-making.   EKG Interpretation None      MDM    Final diagnoses:  Left hip pain   60 year old female with hx of chronic intermittent left hip pain presenting with hip pain after mowing the yard and stretching x 3 days. Afebrile and nontoxic appearing. Left lower extremity is neurovascularly intact with FROM. Mild TTP along the left hip joint and anterior thigh. No joint swelling, erythema or warmth. Pt ambulates unassisted with a limp. No indication for imaging at this time. Chart reviewed and pt has been seen multiple times for left hip pain. Will discharge with NSAIDs and conservative measures including, rest, heat or ice. Encouraged pt to follow up with her orthopedist who has been following her for her chronic hip pain. Return precautions given in discharge paperwork and discussed with  pt at bedside. Pt stable for discharge  I personally performed the services described in this documentation, which was scribed in my presence. The recorded information has been reviewed and is accurate.      [name], PA-C 06/19/16 [place], MD 06/19/16 [phone]

## 2016-06-19 NOTE — ED Notes (Signed)
Pt c/o left hip pain x 3 days after working out in the yard. Hx arthritis in hips. Pt reports pain radiating across the thigh and to the knee.

## 2016-06-19 NOTE — Discharge Instructions (Signed)

## 2016-06-23 ENCOUNTER — Ambulatory Visit
Admission: RE | Admit: 2016-06-23 | Discharge: 2016-06-23 | Disposition: A | Discharge status: Home / Self Care | Payer: 59 | Source: Ambulatory Visit | Attending: Neurosurgery | Admitting: Neurosurgery

## 2016-06-23 DIAGNOSIS — M4802 Spinal stenosis, cervical region: Secondary | ICD-10-CM

## 2016-08-01 ENCOUNTER — Other Ambulatory Visit: Payer: Self-pay | Admitting: Neurosurgery

## 2016-08-28 ENCOUNTER — Encounter (HOSPITAL_COMMUNITY): Payer: Self-pay

## 2016-08-28 NOTE — Pre-Procedure Instructions (Signed)
CARLINE KINZER  08/28/2016      [facility] [place], [place] [id number] [place] [address] Phone: [phone] Fax: [phone]  [place], [place] [address] Phone: [phone] Fax: [phone]    Your procedure is scheduled on   Tuesday  09/05/16  Report to [facility] Admitting at 730 A.M.  Call this number if you have problems the morning of surgery:  [phone]   Remember:  Do not eat food or drink liquids after midnight.  Take these medicines the morning of surgery with A SIP OF WATER    (STOP 7 DAYS PRIOR TO SURGERY- ASPIRIN OR ASPIRIN PRODUCTS, IBUPROFEN/ ADVIL/ MOTRIN/ ALEVE, GOODY POWDERS/ BC'S, HERBAL MEDICINES, VITAMINS, VIT E, TURMERIC ETC.)   Do not wear jewelry, make-up or nail polish.  Do not wear lotions, powders, or perfumes, or deoderant.  Do not shave 48 hours prior to surgery.  Men may shave face and neck.  Do not bring valuables to the hospital.  [facility] is not responsible for any belongings or valuables.  Contacts, dentures or bridgework may not be worn into surgery.  Leave your suitcase in the car.  After surgery it may be brought to your room.  For patients admitted to the hospital, discharge time will be determined by your treatment team.  Patients discharged the day of surgery will not be allowed to drive home.   Name and phone number of your driver:    Special instructions:  [facility] - Preparing for Surgery  Before surgery, you can play an important role.  Because skin is not sterile, your skin needs to be as free of germs as possible.  You can reduce the number of germs on you skin by washing with CHG (chlorahexidine gluconate) soap before surgery.  CHG is an antiseptic cleaner which kills germs and bonds with the skin to continue killing germs even after washing.  Please DO NOT use if you have an allergy to CHG or  antibacterial soaps.  If your skin becomes reddened/irritated stop using the CHG and inform your nurse when you arrive at Short Stay.  Do not shave (including legs and underarms) for at least 48 hours prior to the first CHG shower.  You may shave your face.  Please follow these instructions carefully:   1.  Shower with CHG Soap the night before surgery and the                                morning of Surgery.  2.  If you choose to wash your hair, wash your hair first as usual with your       normal shampoo.  3.  After you shampoo, rinse your hair and body thoroughly to remove the                      Shampoo.  4.  Use CHG as you would any other liquid soap.  You can apply chg directly       to the skin and wash gently with scrungie or a clean washcloth.  5.  Apply the CHG Soap to your body ONLY FROM THE NECK DOWN.        Do not use on open wounds or open sores.  Avoid contact with your eyes,  ears, mouth and genitals (private parts).  Wash genitals (private parts)       with your normal soap.  6.  Wash thoroughly, paying special attention to the area where your surgery        will be performed.  7.  Thoroughly rinse your body with warm water from the neck down.  8.  DO NOT shower/wash with your normal soap after using and rinsing off       the CHG Soap.  9.  Pat yourself dry with a clean towel.            10.  Wear clean pajamas.            11.  Place clean sheets on your bed the night of your first shower and do not        sleep with pets.  Day of Surgery  Do not apply any lotions/deoderants the morning of surgery.  Please wear clean clothes to the hospital/surgery center.    Please read over the following fact sheets that you were given. MRSA Information and Surgical Site Infection Prevention

## 2016-08-29 ENCOUNTER — Encounter (HOSPITAL_COMMUNITY)
Admission: RE | Admit: 2016-08-29 | Discharge: 2016-08-29 | Disposition: A | Discharge status: Home / Self Care | Payer: 59 | Source: Ambulatory Visit | Attending: Neurosurgery | Admitting: Neurosurgery

## 2016-08-29 ENCOUNTER — Encounter (HOSPITAL_COMMUNITY): Payer: Self-pay

## 2016-08-29 DIAGNOSIS — Z01812 Encounter for preprocedural laboratory examination: Secondary | ICD-10-CM | POA: Diagnosis present

## 2016-08-29 DIAGNOSIS — G9589 Other specified diseases of spinal cord: Secondary | ICD-10-CM | POA: Insufficient documentation

## 2016-08-29 HISTORY — DX: Headache: R51

## 2016-08-29 HISTORY — DX: Other specified diseases of liver: K76.89

## 2016-08-29 HISTORY — DX: Gastro-esophageal reflux disease without esophagitis: K21.9

## 2016-08-29 HISTORY — DX: Headache, unspecified: R51.9

## 2016-08-29 HISTORY — DX: Autoimmune thyroiditis: E06.3

## 2016-08-29 HISTORY — DX: Fibromyalgia: M79.7

## 2016-08-29 HISTORY — DX: Disorder of parathyroid gland, unspecified: E21.5

## 2016-08-29 HISTORY — DX: Hypothyroidism, unspecified: E03.9

## 2016-08-29 LAB — CBC
HCT: 40.4 % (ref 36.0–46.0)
Hemoglobin: 13 g/dL (ref 12.0–15.0)
MCH: 29.7 pg (ref 26.0–34.0)
MCHC: 32.2 g/dL (ref 30.0–36.0)
MCV: 92.4 fL (ref 78.0–100.0)
Platelets: 243 10*3/uL (ref 150–400)
RBC: 4.37 MIL/uL (ref 3.87–5.11)
RDW: 13.9 % (ref 11.5–15.5)
WBC: 8 10*3/uL (ref 4.0–10.5)

## 2016-08-29 LAB — BASIC METABOLIC PANEL
Anion gap: 6 (ref 5–15)
BUN: 9 mg/dL (ref 6–20)
CO2: 26 mmol/L (ref 22–32)
Calcium: 10.3 mg/dL (ref 8.9–10.3)
Chloride: 105 mmol/L (ref 101–111)
Creatinine, Ser: 0.96 mg/dL (ref 0.44–1.00)
GFR calc Af Amer: >60 mL/min (ref >60)
GFR calc non Af Amer: >60 mL/min (ref >60)
Glucose, Bld: 96 mg/dL (ref 65–99)
Potassium: 4.2 mmol/L (ref 3.5–5.1)
Sodium: 137 mmol/L (ref 135–145)

## 2016-08-29 LAB — SURGICAL PCR SCREEN
MRSA, PCR: NEGATIVE
Staphylococcus aureus: NEGATIVE

## 2016-08-29 LAB — TYPE AND SCREEN
ABO/RH(D): A POS
Antibody Screen: NEGATIVE
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
## 2016-09-05 ENCOUNTER — Encounter (HOSPITAL_COMMUNITY)
Admission: RE | Disposition: A | Discharge status: Home / Self Care | Payer: Self-pay | Source: Ambulatory Visit | Attending: Neurosurgery

## 2016-09-05 ENCOUNTER — Inpatient Hospital Stay (HOSPITAL_COMMUNITY): Payer: 59 | Admitting: Certified Registered"

## 2016-09-05 ENCOUNTER — Inpatient Hospital Stay (HOSPITAL_COMMUNITY)
Admission: RE | Admit: 2016-09-05 | Discharge: 2016-09-07 | DRG: 473 | Disposition: A | Discharge status: Home / Self Care | Payer: 59 | Source: Ambulatory Visit | Attending: Neurosurgery | Admitting: Neurosurgery

## 2016-09-05 ENCOUNTER — Encounter (HOSPITAL_COMMUNITY): Payer: Self-pay | Admitting: *Deleted

## 2016-09-05 ENCOUNTER — Inpatient Hospital Stay (HOSPITAL_COMMUNITY): Payer: 59

## 2016-09-05 DIAGNOSIS — Z6839 Body mass index (BMI) 39.0-39.9, adult: Secondary | ICD-10-CM

## 2016-09-05 DIAGNOSIS — Z91041 Radiographic dye allergy status: Secondary | ICD-10-CM

## 2016-09-05 DIAGNOSIS — Z87891 Personal history of nicotine dependence: Secondary | ICD-10-CM | POA: Diagnosis not present

## 2016-09-05 DIAGNOSIS — Z888 Allergy status to other drugs, medicaments and biological substances status: Secondary | ICD-10-CM

## 2016-09-05 DIAGNOSIS — M542 Cervicalgia: Secondary | ICD-10-CM | POA: Diagnosis present

## 2016-09-05 DIAGNOSIS — M4712 Other spondylosis with myelopathy, cervical region: Secondary | ICD-10-CM | POA: Diagnosis present

## 2016-09-05 DIAGNOSIS — E039 Hypothyroidism, unspecified: Secondary | ICD-10-CM | POA: Diagnosis present

## 2016-09-05 DIAGNOSIS — M5412 Radiculopathy, cervical region: Secondary | ICD-10-CM | POA: Diagnosis present

## 2016-09-05 DIAGNOSIS — Z419 Encounter for procedure for purposes other than remedying health state, unspecified: Secondary | ICD-10-CM

## 2016-09-05 DIAGNOSIS — K219 Gastro-esophageal reflux disease without esophagitis: Secondary | ICD-10-CM | POA: Diagnosis present

## 2016-09-05 DIAGNOSIS — G959 Disease of spinal cord, unspecified: Secondary | ICD-10-CM | POA: Diagnosis present

## 2016-09-05 HISTORY — PX: ANTERIOR CERVICAL DECOMPRESSION/DISCECTOMY FUSION 4 LEVELS: SHX5556

## 2016-09-05 SURGERY — ANTERIOR CERVICAL DECOMPRESSION/DISCECTOMY FUSION 4 LEVELS
Anesthesia: General

## 2016-09-05 MED ORDER — SODIUM CHLORIDE 0.9% FLUSH
3.0000 mL | Freq: Two times a day (BID) | INTRAVENOUS | Status: DC
  Administered 2016-09-05 – 2016-09-06 (×2): 3 mL via INTRAVENOUS

## 2016-09-05 MED ORDER — LIDOCAINE HCL (CARDIAC) 20 MG/ML IV SOLN
INTRAVENOUS | Status: DC | PRN
  Administered 2016-09-05: 60 mg via INTRAVENOUS

## 2016-09-05 MED ORDER — LIDOCAINE 2% (20 MG/ML) 5 ML SYRINGE
INTRAMUSCULAR | Status: AC
  Filled 2016-09-05: qty 5

## 2016-09-05 MED ORDER — HYDROMORPHONE HCL 1 MG/ML IJ SOLN
0.5000 mg | INTRAMUSCULAR | Status: DC | PRN
  Administered 2016-09-05 – 2016-09-06 (×3): 1 mg via INTRAVENOUS
  Filled 2016-09-05 (×3): qty 1

## 2016-09-05 MED ORDER — LACTATED RINGERS IV SOLN
INTRAVENOUS | Status: DC
  Administered 2016-09-05 (×3): via INTRAVENOUS

## 2016-09-05 MED ORDER — SUCCINYLCHOLINE CHLORIDE 200 MG/10ML IV SOSY
PREFILLED_SYRINGE | INTRAVENOUS | Status: AC
  Filled 2016-09-05: qty 10

## 2016-09-05 MED ORDER — IRON 18 MG PO TBCR: 800.0000 mg | EXTENDED_RELEASE_TABLET | Freq: Every day | ORAL | Status: DC

## 2016-09-05 MED ORDER — B COMPLEX-C PO TABS
1.0000 | ORAL_TABLET | Freq: Every day | ORAL | Status: DC
  Administered 2016-09-06 – 2016-09-07 (×2): 1 via ORAL
  Filled 2016-09-05 (×2): qty 1

## 2016-09-05 MED ORDER — TURMERIC 500 MG PO CAPS: 1500.0000 mg | ORAL_CAPSULE | Freq: Three times a day (TID) | ORAL | Status: DC

## 2016-09-05 MED ORDER — SODIUM CHLORIDE 0.9% FLUSH: 3.0000 mL | INTRAVENOUS | Status: DC | PRN

## 2016-09-05 MED ORDER — VITAMIN E 15 UNIT/0.3ML PO SOLN
400.0000 [IU] | Freq: Every day | ORAL | Status: DC
  Administered 2016-09-06: 400 [IU] via ORAL
  Filled 2016-09-05 (×2): qty 8

## 2016-09-05 MED ORDER — HYDROMORPHONE HCL 1 MG/ML IJ SOLN
INTRAMUSCULAR | Status: AC
  Filled 2016-09-05: qty 1

## 2016-09-05 MED ORDER — CYCLOBENZAPRINE HCL 10 MG PO TABS
10.0000 mg | ORAL_TABLET | Freq: Three times a day (TID) | ORAL | Status: DC | PRN
  Administered 2016-09-05 – 2016-09-06 (×3): 10 mg via ORAL
  Filled 2016-09-05 (×2): qty 1

## 2016-09-05 MED ORDER — KCL IN DEXTROSE-NACL 20-5-0.45 MEQ/L-%-% IV SOLN: INTRAVENOUS | Status: DC

## 2016-09-05 MED ORDER — FLEET ENEMA 7-19 GM/118ML RE ENEM
1.0000 | ENEMA | Freq: Once | RECTAL | Status: DC | PRN
Start: 2016-09-05 — End: 2016-09-07

## 2016-09-05 MED ORDER — MIDAZOLAM HCL 2 MG/2ML IJ SOLN
INTRAMUSCULAR | Status: AC
  Filled 2016-09-05: qty 2

## 2016-09-05 MED ORDER — MENTHOL 3 MG MT LOZG
1.0000 | LOZENGE | OROMUCOSAL | Status: DC | PRN
Start: 2016-09-05 — End: 2016-09-07

## 2016-09-05 MED ORDER — SODIUM CHLORIDE 0.9 % IV SOLN: 250.0000 mL | INTRAVENOUS | Status: DC

## 2016-09-05 MED ORDER — BUPIVACAINE HCL (PF) 0.5 % IJ SOLN
INTRAMUSCULAR | Status: DC | PRN
  Administered 2016-09-05: 10 mL

## 2016-09-05 MED ORDER — HYDROMORPHONE HCL 1 MG/ML IJ SOLN
0.2500 mg | INTRAMUSCULAR | Status: DC | PRN
  Administered 2016-09-05 (×4): 0.5 mg via INTRAVENOUS

## 2016-09-05 MED ORDER — THROMBIN 5000 UNITS EX SOLR
OROMUCOSAL | Status: DC | PRN
  Administered 2016-09-05: 13:00:00 via TOPICAL

## 2016-09-05 MED ORDER — FENTANYL CITRATE (PF) 100 MCG/2ML IJ SOLN
INTRAMUSCULAR | Status: AC
  Filled 2016-09-05: qty 2

## 2016-09-05 MED ORDER — PHENOL 1.4 % MT LIQD: 1.0000 | OROMUCOSAL | Status: DC | PRN

## 2016-09-05 MED ORDER — DEXAMETHASONE SODIUM PHOSPHATE 10 MG/ML IJ SOLN
INTRAMUSCULAR | Status: DC | PRN
  Administered 2016-09-05: 10 mg via INTRAVENOUS

## 2016-09-05 MED ORDER — VANCOMYCIN HCL IN DEXTROSE 750-5 MG/150ML-% IV SOLN
750.0000 mg | Freq: Two times a day (BID) | INTRAVENOUS | Status: DC
  Administered 2016-09-05 – 2016-09-07 (×4): 750 mg via INTRAVENOUS
  Filled 2016-09-05 (×7): qty 150

## 2016-09-05 MED ORDER — PHENYLEPHRINE HCL 10 MG/ML IJ SOLN
INTRAVENOUS | Status: DC | PRN
  Administered 2016-09-05: 30 ug/min via INTRAVENOUS

## 2016-09-05 MED ORDER — CYCLOBENZAPRINE HCL 5 MG PO TABS: 5.0000 mg | ORAL_TABLET | Freq: Two times a day (BID) | ORAL | Status: DC | PRN

## 2016-09-05 MED ORDER — METHOCARBAMOL 500 MG PO TABS
500.0000 mg | ORAL_TABLET | Freq: Four times a day (QID) | ORAL | Status: DC | PRN
  Administered 2016-09-06 – 2016-09-07 (×2): 500 mg via ORAL
  Filled 2016-09-05 (×2): qty 1

## 2016-09-05 MED ORDER — ACETAMINOPHEN 325 MG PO TABS
650.0000 mg | ORAL_TABLET | ORAL | Status: DC | PRN
  Filled 2016-09-05: qty 2

## 2016-09-05 MED ORDER — ALUM & MAG HYDROXIDE-SIMETH 200-200-20 MG/5ML PO SUSP: 30.0000 mL | Freq: Four times a day (QID) | ORAL | Status: DC | PRN

## 2016-09-05 MED ORDER — VANCOMYCIN HCL IN DEXTROSE 1-5 GM/200ML-% IV SOLN
INTRAVENOUS | Status: AC
  Filled 2016-09-05: qty 200

## 2016-09-05 MED ORDER — ONDANSETRON HCL 4 MG/2ML IJ SOLN
INTRAMUSCULAR | Status: DC | PRN
  Administered 2016-09-05: 4 mg via INTRAVENOUS

## 2016-09-05 MED ORDER — ROCURONIUM BROMIDE 100 MG/10ML IV SOLN
INTRAVENOUS | Status: DC | PRN
  Administered 2016-09-05: 20 mg via INTRAVENOUS
  Administered 2016-09-05: 50 mg via INTRAVENOUS
  Administered 2016-09-05: 20 mg via INTRAVENOUS

## 2016-09-05 MED ORDER — PHENYLEPHRINE HCL 10 MG/ML IJ SOLN
INTRAMUSCULAR | Status: DC | PRN
  Administered 2016-09-05 (×2): 120 ug via INTRAVENOUS
  Administered 2016-09-05: 80 ug via INTRAVENOUS

## 2016-09-05 MED ORDER — VANCOMYCIN HCL IN DEXTROSE 1-5 GM/200ML-% IV SOLN
1000.0000 mg | INTRAVENOUS | Status: AC
Start: 2016-09-05 — End: 2016-09-05
  Administered 2016-09-05: 1000 mg via INTRAVENOUS

## 2016-09-05 MED ORDER — ACETYLCYSTEINE 500 MG PO CAPS: 500.0000 mg | ORAL_CAPSULE | Freq: Every day | ORAL | Status: DC

## 2016-09-05 MED ORDER — OXYCODONE-ACETAMINOPHEN 5-325 MG PO TABS
1.0000 | ORAL_TABLET | ORAL | Status: DC | PRN
  Administered 2016-09-06 – 2016-09-07 (×3): 2 via ORAL
  Filled 2016-09-05 (×3): qty 2

## 2016-09-05 MED ORDER — FENTANYL CITRATE (PF) 100 MCG/2ML IJ SOLN
INTRAMUSCULAR | Status: DC | PRN
  Administered 2016-09-05 (×2): 50 ug via INTRAVENOUS
  Administered 2016-09-05: 100 ug via INTRAVENOUS
  Administered 2016-09-05 (×2): 50 ug via INTRAVENOUS

## 2016-09-05 MED ORDER — SUGAMMADEX SODIUM 500 MG/5ML IV SOLN
INTRAVENOUS | Status: DC | PRN
  Administered 2016-09-05: 220 mg via INTRAVENOUS

## 2016-09-05 MED ORDER — MAGNESIUM 200 MG PO TABS
500.0000 mg | ORAL_TABLET | Freq: Every day | ORAL | Status: DC
Start: 2016-09-06 — End: 2016-09-06
  Filled 2016-09-05: qty 3

## 2016-09-05 MED ORDER — FERROUS SULFATE 325 (65 FE) MG PO TABS
325.0000 mg | ORAL_TABLET | Freq: Every day | ORAL | Status: DC
  Administered 2016-09-06 – 2016-09-07 (×2): 325 mg via ORAL
  Filled 2016-09-05 (×2): qty 1

## 2016-09-05 MED ORDER — CYCLOBENZAPRINE HCL 10 MG PO TABS
ORAL_TABLET | ORAL | Status: AC
  Filled 2016-09-05: qty 1

## 2016-09-05 MED ORDER — CHLORHEXIDINE GLUCONATE CLOTH 2 % EX PADS: 6.0000 | MEDICATED_PAD | Freq: Once | CUTANEOUS | Status: DC

## 2016-09-05 MED ORDER — SENNOSIDES-DOCUSATE SODIUM 8.6-50 MG PO TABS: 1.0000 | ORAL_TABLET | Freq: Every evening | ORAL | Status: DC | PRN

## 2016-09-05 MED ORDER — DOCUSATE SODIUM 100 MG PO CAPS
100.0000 mg | ORAL_CAPSULE | Freq: Two times a day (BID) | ORAL | Status: DC
  Administered 2016-09-05 – 2016-09-07 (×4): 100 mg via ORAL
  Filled 2016-09-05 (×4): qty 1

## 2016-09-05 MED ORDER — THROMBIN 20000 UNITS EX SOLR
CUTANEOUS | Status: DC | PRN
  Administered 2016-09-05: 12:00:00 via TOPICAL

## 2016-09-05 MED ORDER — ONDANSETRON HCL 4 MG/2ML IJ SOLN
4.0000 mg | INTRAMUSCULAR | Status: DC | PRN
  Administered 2016-09-05 – 2016-09-06 (×2): 4 mg via INTRAVENOUS
  Filled 2016-09-05 (×2): qty 2

## 2016-09-05 MED ORDER — VITAMIN D 1000 UNITS PO TABS
5000.0000 [IU] | ORAL_TABLET | Freq: Every day | ORAL | Status: DC
  Administered 2016-09-06: 5000 [IU] via ORAL
  Filled 2016-09-05 (×2): qty 5

## 2016-09-05 MED ORDER — PANTOPRAZOLE SODIUM 40 MG PO TBEC
40.0000 mg | DELAYED_RELEASE_TABLET | Freq: Two times a day (BID) | ORAL | Status: DC
  Administered 2016-09-05 – 2016-09-07 (×4): 40 mg via ORAL
  Filled 2016-09-05 (×4): qty 1

## 2016-09-05 MED ORDER — PUMPKIN SEED OIL PO CAPS: ORAL_CAPSULE | Freq: Every day | ORAL | Status: DC

## 2016-09-05 MED ORDER — SUCCINYLCHOLINE CHLORIDE 20 MG/ML IJ SOLN
INTRAMUSCULAR | Status: DC | PRN
  Administered 2016-09-05: 100 mg via INTRAVENOUS

## 2016-09-05 MED ORDER — METHOCARBAMOL 1000 MG/10ML IJ SOLN
500.0000 mg | Freq: Four times a day (QID) | INTRAVENOUS | Status: DC | PRN
  Filled 2016-09-05: qty 5

## 2016-09-05 MED ORDER — ONDANSETRON HCL 4 MG/2ML IJ SOLN
INTRAMUSCULAR | Status: AC
  Filled 2016-09-05: qty 2

## 2016-09-05 MED ORDER — PROPOFOL 10 MG/ML IV BOLUS
INTRAVENOUS | Status: AC
  Filled 2016-09-05: qty 20

## 2016-09-05 MED ORDER — PROPOFOL 10 MG/ML IV BOLUS
INTRAVENOUS | Status: DC | PRN
  Administered 2016-09-05: 140 mg via INTRAVENOUS
  Administered 2016-09-05: 20 mg via INTRAVENOUS

## 2016-09-05 MED ORDER — B COMPLEX PO TABS: 1.0000 | ORAL_TABLET | Freq: Every day | ORAL | Status: DC

## 2016-09-05 MED ORDER — SUGAMMADEX SODIUM 500 MG/5ML IV SOLN
INTRAVENOUS | Status: AC
  Filled 2016-09-05: qty 5

## 2016-09-05 MED ORDER — MIDAZOLAM HCL 5 MG/5ML IJ SOLN
INTRAMUSCULAR | Status: DC | PRN
  Administered 2016-09-05: 2 mg via INTRAVENOUS

## 2016-09-05 MED ORDER — 0.9 % SODIUM CHLORIDE (POUR BTL) OPTIME
TOPICAL | Status: DC | PRN
  Administered 2016-09-05: 1000 mL

## 2016-09-05 MED ORDER — LIDOCAINE-EPINEPHRINE 1 %-1:100000 IJ SOLN
INTRAMUSCULAR | Status: DC | PRN
  Administered 2016-09-05: 10 mL

## 2016-09-05 MED ORDER — ROCURONIUM BROMIDE 10 MG/ML (PF) SYRINGE
PREFILLED_SYRINGE | INTRAVENOUS | Status: AC
  Filled 2016-09-05: qty 10

## 2016-09-05 MED ORDER — ALBUMIN HUMAN 5 % IV SOLN
INTRAVENOUS | Status: DC | PRN
  Administered 2016-09-05: 14:00:00 via INTRAVENOUS

## 2016-09-05 MED ORDER — VANCOMYCIN HCL IN DEXTROSE 1-5 GM/200ML-% IV SOLN: 1000.0000 mg | Freq: Once | INTRAVENOUS | Status: DC

## 2016-09-05 MED ORDER — ACETAMINOPHEN 650 MG RE SUPP: 650.0000 mg | RECTAL | Status: DC | PRN

## 2016-09-05 MED ORDER — FENTANYL CITRATE (PF) 100 MCG/2ML IJ SOLN
INTRAMUSCULAR | Status: AC
  Filled 2016-09-05: qty 4

## 2016-09-05 MED ORDER — RISAQUAD PO CAPS
ORAL_CAPSULE | Freq: Every day | ORAL | Status: DC
  Administered 2016-09-06 – 2016-09-07 (×2): 1 via ORAL
  Filled 2016-09-05 (×2): qty 1

## 2016-09-05 MED ORDER — ADULT MULTIVITAMIN W/MINERALS CH
1.0000 | ORAL_TABLET | Freq: Every day | ORAL | Status: DC
  Administered 2016-09-06: 1 via ORAL
  Filled 2016-09-05: qty 1

## 2016-09-05 MED ORDER — HYDROCODONE-ACETAMINOPHEN 5-325 MG PO TABS: 1.0000 | ORAL_TABLET | ORAL | Status: DC | PRN

## 2016-09-05 MED ORDER — BISACODYL 10 MG RE SUPP: 10.0000 mg | Freq: Every day | RECTAL | Status: DC | PRN

## 2016-09-05 SURGICAL SUPPLY — 69 items
BIT DRILL 12X2.5XAVTR (BIT) ×1 IMPLANT
BIT DRILL AVIATOR 12 (BIT) ×1
BIT DRILL NEURO 2X3.1 SFT TUCH (MISCELLANEOUS) ×1 IMPLANT
BIT DRL 12X2.5XAVTR (BIT) ×1
BLADE ULTRA TIP 2M (BLADE) IMPLANT
BNDG GAUZE ELAST 4 BULKY (GAUZE/BANDAGES/DRESSINGS) ×4 IMPLANT
BUR BARREL STRAIGHT FLUTE 4.0 (BURR) ×4 IMPLANT
CANISTER SUCT 3000ML PPV (MISCELLANEOUS) ×2 IMPLANT
COVER MAYO STAND STRL (DRAPES) ×2 IMPLANT
DECANTER SPIKE VIAL GLASS SM (MISCELLANEOUS) ×2 IMPLANT
DERMABOND ADVANCED (GAUZE/BANDAGES/DRESSINGS) ×1
DERMABOND ADVANCED .7 DNX12 (GAUZE/BANDAGES/DRESSINGS) ×1 IMPLANT
DRAIN CHANNEL 10M FLAT 3/4 FLT (DRAIN) ×2 IMPLANT
DRAPE HALF SHEET 40X57 (DRAPES) IMPLANT
DRAPE LAPAROTOMY 100X72 PEDS (DRAPES) ×2 IMPLANT
DRAPE MICROSCOPE LEICA (MISCELLANEOUS) ×2 IMPLANT
DRAPE POUCH INSTRU U-SHP 10X18 (DRAPES) ×2 IMPLANT
DRILL NEURO 2X3.1 SOFT TOUCH (MISCELLANEOUS) ×2
DRSG OPSITE POSTOP 4X6 (GAUZE/BANDAGES/DRESSINGS) ×2 IMPLANT
DURAPREP 6ML APPLICATOR 50/CS (WOUND CARE) ×2 IMPLANT
ELECT COATED BLADE 2.86 ST (ELECTRODE) ×2 IMPLANT
ELECT REM PT RETURN 9FT ADLT (ELECTROSURGICAL) ×2
ELECTRODE REM PT RTRN 9FT ADLT (ELECTROSURGICAL) ×1 IMPLANT
EVACUATOR SILICONE 100CC (DRAIN) ×2 IMPLANT
GAUZE SPONGE 4X4 12PLY STRL (GAUZE/BANDAGES/DRESSINGS) IMPLANT
GAUZE SPONGE 4X4 16PLY XRAY LF (GAUZE/BANDAGES/DRESSINGS) IMPLANT
GLOVE BIO SURGEON STRL SZ8 (GLOVE) ×2 IMPLANT
GLOVE BIOGEL PI IND STRL 8 (GLOVE) ×1 IMPLANT
GLOVE BIOGEL PI IND STRL 8.5 (GLOVE) ×1 IMPLANT
GLOVE BIOGEL PI INDICATOR 8 (GLOVE) ×1
GLOVE BIOGEL PI INDICATOR 8.5 (GLOVE) ×1
GLOVE ECLIPSE 7.5 STRL STRAW (GLOVE) ×10 IMPLANT
GLOVE EXAM NITRILE LRG STRL (GLOVE) IMPLANT
GLOVE EXAM NITRILE XL STR (GLOVE) IMPLANT
GLOVE EXAM NITRILE XS STR PU (GLOVE) IMPLANT
GLOVE INDICATOR 7.5 STRL GRN (GLOVE) ×2 IMPLANT
GLOVE INDICATOR 8.0 STRL GRN (GLOVE) ×4 IMPLANT
GOWN STRL REUS W/ TWL LRG LVL3 (GOWN DISPOSABLE) ×1 IMPLANT
GOWN STRL REUS W/ TWL XL LVL3 (GOWN DISPOSABLE) ×1 IMPLANT
GOWN STRL REUS W/TWL 2XL LVL3 (GOWN DISPOSABLE) ×2 IMPLANT
GOWN STRL REUS W/TWL LRG LVL3 (GOWN DISPOSABLE) ×1
GOWN STRL REUS W/TWL XL LVL3 (GOWN DISPOSABLE) ×1
HALTER HD/CHIN CERV TRACTION D (MISCELLANEOUS) ×2 IMPLANT
HEMOSTAT POWDER KIT SURGIFOAM (HEMOSTASIS) ×2 IMPLANT
KIT BASIN OR (CUSTOM PROCEDURE TRAY) ×2 IMPLANT
KIT ROOM TURNOVER OR (KITS) ×2 IMPLANT
NEEDLE HYPO 18GX1.5 BLUNT FILL (NEEDLE) ×2 IMPLANT
NEEDLE HYPO 25X1 1.5 SAFETY (NEEDLE) ×2 IMPLANT
NEEDLE SPNL 22GX3.5 QUINCKE BK (NEEDLE) ×4 IMPLANT
NS IRRIG 1000ML POUR BTL (IV SOLUTION) ×2 IMPLANT
PACK LAMINECTOMY NEURO (CUSTOM PROCEDURE TRAY) ×2 IMPLANT
PAD ARMBOARD 7.5X6 YLW CONV (MISCELLANEOUS) ×2 IMPLANT
PEEK AVS AS 6X12X4 (Peek) ×6 IMPLANT
PIN DISTRACTION 14MM (PIN) ×4 IMPLANT
PLATE AVIATOR ASSY 4LVL SZ 60 (Plate) ×2 IMPLANT
PUTTY DBX 1CC (Putty) ×4 IMPLANT
PUTTY DBX 1CC DEPUY (Putty) ×2 IMPLANT
RUBBERBAND STERILE (MISCELLANEOUS) ×8 IMPLANT
SCREW AVIATOR VAR SELFTAP 4X12 (Screw) ×20 IMPLANT
SPACER AVS AS 12X14X5X0 (Orthopedic Implant) ×2 IMPLANT
SPONGE INTESTINAL PEANUT (DISPOSABLE) ×2 IMPLANT
SPONGE SURGIFOAM ABS GEL 100 (HEMOSTASIS) ×2 IMPLANT
STAPLER SKIN PROX WIDE 3.9 (STAPLE) ×2 IMPLANT
SUT VIC AB 3-0 SH 8-18 (SUTURE) ×2 IMPLANT
SYR 3ML LL SCALE MARK (SYRINGE) IMPLANT
TOWEL OR 17X24 6PK STRL BLUE (TOWEL DISPOSABLE) ×2 IMPLANT
TOWEL OR 17X26 10 PK STRL BLUE (TOWEL DISPOSABLE) ×2 IMPLANT
TRAP SPECIMEN MUCOUS 40CC (MISCELLANEOUS) ×2 IMPLANT
WATER STERILE IRR 1000ML POUR (IV SOLUTION) ×2 IMPLANT

## 2016-09-05 NOTE — Interval H&P Note (Signed)
History and Physical Interval Note:  09/05/2016 11:12 AM  Meghan Owen  has presented today for surgery, with the diagnosis of Cervical myelopathy  The various methods of treatment have been discussed with the patient and family. After consideration of risks, benefits and other options for treatment, the patient has consented to  Procedure(s) with comments: C3-4 C4-5 C5-6 C6-7 Anterior cervical decompression/diskectomy/fusion (N/A) - C3-4 C4-5 C5-6 C6-7 Anterior cervical decompression/diskectomy/fusion as a surgical intervention .  The patient's history has been reviewed, patient examined, no change in status, stable for surgery.  I have reviewed the patient's chart and labs.  Questions were answered to the patient's satisfaction.     Correy Weidner D

## 2016-09-05 NOTE — Anesthesia Procedure Notes (Signed)
Procedure Name: Intubation Date/Time: 09/05/2016 11:31 AM Performed by: Lance Coon Pre-anesthesia Checklist: Patient identified, Emergency Drugs available, Suction available, Patient being monitored and Timeout performed Patient Re-evaluated:Patient Re-evaluated prior to inductionOxygen Delivery Method: Circle system utilized Preoxygenation: Pre-oxygenation with 100% oxygen Intubation Type: IV induction Ventilation: Mask ventilation without difficulty Laryngoscope Size: Glidescope and 4 Grade View: Grade I Tube type: Oral Tube size: 7.0 mm Number of attempts: 1 Airway Equipment and Method: Stylet Placement Confirmation: ETT inserted through vocal cords under direct vision,  breath sounds checked- equal and bilateral and positive ETCO2 Secured at: 22 cm Tube secured with: Tape Dental Injury: Teeth and Oropharynx as per pre-operative assessment

## 2016-09-05 NOTE — Progress Notes (Signed)
Pharmacy Antibiotic Note  Meghan Owen is a 60 y.o. female admitted on 09/05/2016 with surgical prophylaxis.  Pharmacy has been consulted for vancomycin dosing. Pt is post-op Anterior cervical decompression/diskectomy/fusion. Pt has a drain present. Will continue vancomycin dosing scheduled for now.  Plan: Vancomycin 750 mg IV every 12 hours Monitor renal function, abx plan/LOT Vancomycin trough as indicated   Height: 5\' 5"  (165.1 cm) Weight: 240 lb (108.9 kg) IBW/kg (Calculated) : 57  Temp (24hrs), Avg:97.7 F (36.5 C), Min:97.2 F (36.2 C), Max:98.4 F (36.9 C)  No results for input(s): WBC, CREATININE, LATICACIDVEN, VANCOTROUGH, VANCOPEAK, VANCORANDOM, GENTTROUGH, GENTPEAK, GENTRANDOM, TOBRATROUGH, TOBRAPEAK, TOBRARND, AMIKACINPEAK, AMIKACINTROU, AMIKACIN in the last 168 hours.  Estimated Creatinine Clearance: 76.5 mL/min (by C-G formula based on SCr of 0.96 mg/dL).    Allergies  Allergen Reactions  . Aspirin Palpitations and Other (See Comments)    Aspirin with caffeine only. Causes tachycardia  . Contrast Media [Iodinated Diagnostic Agents] Other (See Comments)    Hot flashes   . Iodine-131 Other (See Comments)    flushing  . Ioxaglate Other (See Comments)    Hot flashes   . Penicillins Other (See Comments)    Makes have more infections   . Prednisone Other (See Comments)    Repeats infections      Thank you for allowing [place] to participate in this patients care. Jens Som, PharmD Pager: [phone] 09/05/2016 5:50 PM

## 2016-09-05 NOTE — Brief Op Note (Signed)
09/05/2016  3:33 PM  PATIENT:  Meghan Owen  60 y.o. female  PRE-OPERATIVE DIAGNOSIS:  Cervical myelopathy, stenosis, cord compression, radiculopathy, cervicalgia C 34, C 45, C 56, C 67 levels  POST-OPERATIVE DIAGNOSIS:  Cervical myelopathy, stenosis, cord compression, radiculopathy, cervicalgia C 34, C 45, C 56, C 67 levels  PROCEDURE:  Procedure(s): Cervical three-four Cervical four-five  Cervical five-six Cervical six-seven  Anterior cervical decompression/diskectomy/fusion (N/A) with PEEK cages, autograft, DBM, plate  SURGEON:  Surgeon(s) and Role:    * Erline Levine, MD - Primary  PHYSICIAN ASSISTANT: Kathyrn Sheriff, MD  ASSISTANTS: Poteat, RN   ANESTHESIA:   general  EBL:  Total I/O In: 1250 [I.V.:1000; IV Piggyback:250] Out: 770 [Urine:425; Blood:345]  BLOOD ADMINISTERED:none  DRAINS: (10) Jackson-Pratt drain(s) with closed bulb suction in the prevertebral space   LOCAL MEDICATIONS USED:  LIDOCAINE   SPECIMEN:  No Specimen  DISPOSITION OF SPECIMEN:  N/A  COUNTS:  YES  TOURNIQUET:  * No tourniquets in log *  DICTATION: Patient was brought to operating room and following the smooth and uncomplicated induction of general endotracheal anesthesia her head was placed on a horseshoe head holder she was placed in 5 pounds of Holter traction and her anterior neck was prepped and draped in usual sterile fashion. An incision was made on the left side of midline after infiltrating the skin and subcutaneous tissues with local lidocaine. The platysmal layer was incised and subplatysmal dissection was performed exposing the anterior border sternocleidomastoid muscle. Using blunt dissection the carotid sheath was kept lateral and trachea and esophagus kept medial exposing the anterior cervical spine. The patient had an extremely large internal jugular vein, which was assiduously avoided throughout the case.  A bent spinal needle was placed it was felt to be the C3-4 level and this was  confirmed on intraoperative x-ray. Longus coli muscles were taken down from the anterior cervical spine using electrocautery and key elevator and self-retaining retractor was placed. The interspaces from C3/4 to C6/7  were incised and a thorough discectomies were performed. Distraction pins were placed at the C 45 level. Uncinate spurs and central spondylitic ridges were drilled down with a high-speed drill. The spinal cord dura and both C5 nerve roots were widely decompressed. Hemostasis was assured. After trial sizing a 6 mm peek interbody cage was selected and packed with local autograft and DBM. The graft was tamped into position and countersunk appropriately.  Distraction pins were placed at the C 34 level. Uncinate spurs and central spondylitic ridges were drilled down with a high-speed drill. The spinal cord dura and both C4 nerve roots were widely decompressed. Hemostasis was assured. After trial sizing a 5 mm peek interbody cage was selected and packed in a similar fashion. The graft was tamped into position and countersunk appropriately. The retractor was moved and the interspace at C6/7 was incised and a thorough discectomy was performed. Distraction pins were placed. Uncinate spurs and central spondylitic ridges were drilled down with a high-speed drill. The spinal cord dura and both C7 nerve roots were widely decompressed. Hemostasis was assured. After trial sizing a 6 mm peek interbody cage was selected and packed in a similar fashion. The graft was tamped into position and countersunk appropriately.The interspace at C5/6 was incised and a thorough discectomy was performed. Distraction pins were placed. Uncinate spurs and central spondylitic ridges were drilled down with a high-speed drill.   This interspace was overgrown with bone and it was drilled open with the high speed drill. The  spinal cord dura and both C6 nerve roots were widely decompressed. There were bridging osteophytes, which were drilled  down.  Hemostasis was assured. After trial sizing a 6 mm peek interbody cage was selected and packed in a similar fashion. The graft was tamped into position and countersunk appropriately.  Distraction weight was removed. A 60 mm Stryker Aviator anterior cervical plate was affixed to the cervical spine with 12 mm variable-angle screws 2 at C3, 2 at C4, 2 at C5,  and 2 at C6, and 2 at C7.  All screws were well-positioned and locking mechanisms were engaged. Soft tissues were inspected and found to be in good repair. The wound was irrigated. A final x-ray was obtained with good visualization at C3 only. A #10 JP drain was inserted through a separate stab incision. The platysma layer was closed with 3-0 Vicryl stitches and the skin was reapproximated with 3-0 Vicryl subcuticular stitches. The wound was dressed with Dermabond and an occlusive dressing. Counts were correct at the end of the case. Patient was extubated and taken to recovery in stable and satisfactory condition.   PLAN OF CARE: Admit to inpatient   PATIENT DISPOSITION:  PACU - hemodynamically stable.   Delay start of Pharmacological VTE agent (>24hrs) due to surgical blood loss or risk of bleeding: yes

## 2016-09-05 NOTE — Progress Notes (Signed)
Awake, alert, conversant.  MAEW with full power (bilateral deltoids, biceps, triceps, hand intrinsics and legs).  Doing well.

## 2016-09-05 NOTE — Transfer of Care (Signed)
Immediate Anesthesia Transfer of Care Note  Patient: Meghan Owen  Procedure(s) Performed: Procedure(s): Cervical three-four Cervical four-five  Cervical five-six Cervical six-seven  Anterior cervical decompression/diskectomy/fusion (N/A)  Patient Location: PACU  Anesthesia Type:General  Level of Consciousness: awake, alert  and patient cooperative  Airway & Oxygen Therapy: Patient Spontanous Breathing and Patient connected to face mask oxygen  Post-op Assessment: Report given to RN and Post -op Vital signs reviewed and stable  Post vital signs: Reviewed and stable  Last Vitals:  Vitals:   09/05/16 0801 09/05/16 1538  BP: 126/67   Pulse: (!) 59   Resp: 20   Temp: 36.9 C (P) 36.2 C    Last Pain:  Vitals:   09/05/16 0801  TempSrc: Oral         Complications: No apparent anesthesia complications

## 2016-09-05 NOTE — Anesthesia Postprocedure Evaluation (Signed)
Anesthesia Post Note  Patient: Meghan Owen  Procedure(s) Performed: Procedure(s) (LRB): Cervical three-four Cervical four-five  Cervical five-six Cervical six-seven  Anterior cervical decompression/diskectomy/fusion (N/A)  Patient location during evaluation: PACU Anesthesia Type: General Level of consciousness: awake and alert Pain management: pain level controlled Vital Signs Assessment: post-procedure vital signs reviewed and stable Respiratory status: spontaneous breathing, nonlabored ventilation, respiratory function stable and patient connected to nasal cannula oxygen Cardiovascular status: blood pressure returned to baseline and stable Postop Assessment: no signs of nausea or vomiting Anesthetic complications: no    Last Vitals:  Vitals:   09/05/16 1635 09/05/16 1639  BP:  127/76  Pulse:  68  Resp: 18   Temp:      Last Pain:  Vitals:   09/05/16 1604  TempSrc:   PainSc: 10-Worst pain ever                 Deundre Thong,W. EDMOND

## 2016-09-05 NOTE — Anesthesia Preprocedure Evaluation (Addendum)
Anesthesia Evaluation  Patient identified by MRN, date of birth, ID band Patient awake    Reviewed: Allergy & Precautions, H&P , NPO status , Patient's Chart, lab work & pertinent test results  Airway Mallampati: I  TM Distance: >3 FB Neck ROM: Full    Dental no notable dental hx. (+) Teeth Intact, Dental Advisory Given   Pulmonary neg pulmonary ROS, former smoker,    Pulmonary exam normal breath sounds clear to auscultation       Cardiovascular negative cardio ROS   Rhythm:Regular Rate:Normal     Neuro/Psych  Headaches, negative psych ROS   GI/Hepatic Neg liver ROS, GERD  Controlled,  Endo/Other  Hypothyroidism Morbid obesity  Renal/GU negative Renal ROS  negative genitourinary   Musculoskeletal  (+) Arthritis , Osteoarthritis,  Fibromyalgia -  Abdominal   Peds  Hematology negative hematology ROS (+)   Anesthesia Other Findings   Reproductive/Obstetrics negative OB ROS                            Anesthesia Physical Anesthesia Plan  ASA: II  Anesthesia Plan: General   Post-op Pain Management:    Induction: Intravenous  Airway Management Planned: Oral ETT  Additional Equipment:   Intra-op Plan:   Post-operative Plan: Extubation in OR  Informed Consent: I have reviewed the patients History and Physical, chart, labs and discussed the procedure including the risks, benefits and alternatives for the proposed anesthesia with the patient or authorized representative who has indicated his/her understanding and acceptance.   Dental advisory given  Plan Discussed with: CRNA  Anesthesia Plan Comments:         Anesthesia Quick Evaluation

## 2016-09-05 NOTE — Op Note (Signed)
09/05/2016  3:33 PM  PATIENT:  Meghan Owen  60 y.o. female  PRE-OPERATIVE DIAGNOSIS:  Cervical myelopathy, stenosis, cord compression, radiculopathy, cervicalgia C 34, C 45, C 56, C 67 levels  POST-OPERATIVE DIAGNOSIS:  Cervical myelopathy, stenosis, cord compression, radiculopathy, cervicalgia C 34, C 45, C 56, C 67 levels  PROCEDURE:  Procedure(s): Cervical three-four Cervical four-five  Cervical five-six Cervical six-seven  Anterior cervical decompression/diskectomy/fusion (N/A) with PEEK cages, autograft, DBM, plate  SURGEON:  Surgeon(s) and Role:    * Erline Levine, MD - Primary  PHYSICIAN ASSISTANT: Kathyrn Sheriff, MD  ASSISTANTS: Poteat, RN   ANESTHESIA:   general  EBL:  Total I/O In: 1250 [I.V.:1000; IV Piggyback:250] Out: 770 [Urine:425; Blood:345]  BLOOD ADMINISTERED:none  DRAINS: (10) Jackson-Pratt drain(s) with closed bulb suction in the prevertebral space   LOCAL MEDICATIONS USED:  LIDOCAINE   SPECIMEN:  No Specimen  DISPOSITION OF SPECIMEN:  N/A  COUNTS:  YES  TOURNIQUET:  * No tourniquets in log *  DICTATION: Patient was brought to operating room and following the smooth and uncomplicated induction of general endotracheal anesthesia her head was placed on a horseshoe head holder she was placed in 5 pounds of Holter traction and her anterior neck was prepped and draped in usual sterile fashion. An incision was made on the left side of midline after infiltrating the skin and subcutaneous tissues with local lidocaine. The platysmal layer was incised and subplatysmal dissection was performed exposing the anterior border sternocleidomastoid muscle. Using blunt dissection the carotid sheath was kept lateral and trachea and esophagus kept medial exposing the anterior cervical spine. The patient had an extremely large internal jugular vein, which was assiduously avoided throughout the case.  A bent spinal needle was placed it was felt to be the C3-4 level and this was  confirmed on intraoperative x-ray. Longus coli muscles were taken down from the anterior cervical spine using electrocautery and key elevator and self-retaining retractor was placed. The interspaces from C3/4 to C6/7  were incised and a thorough discectomies were performed. Distraction pins were placed at the C 45 level. Uncinate spurs and central spondylitic ridges were drilled down with a high-speed drill. The spinal cord dura and both C5 nerve roots were widely decompressed. Hemostasis was assured. After trial sizing a 6 mm peek interbody cage was selected and packed with local autograft and DBM. The graft was tamped into position and countersunk appropriately.  Distraction pins were placed at the C 34 level. Uncinate spurs and central spondylitic ridges were drilled down with a high-speed drill. The spinal cord dura and both C4 nerve roots were widely decompressed. Hemostasis was assured. After trial sizing a 5 mm peek interbody cage was selected and packed in a similar fashion. The graft was tamped into position and countersunk appropriately. The retractor was moved and the interspace at C6/7 was incised and a thorough discectomy was performed. Distraction pins were placed. Uncinate spurs and central spondylitic ridges were drilled down with a high-speed drill. The spinal cord dura and both C7 nerve roots were widely decompressed. Hemostasis was assured. After trial sizing a 6 mm peek interbody cage was selected and packed in a similar fashion. The graft was tamped into position and countersunk appropriately.The interspace at C5/6 was incised and a thorough discectomy was performed. Distraction pins were placed. Uncinate spurs and central spondylitic ridges were drilled down with a high-speed drill.   This interspace was overgrown with bone and it was drilled open with the high speed drill. The  spinal cord dura and both C6 nerve roots were widely decompressed. There were bridging osteophytes, which were drilled  down.  Hemostasis was assured. After trial sizing a 6 mm peek interbody cage was selected and packed in a similar fashion. The graft was tamped into position and countersunk appropriately.  Distraction weight was removed. A 60 mm Stryker Aviator anterior cervical plate was affixed to the cervical spine with 12 mm variable-angle screws 2 at C3, 2 at C4, 2 at C5,  and 2 at C6, and 2 at C7.  All screws were well-positioned and locking mechanisms were engaged. Soft tissues were inspected and found to be in good repair. The wound was irrigated. A final x-ray was obtained with good visualization at C3 only. A #10 JP drain was inserted through a separate stab incision. The platysma layer was closed with 3-0 Vicryl stitches and the skin was reapproximated with 3-0 Vicryl subcuticular stitches. The wound was dressed with Dermabond and an occlusive dressing. Counts were correct at the end of the case. Patient was extubated and taken to recovery in stable and satisfactory condition.   PLAN OF CARE: Admit to inpatient   PATIENT DISPOSITION:  PACU - hemodynamically stable.   Delay start of Pharmacological VTE agent (>24hrs) due to surgical blood loss or risk of bleeding: yes

## 2016-09-06 ENCOUNTER — Encounter (HOSPITAL_COMMUNITY): Payer: Self-pay | Admitting: Neurosurgery

## 2016-09-06 MED ORDER — MAGNESIUM OXIDE 400 (241.3 MG) MG PO TABS
400.0000 mg | ORAL_TABLET | Freq: Every day | ORAL | Status: DC
  Administered 2016-09-06 – 2016-09-07 (×2): 400 mg via ORAL
  Filled 2016-09-06 (×2): qty 1

## 2016-09-06 NOTE — Progress Notes (Signed)
Subjective: Patient reports feels stronger.  Was nauseated through the night, now improved  Objective: Vital signs in last 24 hours: Temp:  [97.2 F (36.2 C)-99.1 F (37.3 C)] 99.1 F (37.3 C) (09/27 0349) Pulse Rate:  [59-91] 75 (09/27 0349) Resp:  [10-23] 18 (09/27 0349) BP: (109-147)/(59-82) 109/59 (09/27 0349) SpO2:  [93 %-99 %] 96 % (09/27 0349)  Intake/Output from previous day: 09/26 0701 - 09/27 0700 In: 2490 [P.O.:240; I.V.:2000; IV Piggyback:250] Out: [id number]; Drains:20; Blood:345] Intake/Output this shift: No intake/output data recorded.  Physical Exam: Strength full bilateral D/B/T/HI.  MAEW.  Less numbness.  Sore in neck.  Lab Results: No results for input(s): WBC, HGB, HCT, PLT in the last 72 hours. BMET No results for input(s): NA, K, CL, CO2, GLUCOSE, BUN, CREATININE, CALCIUM in the last 72 hours.  Studies/Results: Dg Cervical Spine 2-3 Views  Result Date: 09/05/2016 CLINICAL DATA:  Anterior fusion from C3-C7 EXAM: CERVICAL SPINE - 2-3 VIEW COMPARISON:  Cervical spine films of 07/20/2016 and CT of the neck of 07/20/2016 FINDINGS: On the cross-table lateral view labeled 1, a needle is positioned anteriorly contracted toward the C3-4 interspace. On the second film anterior fusion plate is noted from C3 to C4 with the C4-5 area not well seen due to the patient's shoulders. IMPRESSION: Anterior fusion at C3-4 with the C4-5 level not visualized on the current portable cross-table view. Electronically Signed   By: Ivar Drape M.D.   On: 09/05/2016 15:16    Assessment/Plan: Improving, mobilize today.  Continue drain today.    LOS: 1 day    Peggyann Shoals, MD 09/06/2016, 8:24 AM

## 2016-09-06 NOTE — Evaluation (Signed)
Physical Therapy Evaluation Patient Details Name: Meghan Owen MRN: [id number] DOB: July 18, 1956 Today's Date: 09/06/2016   History of Present Illness  patient is a 77 female s/p Cervical three-four Cervical four-five  Cervical five-six Cervical six-seven  Anterior cervical decompression/diskectomy/fusion   Clinical Impression  Patient seen for mobility assessment and education s/p spinal surgery. Patient mobilizing well, performed stair negotiation and was receptive to education re: mobility and car transfers. No further acute PT needs, will sign off.    Follow Up Recommendations No PT follow up    Equipment Recommendations  None recommended by PT    Recommendations for Other Services       Precautions / Restrictions Precautions Precautions: Cervical Precaution Comments: handout provided  Required Braces or Orthoses: Cervical Brace Cervical Brace: Hard collar Restrictions Weight Bearing Restrictions: No      Mobility  Bed Mobility               General bed mobility comments: received up EOB  Transfers Overall transfer level: Needs assistance Equipment used: None Transfers: Sit to/from Stand Sit to Stand: Independent         General transfer comment: No physical assist required  Ambulation/Gait Ambulation/Gait assistance: Independent Ambulation Distance (Feet): 310 Feet Assistive device: None Gait Pattern/deviations: WFL(Within Functional Limits)     General Gait Details: steady with mobility  Stairs Stairs: Yes Stairs assistance: Supervision Stair Management: One rail Right Number of Stairs: 4 General stair comments: VCs for blind negotation technique  Wheelchair Mobility    Modified Rankin (Stroke Patients Only)       Balance Overall balance assessment: Modified Independent                                           Pertinent Vitals/Pain Pain Assessment: 0-10 Pain Score: 3  Pain Location: neck. surgical site Pain  Descriptors / Indicators: Sore Pain Intervention(s): Monitored during session    Home Living Family/patient expects to be discharged to:: Private residence Living Arrangements: Spouse/significant other Available Help at Discharge: Family Type of Home: House Home Access: Stairs to enter Entrance Stairs-Rails: Can reach both Entrance Stairs-Number of Steps: 4 Home Layout: One level Home Equipment: None      Prior Function Level of Independence: Independent               Hand Dominance   Dominant Hand: Right    Extremity/Trunk Assessment   Upper Extremity Assessment: Overall WFL for tasks assessed           Lower Extremity Assessment: Overall WFL for tasks assessed      Cervical / Trunk Assessment:  (s/p cervical surgery)  Communication   Communication: No difficulties  Cognition Arousal/Alertness: Awake/alert Behavior During Therapy: WFL for tasks assessed/performed Overall Cognitive Status: Within Functional Limits for tasks assessed                      General Comments      Exercises     Assessment/Plan    PT Assessment Patent does not need any further PT services  PT Problem List            PT Treatment Interventions      PT Goals (Current goals can be found in the Care Plan section)  Acute Rehab PT Goals PT Goal Formulation: All assessment and education complete, DC therapy    Frequency  Barriers to discharge        Co-evaluation               End of Session Equipment Utilized During Treatment: Gait belt;Cervical collar Activity Tolerance: Patient tolerated treatment well Patient left: in bed;with call bell/phone within reach;with family/visitor present (sitting EOB) Nurse Communication: Mobility status         Time: [id number] PT Time Calculation (min) (ACUTE ONLY): 10 min   Charges:   PT Evaluation $PT Eval Low Complexity: 1 Procedure     PT G CodesDuncan Dull 09-15-2016, 9:44 AM Alben Deeds, PT DPT  [phone]

## 2016-09-06 NOTE — Evaluation (Signed)
Occupational Therapy Evaluation Patient Details Name: Meghan Owen MRN: [id number] DOB: Apr 14, 1956 Today's Date: 09/06/2016    History of Present Illness patient is a 89 female s/p Cervical three-four Cervical four-five  Cervical five-six Cervical six-seven  Anterior cervical decompression/diskectomy/fusion    Clinical Impression   Patient evaluated by Occupational Therapy with no further acute OT needs identified. All education has been completed and the patient has no further questions. See below for any follow-up Occupational Therapy or equipment needs. OT to sign off. Thank you for referral.   Provided handout and reviewed cervical precautions with adls. Pt able to don doff aspen independent.      Follow Up Recommendations  No OT follow up    Equipment Recommendations  None recommended by OT    Recommendations for Other Services       Precautions / Restrictions Precautions Precautions: Cervical Precaution Comments: handout provided  Required Braces or Orthoses: Cervical Brace Cervical Brace: Hard collar Restrictions Weight Bearing Restrictions: No      Mobility Bed Mobility Overal bed mobility: Modified Independent                Transfers Overall transfer level: Independent                    Balance                                            ADL Overall ADL's : Needs assistance/impaired Eating/Feeding: Independent   Grooming: Wash/dry hands;Wash/dry face;Supervision/safety;Standing   Upper Body Bathing: Supervision/ safety   Lower Body Bathing: Minimal assistance Lower Body Bathing Details (indicate cue type and reason): spouse will (A) Upper Body Dressing : Supervision/safety Upper Body Dressing Details (indicate cue type and reason): able to don doff ccollar     Toilet Transfer: Supervision/safety       Tub/ Shower Transfer: Tub transfer;Supervision/safety Tub/Shower Transfer Details (indicate cue type and  reason): able to simulate stepping over trash can . spouse present for education   General ADL Comments: education on cervical precautions and home setup at home      Vision     Perception     Praxis      Pertinent Vitals/Pain Pain Assessment: 0-10 Pain Score: 3  Pain Location: neck Pain Descriptors / Indicators: Operative site guarding Pain Intervention(s): Limited activity within patient's tolerance;Monitored during session;Premedicated before session;Repositioned     Hand Dominance Right   Extremity/Trunk Assessment Upper Extremity Assessment Upper Extremity Assessment: Overall WFL for tasks assessed   Lower Extremity Assessment Lower Extremity Assessment: Defer to PT evaluation   Cervical / Trunk Assessment Cervical / Trunk Assessment: Other exceptions Cervical / Trunk Exceptions: s/p surg   Communication Communication Communication: No difficulties   Cognition Arousal/Alertness: Awake/alert Behavior During Therapy: WFL for tasks assessed/performed Overall Cognitive Status: Within Functional Limits for tasks assessed                     General Comments       Exercises       Shoulder Instructions      Home Living Family/patient expects to be discharged to:: Private residence Living Arrangements: Spouse/significant other Available Help at Discharge: Family Type of Home: House Home Access: Stairs to enter Technical brewer of Steps: 4 Entrance Stairs-Rails: Can reach both Home Layout: One level     Bathroom Shower/Tub:  Tub/shower unit   Bathroom Toilet: Handicapped height     Home Equipment: None          Prior Functioning/Environment Level of Independence: Independent                 OT Problem List:     OT Treatment/Interventions:      OT Goals(Current goals can be found in the care plan section)    OT Frequency:     Barriers to D/C:            Co-evaluation              End of Session Equipment  Utilized During Treatment: Gait belt;Cervical collar Nurse Communication: Mobility status;Precautions  Activity Tolerance: Patient tolerated treatment well Patient left: in bed;with call bell/phone within reach;with family/visitor present   Time: 0901-0918 OT Time Calculation (min): 17 min Charges:  OT General Charges $OT Visit: 1 Procedure OT Evaluation $OT Eval Moderate Complexity: 1 Procedure G-Codes:    Peri Maris September 19, 2016, 1:42 PM    Jeri Modena   OTR/L Pager: [phone] Office: [phone] .

## 2016-09-07 LAB — BASIC METABOLIC PANEL
Anion gap: 4 — ABNORMAL LOW (ref 5–15)
BUN: 10 mg/dL (ref 6–20)
CO2: 27 mmol/L (ref 22–32)
Calcium: 9.5 mg/dL (ref 8.9–10.3)
Chloride: 104 mmol/L (ref 101–111)
Creatinine, Ser: 0.95 mg/dL (ref 0.44–1.00)
GFR calc Af Amer: >60 mL/min (ref >60)
GFR calc non Af Amer: >60 mL/min (ref >60)
Glucose, Bld: 99 mg/dL (ref 65–99)
Potassium: 4.1 mmol/L (ref 3.5–5.1)
Sodium: 135 mmol/L (ref 135–145)

## 2016-09-07 NOTE — Progress Notes (Signed)
Patient alert and oriented, mae's well, voiding adequate amount of urine, swallowing without difficulty, c/o moderate pain and meds given prior to discharged for ride and discomfort. Patient discharged home with family. Script and discharged instructions given to patient. Patient and family stated understanding of instructions given.   

## 2016-09-07 NOTE — Discharge Summary (Signed)
Physician Discharge Summary  Patient ID: Meghan Owen MRN: [id number] DOB/AGE: 08-27-1956 60 y.o.  Admit date: 09/05/2016 Discharge date: 09/07/2016  Admission Diagnoses: Cervical myelopathy, stenosis, cord compression, radiculopathy, cervicalgia C 34, C 45, C 56, C 67 levels   Discharge Diagnoses: Cervical myelopathy, stenosis, cord compression, radiculopathy, cervicalgia C 34, C 45, C 56, C 67 levels s/p Cervical three-four Cervical four-five  Cervical five-six Cervical six-seven  Anterior cervical decompression/diskectomy/fusion (N/A) with PEEK cages, autograft, DBM, plate    Active Problems:   Cervical myelopathy [facility])   Discharged Condition: good  Hospital Course: [name] was admitted for surgery with dx cervical myelopathy, stenosis, and cord compression.  Following uncomplicated ACDF [id number], she recovered nicely and transferred to [facility] for nursing care and therapies. She has mobilized well.   Consults: None  Significant Diagnostic Studies: radiology: X-Ray: intra-op  Treatments: surgery: Cervical three-four Cervical four-five  Cervical five-six Cervical six-seven  Anterior cervical decompression/diskectomy/fusion (N/A) with PEEK cages, autograft, DBM, plate     Discharge Exam: Blood pressure 112/73, pulse 73, temperature 98.7 F (37.1 C), temperature source Oral, resp. rate 20, height 5\' 5"  (1.651 m), weight 108.9 kg (240 lb), SpO2 94 %. Alert, conversant. Reports no pain at present. Good strength BUE and hand intrinsics. Incision without erythema, swelling, or drainage. JP patent with minimal drainage overnight. No nausea this morning.    Disposition: 01-Home or Self Care  She verbalizes understanding of d/c instructions. F/u appt scheduled with DrStern. Rx's to chart for Percocet 5/325 1-2 po q4-6hrs prn pain & Flexeril 5mg  1 po up to TID prn spasm.        Medication List    TAKE these medications   b complex vitamins tablet Take 1 tablet by mouth daily.    cyclobenzaprine 5 MG tablet Commonly known as:  FLEXERIL Take 1 tablet (5 mg total) by mouth 2 (two) times daily as needed for muscle spasms.   cyclobenzaprine 10 MG tablet Commonly known as:  FLEXERIL Take 10 mg by mouth daily as needed for muscle pain.   IRON PO Take 800 mg by mouth daily.   MAGNESIUM PO Take 500 mg by mouth daily.   multivitamin with minerals Tabs tablet Take 1 tablet by mouth daily. [employer] OTC   NAC 500 MG Caps Generic drug:  Acetylcysteine Take 500 mg by mouth daily.   OVER THE COUNTER MEDICATION Take 3 g by mouth 2 (two) times daily. L- Glutamine   OVER THE COUNTER MEDICATION Take 2 tablets by mouth daily. "Seleno Methionine" thyroid supplement   OVER THE COUNTER MEDICATION Take 1 tablet by mouth 3 (three) times daily. Betaine Hcl Pepsin 571 MG   OVER THE COUNTER MEDICATION Take 1 tablet by mouth daily. Benfotiame and Alpha-Lipolic Acid   OVER THE COUNTER MEDICATION Take 2 tablets by mouth daily. Adrenal Fatigue Fighter   PROBIOTIC PO Take 1 tablet by mouth daily.   PUMPKIN SEED OIL PO Take 1 tablet by mouth daily.   TURMERIC PO Take 1,500 mg by mouth 3 (three) times daily.   Vitamin D3 5000 units Caps Take 5,000 Units by mouth daily.   VITAMIN E PO Take 1 tablet by mouth daily.        Signed: Peggyann Shoals, MD 09/07/2016, 7:50 AM

## 2016-09-07 NOTE — Progress Notes (Addendum)
Subjective: Patient reports "I'm doing good"   Objective: Vital signs in last 24 hours: Temp:  [98.5 F (36.9 C)-99.6 F (37.6 C)] 98.7 F (37.1 C) (09/28 0353) Pulse Rate:  [70-87] 73 (09/28 0353) Resp:  [18-20] 20 (09/28 0353) BP: (100-126)/(52-78) 112/73 (09/28 0353) SpO2:  [94 %-98 %] 94 % (09/28 0353)  Intake/Output from previous day: 09/27 0701 - 09/28 0700 In: 940 [P.O.:840; I.V.:100] Out: 620 [Urine:600; Drains:20] Intake/Output this shift: No intake/output data recorded.  Alert, conversant. Reports no pain at present. Good strength BUE and hand intrinsics. Incision without erythema, swelling, or drainage. JP patent with minimal drainage overnight. No nausea this morning.   Lab Results: No results for input(s): WBC, HGB, HCT, PLT in the last 72 hours. BMET  Recent Labs  09/07/16 0440  NA 135  K 4.1  CL 104  CO2 27  GLUCOSE 99  BUN 10  CREATININE 0.95  CALCIUM 9.5    Studies/Results: Dg Cervical Spine 2-3 Views  Result Date: 09/05/2016 CLINICAL DATA:  Anterior fusion from C3-C7 EXAM: CERVICAL SPINE - 2-3 VIEW COMPARISON:  Cervical spine films of 07/20/2016 and CT of the neck of 07/20/2016 FINDINGS: On the cross-table lateral view labeled 1, a needle is positioned anteriorly contracted toward the C3-4 interspace. On the second film anterior fusion plate is noted from C3 to C4 with the C4-5 area not well seen due to the patient's shoulders. IMPRESSION: Anterior fusion at C3-4 with the C4-5 level not visualized on the current portable cross-table view. Electronically Signed   By: Ivar Drape M.D.   On: 09/05/2016 15:16    Assessment/Plan: improving  LOS: 2 days  Per DrStern, D/C JP, D/C IV, D/C to home. JP pulled & DSD to site. Pt tolerated without complaint. She verbalizes understanding of d/c instructions. F/u appt scheduled with DrStern.    Verdis Prime 09/07/2016, 7:22 AM   Patient is doing well.  Discharge home.

## 2016-09-07 NOTE — Discharge Instructions (Signed)

## 2016-10-13 ENCOUNTER — Other Ambulatory Visit: Payer: Self-pay | Admitting: Internal Medicine

## 2016-10-13 DIAGNOSIS — E063 Autoimmune thyroiditis: Secondary | ICD-10-CM

## 2016-10-13 DIAGNOSIS — E042 Nontoxic multinodular goiter: Secondary | ICD-10-CM

## 2016-10-13 DIAGNOSIS — E210 Primary hyperparathyroidism: Secondary | ICD-10-CM

## 2016-10-19 ENCOUNTER — Ambulatory Visit
Admission: RE | Admit: 2016-10-19 | Discharge: 2016-10-19 | Disposition: A | Discharge status: Home / Self Care | Payer: 59 | Source: Ambulatory Visit | Attending: Internal Medicine | Admitting: Internal Medicine

## 2016-10-19 DIAGNOSIS — E210 Primary hyperparathyroidism: Secondary | ICD-10-CM

## 2016-10-19 DIAGNOSIS — E042 Nontoxic multinodular goiter: Secondary | ICD-10-CM

## 2016-10-19 DIAGNOSIS — E063 Autoimmune thyroiditis: Secondary | ICD-10-CM

## 2017-01-18 DIAGNOSIS — S63522A Sprain of radiocarpal joint of left wrist, initial encounter: Secondary | ICD-10-CM | POA: Diagnosis not present

## 2017-01-24 DIAGNOSIS — M6281 Muscle weakness (generalized): Secondary | ICD-10-CM | POA: Diagnosis not present

## 2017-01-24 DIAGNOSIS — M79604 Pain in right leg: Secondary | ICD-10-CM | POA: Diagnosis not present

## 2017-01-24 DIAGNOSIS — M25561 Pain in right knee: Secondary | ICD-10-CM | POA: Diagnosis not present

## 2017-02-01 DIAGNOSIS — M6281 Muscle weakness (generalized): Secondary | ICD-10-CM | POA: Diagnosis not present

## 2017-02-01 DIAGNOSIS — M79604 Pain in right leg: Secondary | ICD-10-CM | POA: Diagnosis not present

## 2017-02-01 DIAGNOSIS — M25561 Pain in right knee: Secondary | ICD-10-CM | POA: Diagnosis not present

## 2017-02-06 DIAGNOSIS — R200 Anesthesia of skin: Secondary | ICD-10-CM | POA: Diagnosis not present

## 2017-02-06 DIAGNOSIS — R202 Paresthesia of skin: Secondary | ICD-10-CM | POA: Diagnosis not present

## 2017-02-08 DIAGNOSIS — M6281 Muscle weakness (generalized): Secondary | ICD-10-CM | POA: Diagnosis not present

## 2017-02-08 DIAGNOSIS — M79604 Pain in right leg: Secondary | ICD-10-CM | POA: Diagnosis not present

## 2017-02-08 DIAGNOSIS — M25561 Pain in right knee: Secondary | ICD-10-CM | POA: Diagnosis not present

## 2017-02-19 DIAGNOSIS — R202 Paresthesia of skin: Secondary | ICD-10-CM | POA: Diagnosis not present

## 2017-02-19 DIAGNOSIS — S63522D Sprain of radiocarpal joint of left wrist, subsequent encounter: Secondary | ICD-10-CM | POA: Diagnosis not present

## 2017-02-19 DIAGNOSIS — R200 Anesthesia of skin: Secondary | ICD-10-CM | POA: Diagnosis not present

## 2017-02-22 DIAGNOSIS — M79604 Pain in right leg: Secondary | ICD-10-CM | POA: Diagnosis not present

## 2017-02-22 DIAGNOSIS — M6281 Muscle weakness (generalized): Secondary | ICD-10-CM | POA: Diagnosis not present

## 2017-02-22 DIAGNOSIS — M25561 Pain in right knee: Secondary | ICD-10-CM | POA: Diagnosis not present

## 2017-02-26 DIAGNOSIS — R200 Anesthesia of skin: Secondary | ICD-10-CM | POA: Diagnosis not present

## 2017-02-26 DIAGNOSIS — R202 Paresthesia of skin: Secondary | ICD-10-CM | POA: Diagnosis not present

## 2017-03-10 DIAGNOSIS — R200 Anesthesia of skin: Secondary | ICD-10-CM | POA: Diagnosis not present

## 2017-03-10 DIAGNOSIS — R202 Paresthesia of skin: Secondary | ICD-10-CM | POA: Diagnosis not present

## 2017-03-12 DIAGNOSIS — M5412 Radiculopathy, cervical region: Secondary | ICD-10-CM | POA: Diagnosis not present

## 2017-03-12 DIAGNOSIS — M542 Cervicalgia: Secondary | ICD-10-CM | POA: Diagnosis not present

## 2017-03-12 DIAGNOSIS — M4802 Spinal stenosis, cervical region: Secondary | ICD-10-CM | POA: Diagnosis not present

## 2017-03-23 DIAGNOSIS — R200 Anesthesia of skin: Secondary | ICD-10-CM | POA: Diagnosis not present

## 2017-03-23 DIAGNOSIS — R202 Paresthesia of skin: Secondary | ICD-10-CM | POA: Diagnosis not present

## 2017-04-10 HISTORY — PX: ELBOW SURGERY: SHX618

## 2017-04-16 DIAGNOSIS — M170 Bilateral primary osteoarthritis of knee: Secondary | ICD-10-CM | POA: Diagnosis not present

## 2017-04-16 DIAGNOSIS — R768 Other specified abnormal immunological findings in serum: Secondary | ICD-10-CM | POA: Diagnosis not present

## 2017-04-16 DIAGNOSIS — Z8639 Personal history of other endocrine, nutritional and metabolic disease: Secondary | ICD-10-CM | POA: Diagnosis not present

## 2017-04-30 DIAGNOSIS — M67422 Ganglion, left elbow: Secondary | ICD-10-CM | POA: Diagnosis not present

## 2017-04-30 DIAGNOSIS — R2232 Localized swelling, mass and lump, left upper limb: Secondary | ICD-10-CM | POA: Diagnosis not present

## 2017-04-30 DIAGNOSIS — M60222 Foreign body granuloma of soft tissue, not elsewhere classified, left upper arm: Secondary | ICD-10-CM | POA: Diagnosis not present

## 2017-04-30 DIAGNOSIS — G5632 Lesion of radial nerve, left upper limb: Secondary | ICD-10-CM | POA: Diagnosis not present

## 2017-05-11 DIAGNOSIS — G5632 Lesion of radial nerve, left upper limb: Secondary | ICD-10-CM | POA: Diagnosis not present

## 2017-05-28 DIAGNOSIS — G5632 Lesion of radial nerve, left upper limb: Secondary | ICD-10-CM | POA: Diagnosis not present

## 2017-06-06 DIAGNOSIS — E063 Autoimmune thyroiditis: Secondary | ICD-10-CM | POA: Diagnosis not present

## 2017-06-06 DIAGNOSIS — E042 Nontoxic multinodular goiter: Secondary | ICD-10-CM | POA: Diagnosis not present

## 2017-06-06 DIAGNOSIS — E210 Primary hyperparathyroidism: Secondary | ICD-10-CM | POA: Diagnosis not present

## 2017-06-06 DIAGNOSIS — G5632 Lesion of radial nerve, left upper limb: Secondary | ICD-10-CM | POA: Diagnosis not present

## 2017-09-24 DIAGNOSIS — D2371 Other benign neoplasm of skin of right lower limb, including hip: Secondary | ICD-10-CM | POA: Diagnosis not present

## 2017-09-24 DIAGNOSIS — M2021 Hallux rigidus, right foot: Secondary | ICD-10-CM | POA: Diagnosis not present

## 2017-10-05 ENCOUNTER — Encounter (INDEPENDENT_AMBULATORY_CARE_PROVIDER_SITE_OTHER): Payer: Self-pay | Admitting: Orthopedic Surgery

## 2017-10-05 ENCOUNTER — Ambulatory Visit (INDEPENDENT_AMBULATORY_CARE_PROVIDER_SITE_OTHER): Payer: 59

## 2017-10-05 ENCOUNTER — Ambulatory Visit (INDEPENDENT_AMBULATORY_CARE_PROVIDER_SITE_OTHER): Payer: 59 | Admitting: Orthopedic Surgery

## 2017-10-05 DIAGNOSIS — M25512 Pain in left shoulder: Secondary | ICD-10-CM

## 2017-10-05 MED ORDER — DICLOFENAC EPOLAMINE 1.3 % TD PTCH: MEDICATED_PATCH | TRANSDERMAL | 1 refills | Status: DC

## 2017-10-05 NOTE — Progress Notes (Signed)
Office Visit Note   Patient: Meghan Owen           Date of Birth: 1955/12/31           MRN: [id number] Visit Date: 10/05/2017 Requested by: Harlan Stains, MD [place], [place] [address] PCP: Harlan Stains, MD  Subjective: Chief Complaint  Patient presents with  . Left Shoulder - Pain    HPI: Meghan Owen is a 61 year old patient with left shoulder pain of 3 weeks duration.  She reports pain popping and waking from sleep at night with the pain.  She is right-hand dominant.  She has been moving a lot of heavy objects and exercising since March.  She heard a click in the shoulder is been having some pain since that time.  Not taking any medication for the problem.  She does have a history of neck surgery last year.  Hard for her to sleep on that side.  She has had an MRI scan done in 2014 which by report showed partial-thickness rotator cuff tearing.  She had physical therapy and Flector patch placed at that time which helped.              ROS: All systems reviewed are negative as they relate to the chief complaint within the history of present illness.  Patient denies  fevers or chills.   Assessment & Plan: Visit Diagnoses:  1. Acute pain of left shoulder     Plan: Impression is left shoulder pain possible bursitis versus rotator cuff pathology with reasonable strength at this time.  Plan is physical therapy and Flector patch to help with the shoulder.  We will see how that works for her and check her back in 3 months.  May need to consider further imaging at that time.  Follow-Up Instructions: Return in about 3 months (around 01/05/2018).   Orders:  Orders Placed This Encounter  Procedures  . XR Shoulder Left   Meds ordered this encounter  Medications  . diclofenac (FLECTOR) 1.3 % PTCH    Sig: Apply patch to left shoulder as needed on for 12 hours and off for 12 hours. Repeat as needed    Dispense:  30 patch    Refill:  1      Procedures: No  procedures performed   Clinical Data: No additional findings.  Objective: Vital Signs: There were no vitals taken for this visit.  Physical Exam:   Constitutional: Patient appears well-developed HEENT:  Head: Normocephalic Eyes:EOM are normal Neck: Normal range of motion Cardiovascular: Normal rate Pulmonary/chest: Effort normal Neurologic: Patient is alert Skin: Skin is warm Psychiatric: Patient has normal mood and affect    Ortho Exam: Rotation and flexion is predictably limited due to her multiple level fusion done last year.  Motor sensory function to the left and right arms intact.  Radial pulse intact.  Rotator cuff strength is good to isolated infraspinatus supraspinatus and subscap muscle testing with no real coarse grinding or crepitus noted.  No discrete acromioclavicular joint tenderness is noted on the left or right hand side.  Impingement signs positive on the left negative on the right.  No other masses lymphadenopathy or skin changes noted in the shoulder girdle region hopedic exam demonstrates pretty good cervical spine range of motion although  Specialty Comments:  No specialty comments available.  Imaging: Xr Shoulder Left  Result Date: 10/05/2017 AP lateral outlet left shoulder reviewed.  Shoulder is located.  No fracture present.  Mild acromioclavicular  joint degenerative changes noted.  Visualized lung fields clear.  No significant glenohumeral arthritis present    PMFS History: Patient Active Problem List   Diagnosis Date Noted  . Cervical myelopathy ([place]) 09/05/2016  . GERD 02/05/2009  . OTHER BURSITIS DISORDERS 02/05/2009  . ABNORMAL ELECTROCARDIOGRAM 02/05/2009   Past Medical History:  Diagnosis Date  . Arthritis   . Fibromyalgia   . GERD (gastroesophageal reflux disease)    HX  . Hashimoto's thyroiditis   . Headache   . Hypothyroidism   . Liver cyst    BENIGN  . Parathyroid disorder ([place])    HYPERPARATHYROID  . Spinal stenosis   .  Thyroid disease     Family History  Problem Relation Age of Onset  . Lupus Mother   . Cancer Other     Past Surgical History:  Procedure Laterality Date  . ABDOMINAL HYSTERECTOMY    . ANKLE SURGERY    . ANTERIOR CERVICAL DECOMPRESSION/DISCECTOMY FUSION 4 LEVELS N/A 09/05/2016   Procedure: Cervical three-four Cervical four-five  Cervical five-six Cervical six-seven  Anterior cervical decompression/diskectomy/fusion;  Surgeon: Erline Levine, MD;  Location: [place];  Service: Neurosurgery;  Laterality: N/A;  . FOOT SURGERY     Social History   Occupational History  . Not on file.   Social History Main Topics  . Smoking status: Former Research scientist (life sciences)  . Smokeless tobacco: Never Used  . Alcohol use No  . Drug use: No  . Sexual activity: Not on file

## 2017-10-18 DIAGNOSIS — M25612 Stiffness of left shoulder, not elsewhere classified: Secondary | ICD-10-CM | POA: Diagnosis not present

## 2017-10-24 ENCOUNTER — Emergency Department (HOSPITAL_COMMUNITY)
Admission: EM | Admit: 2017-10-24 | Discharge: 2017-10-24 | Disposition: A | Discharge status: Home / Self Care | Payer: 59 | Attending: Emergency Medicine | Admitting: Emergency Medicine

## 2017-10-24 ENCOUNTER — Emergency Department (HOSPITAL_COMMUNITY): Payer: 59

## 2017-10-24 ENCOUNTER — Encounter (HOSPITAL_COMMUNITY): Payer: Self-pay | Admitting: *Deleted

## 2017-10-24 DIAGNOSIS — Z87891 Personal history of nicotine dependence: Secondary | ICD-10-CM | POA: Insufficient documentation

## 2017-10-24 DIAGNOSIS — R109 Unspecified abdominal pain: Secondary | ICD-10-CM | POA: Diagnosis not present

## 2017-10-24 DIAGNOSIS — Z79899 Other long term (current) drug therapy: Secondary | ICD-10-CM | POA: Diagnosis not present

## 2017-10-24 DIAGNOSIS — R9431 Abnormal electrocardiogram [ECG] [EKG]: Secondary | ICD-10-CM | POA: Diagnosis not present

## 2017-10-24 DIAGNOSIS — R110 Nausea: Secondary | ICD-10-CM | POA: Diagnosis not present

## 2017-10-24 DIAGNOSIS — E213 Hyperparathyroidism, unspecified: Secondary | ICD-10-CM | POA: Diagnosis not present

## 2017-10-24 LAB — CBC WITH DIFFERENTIAL/PLATELET
Basophils Absolute: 0 10*3/uL (ref 0.0–0.1)
Basophils Relative: 0 %
Eosinophils Absolute: 0.1 10*3/uL (ref 0.0–0.7)
Eosinophils Relative: 1 %
HCT: 37.8 % (ref 36.0–46.0)
Hemoglobin: 12.7 g/dL (ref 12.0–15.0)
Lymphocytes Relative: 20 %
Lymphs Abs: 1.4 10*3/uL (ref 0.7–4.0)
MCH: 29.3 pg (ref 26.0–34.0)
MCHC: 33.6 g/dL (ref 30.0–36.0)
MCV: 87.3 fL (ref 78.0–100.0)
Monocytes Absolute: 0.4 10*3/uL (ref 0.1–1.0)
Monocytes Relative: 6 %
Neutro Abs: 5.3 10*3/uL (ref 1.7–7.7)
Neutrophils Relative %: 73 %
Platelets: 188 10*3/uL (ref 150–400)
RBC: 4.33 MIL/uL (ref 3.87–5.11)
RDW: 14 % (ref 11.5–15.5)
WBC: 7.2 10*3/uL (ref 4.0–10.5)

## 2017-10-24 LAB — COMPREHENSIVE METABOLIC PANEL
ALT: 21 U/L (ref 14–54)
AST: 24 U/L (ref 15–41)
Albumin: 3.9 g/dL (ref 3.5–5.0)
Alkaline Phosphatase: 118 U/L (ref 38–126)
Anion gap: 8 (ref 5–15)
BUN: 9 mg/dL (ref 6–20)
CO2: 22 mmol/L (ref 22–32)
Calcium: 9.8 mg/dL (ref 8.9–10.3)
Chloride: 106 mmol/L (ref 101–111)
Creatinine, Ser: 0.86 mg/dL (ref 0.44–1.00)
GFR calc Af Amer: >60 mL/min (ref >60)
GFR calc non Af Amer: >60 mL/min (ref >60)
Glucose, Bld: 87 mg/dL (ref 65–99)
Potassium: 3.8 mmol/L (ref 3.5–5.1)
Sodium: 136 mmol/L (ref 135–145)
Total Bilirubin: 0.7 mg/dL (ref 0.3–1.2)
Total Protein: 8.2 g/dL — ABNORMAL HIGH (ref 6.5–8.1)

## 2017-10-24 LAB — URINALYSIS, ROUTINE W REFLEX MICROSCOPIC
Bilirubin Urine: NEGATIVE
Glucose, UA: NEGATIVE mg/dL
Hgb urine dipstick: NEGATIVE
Ketones, ur: NEGATIVE mg/dL
Leukocytes, UA: NEGATIVE
Nitrite: NEGATIVE
Protein, ur: NEGATIVE mg/dL
Specific Gravity, Urine: 1.005 (ref 1.005–1.030)
pH: 7 (ref 5.0–8.0)

## 2017-10-24 MED ORDER — ONDANSETRON 4 MG PO TBDP: 4.0000 mg | ORAL_TABLET | Freq: Three times a day (TID) | ORAL | 0 refills | Status: DC | PRN

## 2017-10-24 MED ORDER — ONDANSETRON HCL 4 MG/2ML IJ SOLN
4.0000 mg | Freq: Once | INTRAMUSCULAR | Status: AC
  Administered 2017-10-24: 4 mg via INTRAVENOUS
  Filled 2017-10-24: qty 2

## 2017-10-24 MED ORDER — SODIUM CHLORIDE 0.9 % IV BOLUS (SEPSIS)
1000.0000 mL | Freq: Once | INTRAVENOUS | Status: AC
  Administered 2017-10-24: 1000 mL via INTRAVENOUS

## 2017-10-24 NOTE — ED Triage Notes (Signed)
Pt complains of nausea, weakness for the past week. Pt states she has hypoparathyroidism. Pt states she is concerned her calcium is high.

## 2017-10-24 NOTE — ED Provider Notes (Signed)
[facility] DEPT Provider Note   CSN: [id number] Arrival date & time: 10/24/17  1142     History   Chief Complaint Chief Complaint  Patient presents with  . Nausea  . Weakness    HPI Meghan Owen is a 61 y.o. female.  Pt presents to the ED today with nausea, weakness, and altered mental status.  Pt does have a hx of hyperparathyroidism and is worried her calcium may be high.  The pt also reports dysuria and frequency.         Past Medical History:  Diagnosis Date  . Arthritis   . Fibromyalgia   . GERD (gastroesophageal reflux disease)    HX  . Hashimoto's thyroiditis   . Headache   . Hypothyroidism   . Liver cyst    BENIGN  . Parathyroid disorder ([place])    HYPERPARATHYROID  . Spinal stenosis   . Thyroid disease     Patient Active Problem List   Diagnosis Date Noted  . Cervical myelopathy ([place]) 09/05/2016  . GERD 02/05/2009  . OTHER BURSITIS DISORDERS 02/05/2009  . ABNORMAL ELECTROCARDIOGRAM 02/05/2009    Past Surgical History:  Procedure Laterality Date  . ABDOMINAL HYSTERECTOMY    . ANKLE SURGERY    . FOOT SURGERY      OB History    No data available       Home Medications    Prior to Admission medications   Medication Sig Start Date End Date Taking? Authorizing Provider  b complex vitamins tablet Take 1 tablet by mouth daily.   Yes [provider]  Cholecalciferol (VITAMIN D3) 5000 UNITS CAPS Take 5,000 Units by mouth daily.    Yes [provider]  IRON PO Take 800 mg by mouth daily.   Yes [provider]  MAGNESIUM PO Take 500 mg by mouth daily.    Yes [provider]  Multiple Vitamin (MULTIVITAMIN WITH MINERALS) TABS Take 1 tablet by mouth daily. [employer] OTC   Yes [provider]  OVER THE COUNTER MEDICATION Take 1 tablet by mouth daily. Benfotiame and Alpha-Lipolic Acid   Yes [provider]  Probiotic Product (PROBIOTIC PO) Take 1 tablet by mouth  daily.   Yes [provider]  TURMERIC PO Take 1,500 mg by mouth 3 (three) times daily.    Yes [provider]  cyclobenzaprine (FLEXERIL) 5 MG tablet Take 1 tablet (5 mg total) by mouth 2 (two) times daily as needed for muscle spasms. Patient not taking: Reported on 10/24/2017 05/12/15   [name], PA-C  diclofenac (FLECTOR) 1.3 % PTCH Apply patch to left shoulder as needed on for 12 hours and off for 12 hours. Repeat as needed Patient not taking: Reported on 10/24/2017 10/05/17   Meredith Pel, MD  ondansetron (ZOFRAN ODT) 4 MG disintegrating tablet Take 1 tablet (4 mg total) every 8 (eight) hours as needed by mouth. 10/24/17   Isla Pence, MD    Family History Family History  Problem Relation Age of Onset  . Lupus Mother   . Cancer Other     Social History Social History   Tobacco Use  . Smoking status: Former Research scientist (life sciences)  . Smokeless tobacco: Never Used  Substance Use Topics  . Alcohol use: No  . Drug use: No     Allergies   Aspirin; Contrast media [iodinated diagnostic agents]; Iodine-131; Ioxaglate; Penicillins; and Prednisone   Review of Systems Review of Systems  Constitutional: Positive for fatigue.  Gastrointestinal:  Positive for nausea.  All other systems reviewed and are negative.    Physical Exam Updated Vital Signs BP 115/67   Pulse 74   Temp 98 F (36.7 C) (Oral)   Resp 15   SpO2 98%   Physical Exam  Constitutional: She is oriented to person, place, and time. She appears well-developed and well-nourished.  HENT:  Head: Normocephalic and atraumatic.  Right Ear: External ear normal.  Left Ear: External ear normal.  Nose: Nose normal.  Mouth/Throat: Oropharynx is clear and moist.  Eyes: Conjunctivae and EOM are normal. Pupils are equal, round, and reactive to light.  Neck: Normal range of motion. Neck supple.  Cardiovascular: Normal rate, regular rhythm, normal heart sounds and intact distal pulses.  Pulmonary/Chest:  Effort normal and breath sounds normal.  Abdominal: Soft. Bowel sounds are normal.  Musculoskeletal: Normal range of motion.  Neurological: She is alert and oriented to person, place, and time.  Skin: Skin is warm. Capillary refill takes less than 2 seconds.  Psychiatric: She has a normal mood and affect. Her behavior is normal. Judgment and thought content normal.  Nursing note and vitals reviewed.    ED Treatments / Results  Labs (all labs ordered are listed, but only abnormal results are displayed) Labs Reviewed  COMPREHENSIVE METABOLIC PANEL - Abnormal; Notable for the following components:      Result Value   Total Protein 8.2 (*)    All other components within normal limits  URINALYSIS, ROUTINE W REFLEX MICROSCOPIC - Abnormal; Notable for the following components:   Color, Urine STRAW (*)    All other components within normal limits  CBC WITH DIFFERENTIAL/PLATELET    EKG  EKG Interpretation None       Radiology Ct Abdomen Pelvis Wo Contrast  Result Date: 10/24/2017 CLINICAL DATA:  61 year old female with abdominal pain greater on the right for 2 weeks. Nausea and weakness/fatigue. Parathyroid disease, hyperparathyroidism. EXAM: CT ABDOMEN AND PELVIS WITHOUT CONTRAST TECHNIQUE: Multidetector CT imaging of the abdomen and pelvis was performed following the standard protocol without IV contrast. COMPARISON:  Abdomen MRI 06/10/2016 FINDINGS: Lower chest: Mild bilateral lung base atelectasis and scarring. No pericardial or pleural effusion. Hepatobiliary: Negative noncontrast liver and gallbladder. Pancreas: Negative. Spleen: Negative. Adrenals/Urinary Tract: Normal left adrenal gland. Stable 3.2 cm fat containing right adrenal adenoma (benign). Both kidneys appear normal, and stable to the comparison MRI. No nephrolithiasis or perinephric stranding. Superimposed bilateral gonadal vein phleboliths. No proximal hydroureter or periureteral stranding. Likewise the distal ureters  appear normal to the ureterovesical ureterovesical junctions. Unremarkable urinary bladder.  Multiple pelvic phleboliths. Stomach/Bowel: Decompressed rectum and sigmoid colon. Decompressed left colon and splenic flexure. Mildly redundant transverse colon with retained stool. Similar retained stool in the right colon. The cecum is on a lax mesentery located near midline in the lower abdomen. Normal retrocecal appendix (Series 2, image 52). Negative terminal ileum. No dilated small bowel. Decompressed stomach and duodenum. No abdominal free fluid. Vascular/Lymphatic: Vascular patency is not evaluated in the absence of IV contrast. No lymphadenopathy. Reproductive: Surgically absent uterus. Ovaries are within normal limits. Other: No pelvic free fluid. Musculoskeletal: lower lumbar facet degeneration. Degenerative appearing spurring and ankylosis of the superior right SI joint. No lytic or destructive osseous lesions. No acute osseous abnormality identified. IMPRESSION: 1. Normal appendix.  No nephrolithiasis or obstructive uropathy. 2. No acute or inflammatory findings in the noncontrast abdomen or pelvis. 3. Benign adrenal adenoma. Electronically Signed   By: Genevie Ann M.D.   On: 10/24/2017 14:59  Procedures Procedures (including critical care time)  Medications Ordered in ED Medications  sodium chloride 0.9 % bolus 1,000 mL (0 mLs Intravenous Stopped 10/24/17 1524)  ondansetron (ZOFRAN) injection 4 mg (4 mg Intravenous Given 10/24/17 1230)     Initial Impression / Assessment and Plan / ED Course  I have reviewed the triage vital signs and the nursing notes.  Pertinent labs & imaging results that were available during my care of the patient were reviewed by me and considered in my medical decision making (see chart for details).    Pt is feeling much better.  She will be d/c with instructions to return if worse and to f/u with pcp.  Final Clinical Impressions(s) / ED Diagnoses   Final  diagnoses:  Nausea    ED Discharge Orders        Ordered    ondansetron (ZOFRAN ODT) 4 MG disintegrating tablet  Every 8 hours PRN     10/24/17 1522       Isla Pence, MD 10/24/17 1527

## 2017-10-24 NOTE — ED Notes (Signed)
ED Provider at bedside. 

## 2017-10-24 NOTE — ED Notes (Signed)
Patient transported to CT 

## 2017-10-26 DIAGNOSIS — G5632 Lesion of radial nerve, left upper limb: Secondary | ICD-10-CM | POA: Diagnosis not present

## 2017-11-14 ENCOUNTER — Encounter (INDEPENDENT_AMBULATORY_CARE_PROVIDER_SITE_OTHER): Payer: Self-pay | Admitting: Orthopedic Surgery

## 2017-11-14 ENCOUNTER — Ambulatory Visit (INDEPENDENT_AMBULATORY_CARE_PROVIDER_SITE_OTHER): Payer: 59 | Admitting: Orthopedic Surgery

## 2017-11-14 DIAGNOSIS — M545 Low back pain, unspecified: Secondary | ICD-10-CM

## 2017-11-14 DIAGNOSIS — Z8261 Family history of arthritis: Secondary | ICD-10-CM

## 2017-11-14 DIAGNOSIS — G8929 Other chronic pain: Secondary | ICD-10-CM

## 2017-11-14 NOTE — Progress Notes (Signed)
Office Visit Note   Patient: Meghan Owen           Date of Birth: June 10, 1956           MRN: [id number] Visit Date: 11/14/2017 Requested by: Harlan Stains, MD [address] [place], [place] [address] PCP: Harlan Stains, MD  Subjective: Chief Complaint  Patient presents with  . Left Hip - Pain  . Lower Back - Pain    HPI: Meghan Owen is a patient with multiple joint complaints.  She went to the emergency room on 10/24/2017 for nausea and a CT scan of the abdomen and pelvis was performed.  This showed definite arthritis and sclerosis of the upper portion of her sacroiliac joint.  Is having pain in this area along with pain in her ankles knees and shoulders.  Her mother had rheumatoid arthritis.  Over 10 years ago Meghan Owen was tested for rheumatoid arthritis but that was normal.  He was diagnosed at that time with fibromyalgia and polymyalgia rheumatica.  She states that her shoulders are becoming stiffer.  She is taking some occasional over-the-counter medication for the problem.  She denies any history of injury to her joints.              ROS: All systems reviewed are negative as they relate to the chief complaint within the history of present illness.  Patient denies  fevers or chills.   Assessment & Plan: Visit Diagnoses:  1. Family history of rheumatoid arthritis   2. Chronic right-sided low back pain without sciatica     Plan: Pression is multiple joint evidence of synovitis in both ankles.  Radiologic finding on the CT scan is reviewed with the patient and it is a concrete finding of sclerosis and partial fusion of the superior portion of that right sacroiliac joint.  This is abnormal and in light of her multiple joint complaints and familiar history of rheumatoid arthritis rheumatologic workup is indicated.  I will refer her to a rheumatologist.  Follow-up with me as needed.  Follow-Up Instructions: No Follow-up on file.   Orders:  Orders Placed This Encounter    Procedures  . Ambulatory referral to Rheumatology   No orders of the defined types were placed in this encounter.     Procedures: No procedures performed   Clinical Data: No additional findings.  Objective: Vital Signs: There were no vitals taken for this visit.  Physical Exam:  Constitutional: Patient appears well-developed HEENT:  Head: Normocephalic Eyes:EOM are normal Neck: Normal range of motion Cardiovascular: Normal rate Pulmonary/chest: Effort normal Neurologic: Patient is alert Skin: Skin is warm Psychiatric: Patient has normal mood and affect    Ortho Exam: Orthopedic exam demonstrates good rotator cuff strength bilaterally to infraspinatus supraspinatus and subscap muscle testing.  Radial pulses intact bilaterally.  I do not detect much in the way of synovitis or warmth in the wrist elbow or shoulder joints.  The patient has no effusion in either knees but does have some synovitis and warmth ankles.  Ankle range of motion is slightly restricted more than normal with about dorsiflexion and only about 20 of plantarflexion.  Patient has some tenderness to direct palpation of the sacroiliac joint.  No nerve root tension signs today.  Specialty Comments:  No specialty comments available.  Imaging: No results found.   PMFS History: Patient Active Problem List   Diagnosis Date Noted  . Cervical myelopathy ([place]) 09/05/2016  . GERD 02/05/2009  . OTHER BURSITIS DISORDERS 02/05/2009  .  ABNORMAL ELECTROCARDIOGRAM 02/05/2009   Past Medical History:  Diagnosis Date  . Arthritis   . Fibromyalgia   . GERD (gastroesophageal reflux disease)    HX  . Hashimoto's thyroiditis   . Headache   . Hypothyroidism   . Liver cyst    BENIGN  . Parathyroid disorder ([place])    HYPERPARATHYROID  . Spinal stenosis   . Thyroid disease     Family History  Problem Relation Age of Onset  . Lupus Mother   . Cancer Other     Past Surgical History:  Procedure Laterality Date   . ABDOMINAL HYSTERECTOMY    . ANKLE SURGERY    . ANTERIOR CERVICAL DECOMPRESSION/DISCECTOMY FUSION 4 LEVELS N/A 09/05/2016   Procedure: Cervical three-four Cervical four-five  Cervical five-six Cervical six-seven  Anterior cervical decompression/diskectomy/fusion;  Surgeon: Erline Levine, MD;  Location: [facility];  Service: Neurosurgery;  Laterality: N/A;  . FOOT SURGERY     Social History   Occupational History  . Not on file  Tobacco Use  . Smoking status: Former Research scientist (life sciences)  . Smokeless tobacco: Never Used  Substance and Sexual Activity  . Alcohol use: No  . Drug use: No  . Sexual activity: Not on file

## 2017-11-26 DIAGNOSIS — G5632 Lesion of radial nerve, left upper limb: Secondary | ICD-10-CM | POA: Diagnosis not present

## 2017-12-07 ENCOUNTER — Telehealth (INDEPENDENT_AMBULATORY_CARE_PROVIDER_SITE_OTHER): Payer: Self-pay | Admitting: Orthopedic Surgery

## 2017-12-07 NOTE — Telephone Encounter (Signed)
I am aware referral from Dr Estanislado Pandy denied. I had called patient to let her know. I will make referral but waiting to hear back from [facility] since patient formerly treated by Dr Estanislado Pandy.

## 2017-12-07 NOTE — Telephone Encounter (Signed)
Pt would like to know if Dr.Dean can refer pt to another Rheumatologist Dr.Deveshwar denied  her referral.  [facility] on [address] in [place]

## 2017-12-14 DIAGNOSIS — G5602 Carpal tunnel syndrome, left upper limb: Secondary | ICD-10-CM | POA: Diagnosis not present

## 2017-12-17 DIAGNOSIS — D2371 Other benign neoplasm of skin of right lower limb, including hip: Secondary | ICD-10-CM | POA: Diagnosis not present

## 2017-12-20 NOTE — Progress Notes (Signed)
Office Visit Note  Patient: Meghan Owen             Date of Birth: 1956/04/23           MRN: [id number]             PCP: Harlan Stains, MD Referring: Harlan Stains, MD Visit Date: 12/21/2017 Occupation: _0 @    Subjective:  Other (right SI joint pain )   History of Present Illness: Meghan Owen is a 62 y.o. female seen in consultation per request of Dr. [name]. She was seen by me in the past between 2006 and 2008 for disc disease and generalized pain. She states over time her symptoms have gotten worse. She's been experiencing increased fatigue. She also has difficulty getting up from the squatting position. She had been evaluated by Dr. Alver Fisher local rheumatologist who did all the lab work which was unremarkable. She also went to [facility] rheumatology where she was evaluated and was diagnosed with Hashimoto's thyroiditis. She states the pain persist and she went to see her PCP to advise some exercises. She has had known history of this disease of her C-spine and she had 4 level fusion by Dr. Vertell Limber in 2017. She continues to have some limitation and stiffness in her right shoulder for which she's been going to physical therapy. She states she was seen by Dr. [name] recently or evaluation and he found that on the CT scan of her abdomen she had some changes in her SI joint for that reason she was referred to me. Patient reports that she has had knee joint discomfort for which Dr. [name] has and x-rays in the past and told her that she had mild osteoarthritis in her knee joints.  Activities of Daily Living:  Patient reports morning stiffness for 20 minutes.   Patient Reports nocturnal pain.  Difficulty dressing/grooming: Denies Difficulty climbing stairs: Denies Difficulty getting out of chair: Reports Difficulty using hands for taps, buttons, cutlery, and/or writing: Reports   Review of Systems  Constitutional: Positive for fatigue. Negative for night sweats, weight gain, weight loss  and weakness.  HENT: Positive for mouth dryness. Negative for mouth sores, trouble swallowing, trouble swallowing and nose dryness.   Eyes: Negative for pain, redness, visual disturbance and dryness.  Respiratory: Negative for cough, shortness of breath and difficulty breathing.   Cardiovascular: Negative for chest pain, palpitations, hypertension, irregular heartbeat and swelling in legs/feet.  Gastrointestinal: Positive for diarrhea. Negative for blood in stool and constipation.  Endocrine: Negative for increased urination.  Genitourinary: Negative for vaginal dryness.  Musculoskeletal: Positive for arthralgias, joint pain and morning stiffness. Negative for joint swelling, myalgias, muscle weakness, muscle tenderness and myalgias.  Skin: Negative for color change, rash, hair loss, skin tightness, ulcers and sensitivity to sunlight.  Allergic/Immunologic: Negative for susceptible to infections.  Neurological: Negative for dizziness, memory loss and night sweats.  Hematological: Negative for swollen glands.  Psychiatric/Behavioral: Negative for depressed mood and sleep disturbance. The patient is not nervous/anxious.     PMFS History:  Patient Active Problem List   Diagnosis Date Noted  . Cervical myelopathy ([place]) 09/05/2016  . GERD 02/05/2009  . OTHER BURSITIS DISORDERS 02/05/2009  . ABNORMAL ELECTROCARDIOGRAM 02/05/2009    Past Medical History:  Diagnosis Date  . Arthritis   . Fibromyalgia   . GERD (gastroesophageal reflux disease)    HX  . Hashimoto's thyroiditis   . Headache   . Hypothyroidism   . Liver cyst    BENIGN  .  Parathyroid disorder ([place])    HYPERPARATHYROID  . Spinal stenosis   . Thyroid disease     Family History  Problem Relation Age of Onset  . Lupus Mother   . Rheum arthritis Mother   . Dementia Father   . Leukemia Father   . Cancer Other   . Cancer Sister        bone   . Healthy Son   . Healthy Son   . Healthy Daughter    Past Surgical History:   Procedure Laterality Date  . ABDOMINAL HYSTERECTOMY    . ANKLE SURGERY    . ANTERIOR CERVICAL DECOMPRESSION/DISCECTOMY FUSION 4 LEVELS N/A 09/05/2016   Procedure: Cervical three-four Cervical four-five  Cervical five-six Cervical six-seven  Anterior cervical decompression/diskectomy/fusion;  Surgeon: Erline Levine, MD;  Location: [facility];  Service: Neurosurgery;  Laterality: N/A;  . ELBOW SURGERY  04/2017   cyst removed   . FOOT SURGERY     Social History   Social History Narrative  . Not on file     Objective: Vital Signs: BP 120/62 (BP Location: Left Arm, Patient Position: Sitting, Cuff Size: Normal)   Pulse 74   Resp 16   Ht 5' 5" (1.651 m)   Wt 248 lb (112.5 kg)   BMI 41.27 kg/m    Physical Exam  Constitutional: She is oriented to person, place, and time. She appears well-developed and well-nourished.  HENT:  Head: Normocephalic and atraumatic.  Eyes: Conjunctivae and EOM are normal.  Neck: Normal range of motion.  Cardiovascular: Normal rate, regular rhythm, normal heart sounds and intact distal pulses.  Pulmonary/Chest: Effort normal and breath sounds normal.  Abdominal: Soft. Bowel sounds are normal.  Lymphadenopathy:    She has no cervical adenopathy.  Neurological: She is alert and oriented to person, place, and time.  Skin: Skin is warm and dry. Capillary refill takes less than 2 seconds.  Psychiatric: She has a normal mood and affect. Her behavior is normal.  Nursing note and vitals reviewed.    Musculoskeletal Exam: C-spine limited range of motion. Thoracic spine some discomfort range of motion. Lumbar spine limited range of motion. She has tenderness on palpation over bilateral SI joints more so on the right than the left. Shoulder joints were good range of motion with some discomfort in her right shoulder. Elbow joints wrist joint MCPs PIPs DIPs with good range of motion with no synovitis. She had good range of motion of her right hip joints some  limitation discomfort range of motion of her left hip joint. She has discomfort range of motion of her right knee joint. She is some postsurgical changes on her right foot but no warmth swelling or effusion was noted.   CDAI Exam: No CDAI exam completed.    Investigation: No additional findings. CBC Latest Ref Rng & Units 10/24/2017 08/29/2016 11/26/2014  WBC 4.0 - 10.5 K/uL 7.2 8.0 7.6  Hemoglobin 12.0 - 15.0 g/dL 12.7 13.0 12.8  Hematocrit 36.0 - 46.0 % 37.8 40.4 38.6  Platelets 150 - 400 K/uL 188 243 199   CMP Latest Ref Rng & Units 10/24/2017 09/07/2016 08/29/2016  Glucose 65 - 99 mg/dL 87 99 96  BUN 6 - 20 mg/dL _0 Creatinine 0.44 - 1.00 mg/dL 0.86 0.95 0.96  Sodium 135 - 145 mmol/L 136 135 137  Potassium 3.5 - 5.1 mmol/L 3.8 4.1 4.2  Chloride 101 - 111 mmol/L 106 104 105  CO2 22 - 32 mmol/L 22 27 26  Calcium 8.9 - 10.3 mg/dL 9.8 9.5 10.3  Total Protein 6.5 - 8.1 g/dL 8.2(H) - -  Total Bilirubin 0.3 - 1.2 mg/dL 0.7 - -  Alkaline Phos 38 - 126 U/L 118 - -  AST 15 - 41 U/L 24 - -  ALT 14 - 54 U/L 21 - -    Imaging: Xr Knee 3 View Right  Result Date: 12/21/2017 Moderate medial compartment narrowing with medial osteophytes and intercondylar osteophytes. No chondrocalcinosis was noted. Severe patellofemoral narrowing was noted. Impression: Moderate osteoarthritis and severe chondromalacia patella.  Xr Lumbar Spine 2-3 Views  Result Date: 12/21/2017 Mild spondylosis was noted no significant disc space narrowing was noted. Facet joint arthropathy was noted. No syndesmophytes were noted. Dextroscoliosis was noted. Impression: spondylosis and facet joint arthropathy of the lumbar spine.  Xr Pelvis 1-2 Views  Result Date: 12/21/2017 SI joints are not well visualized. Some SI joint changes were noted which could be consistent with osteoarthritis.   Speciality Comments: No specialty comments available.    Procedures:  No procedures performed Allergies: Aspirin; Other;  Contrast media [iodinated diagnostic agents]; Iodine-131; Ioxaglate; Penicillins; and Prednisone   Assessment / Plan:     Visit Diagnoses: Chronic SI joint pain - CT revealed degenerative changes and parital fusion of superior portion of right SI joint - Plan: XR Pelvis 1-2 Views, SI joints were not well visualized. I will obtain MRI of the SI joints to look for sacroiliitis. HLA-B27 antigen  DDD (degenerative disc disease), cervical: She has chronic discomfort in her C-spine.  DDD (degenerative disc disease), lumbar - Plan: XR Lumbar Spine 2-3 Views. X-ray was consistent with spondylosis and scoliosis. No syndesmophytes were noted.  Chronic pain of right knee - Plan: XR KNEE 3 VIEW RIGHT: She has mild to moderate osteoarthritis and chondromalacia patella. Weight loss diet and exercise were discussed.  Chronic right shoulder pain: She has chronic right shoulder joint pain and is going through physical therapy.  Positive ANA (antinuclear antibody) - she had positive ANA and positive double-stranded DNA at [facility]. She does not have any clinical features of autoimmune disease. She has history of autoimmune thyroiditis. I will obtain following labs today. Plan: Sedimentation rate, ANA, Anti-scleroderma antibody, RNP Antibody, Anti-Smith antibody, Sjogrens syndrome-A extractable nuclear antibody, Sjogrens syndrome-B extractable nuclear antibody, Anti-DNA antibody, double-stranded, C3 and C4, Beta-2 glycoprotein antibodies, Cardiolipin antibodies, IgG, IgM, IgA, Lupus Anticoagulant Eval w/Reflex, Urinalysis, Routine w reflex microscopic, Glucose 6 phosphate dehydrogenase  Other fatigue - Plan: CK, VITAMIN D 25 Hydroxy (Vit-D Deficiency, Fractures)  Fibromyalgia: She does not have much tender points today on exam   History of gastroesophageal reflux (GERD)  Hyperparathyroidism (H[facility])  Hashimoto's thyroiditis  Family history of rheumatoid arthritis    Orders: Orders Placed This Encounter    Procedures  . XR Pelvis 1-2 Views  . XR Lumbar Spine 2-3 Views  . XR KNEE 3 VIEW RIGHT  . MR SACRUM SI JOINTS WO CONTRAST  . CK  . VITAMIN D 25 Hydroxy (Vit-D Deficiency, Fractures)  . Sedimentation rate  . HLA-B27 antigen  . ANA  . Anti-scleroderma antibody  . RNP Antibody  . Anti-Smith antibody  . Sjogrens syndrome-A extractable nuclear antibody  . Sjogrens syndrome-B extractable nuclear antibody  . Anti-DNA antibody, double-stranded  . C3 and C4  . Beta-2 glycoprotein antibodies  . Cardiolipin antibodies, IgG, IgM, IgA  . Lupus Anticoagulant Eval w/Reflex  . Urinalysis, Routine w reflex microscopic  . Glucose 6 phosphate dehydrogenase   No orders of the  defined types were placed in this encounter.   Face-to-face time spent with patient was 60 minutes. Greater than 50% of time was spent in counseling and coordination of care.  Follow-Up Instructions: Return for Lower back pain, positive ANA.   Bo Merino, MD  Note - This record has been created using Editor, commissioning.  Chart creation errors have been sought, but may not always  have been located. Such creation errors do not reflect on  the standard of medical care.

## 2017-12-21 ENCOUNTER — Encounter: Payer: Self-pay | Admitting: Rheumatology

## 2017-12-21 ENCOUNTER — Ambulatory Visit (INDEPENDENT_AMBULATORY_CARE_PROVIDER_SITE_OTHER): Payer: Self-pay

## 2017-12-21 ENCOUNTER — Ambulatory Visit: Payer: 59 | Admitting: Rheumatology

## 2017-12-21 VITALS — BP 120/62 | HR 74 | Resp 16 | Ht 65.0 in | Wt 248.0 lb

## 2017-12-21 DIAGNOSIS — Z8261 Family history of arthritis: Secondary | ICD-10-CM | POA: Diagnosis not present

## 2017-12-21 DIAGNOSIS — E213 Hyperparathyroidism, unspecified: Secondary | ICD-10-CM | POA: Diagnosis not present

## 2017-12-21 DIAGNOSIS — Z8719 Personal history of other diseases of the digestive system: Secondary | ICD-10-CM | POA: Diagnosis not present

## 2017-12-21 DIAGNOSIS — M51369 Other intervertebral disc degeneration, lumbar region without mention of lumbar back pain or lower extremity pain: Secondary | ICD-10-CM

## 2017-12-21 DIAGNOSIS — M25561 Pain in right knee: Secondary | ICD-10-CM

## 2017-12-21 DIAGNOSIS — M5030 Other cervical disc degeneration, unspecified cervical region: Secondary | ICD-10-CM

## 2017-12-21 DIAGNOSIS — R768 Other specified abnormal immunological findings in serum: Secondary | ICD-10-CM

## 2017-12-21 DIAGNOSIS — R7689 Other specified abnormal immunological findings in serum: Secondary | ICD-10-CM

## 2017-12-21 DIAGNOSIS — M797 Fibromyalgia: Secondary | ICD-10-CM | POA: Diagnosis not present

## 2017-12-21 DIAGNOSIS — R5383 Other fatigue: Secondary | ICD-10-CM

## 2017-12-21 DIAGNOSIS — M533 Sacrococcygeal disorders, not elsewhere classified: Secondary | ICD-10-CM

## 2017-12-21 DIAGNOSIS — M5136 Other intervertebral disc degeneration, lumbar region: Secondary | ICD-10-CM

## 2017-12-21 DIAGNOSIS — E063 Autoimmune thyroiditis: Secondary | ICD-10-CM

## 2017-12-21 DIAGNOSIS — M25511 Pain in right shoulder: Secondary | ICD-10-CM

## 2017-12-21 DIAGNOSIS — G8929 Other chronic pain: Secondary | ICD-10-CM

## 2017-12-25 LAB — LUPUS ANTICOAGULANT EVAL W/ REFLEX
PTT LA SCREEN: 33 s (ref <=40)
dRVVT Screen: 40 s (ref <=45)

## 2017-12-25 LAB — URINALYSIS, ROUTINE W REFLEX MICROSCOPIC
Bilirubin Urine: NEGATIVE
Glucose, UA: NEGATIVE
Hgb urine dipstick: NEGATIVE
Ketones, ur: NEGATIVE
Leukocytes, UA: NEGATIVE
Nitrite: NEGATIVE
Protein, ur: NEGATIVE
Specific Gravity, Urine: 1.013 (ref 1.001–1.03)
pH: 5.5 (ref 5.0–8.0)

## 2017-12-25 LAB — CARDIOLIPIN ANTIBODIES, IGG, IGM, IGA
Anticardiolipin IgA: <11 [APL'U]
Anticardiolipin IgG: <14 [GPL'U]
Anticardiolipin IgM: <12 [MPL'U]

## 2017-12-25 LAB — SEDIMENTATION RATE: Sed Rate: 53 mm/h — ABNORMAL HIGH (ref 0–30)

## 2017-12-25 LAB — BETA-2 GLYCOPROTEIN ANTIBODIES
Beta-2 Glyco 1 IgA: <9 SAU (ref <=20)
Beta-2 Glyco 1 IgM: <9 SMU (ref <=20)
Beta-2 Glyco I IgG: <9 SGU (ref <=20)

## 2017-12-25 LAB — HLA-B27 ANTIGEN: HLA-B27 Antigen: NEGATIVE

## 2017-12-25 LAB — CK: Total CK: 156 U/L — ABNORMAL HIGH (ref 29–143)

## 2017-12-25 LAB — RNP ANTIBODY: Ribonucleic Protein(ENA) Antibody, IgG: <1 AI

## 2017-12-25 LAB — C3 AND C4
C3 Complement: 167 mg/dL (ref 83–193)
C4 Complement: 34 mg/dL (ref 15–57)

## 2017-12-25 LAB — SJOGRENS SYNDROME-A EXTRACTABLE NUCLEAR ANTIBODY: SSA (Ro) (ENA) Antibody, IgG: <1 AI

## 2017-12-25 LAB — ANTI-SCLERODERMA ANTIBODY: Scleroderma (Scl-70) (ENA) Antibody, IgG: <1 AI

## 2017-12-25 LAB — VITAMIN D 25 HYDROXY (VIT D DEFICIENCY, FRACTURES): Vit D, 25-Hydroxy: 32 ng/mL (ref 30–100)

## 2017-12-25 LAB — GLUCOSE 6 PHOSPHATE DEHYDROGENASE: G-6PDH: 15 U/g Hgb (ref 7.0–20.5)

## 2017-12-25 LAB — ANTI-DNA ANTIBODY, DOUBLE-STRANDED: ds DNA Ab: 1 IU/mL

## 2017-12-25 LAB — ANA: Anti Nuclear Antibody(ANA): NEGATIVE

## 2017-12-25 LAB — ANTI-SMITH ANTIBODY: ENA SM Ab Ser-aCnc: <1 AI

## 2017-12-25 LAB — SJOGRENS SYNDROME-B EXTRACTABLE NUCLEAR ANTIBODY: SSB (La) (ENA) Antibody, IgG: <1 AI

## 2017-12-25 NOTE — Progress Notes (Signed)
Were discussed at follow-up visit

## 2017-12-26 NOTE — Progress Notes (Signed)
Plan to discuss results at the follow-up visit

## 2018-01-01 ENCOUNTER — Ambulatory Visit (HOSPITAL_COMMUNITY)
Admission: RE | Admit: 2018-01-01 | Discharge: 2018-01-01 | Disposition: A | Discharge status: Home / Self Care | Payer: 59 | Source: Ambulatory Visit | Attending: Rheumatology | Admitting: Rheumatology

## 2018-01-01 DIAGNOSIS — M5127 Other intervertebral disc displacement, lumbosacral region: Secondary | ICD-10-CM | POA: Insufficient documentation

## 2018-01-01 DIAGNOSIS — G8929 Other chronic pain: Secondary | ICD-10-CM

## 2018-01-01 DIAGNOSIS — M533 Sacrococcygeal disorders, not elsewhere classified: Secondary | ICD-10-CM | POA: Insufficient documentation

## 2018-01-01 DIAGNOSIS — Z981 Arthrodesis status: Secondary | ICD-10-CM | POA: Diagnosis not present

## 2018-01-02 NOTE — Progress Notes (Signed)
MRI is consistent with osteoarthritis. She has no inflammation in the SI joints. These findings are not consistent with ankylosing spondylitis.

## 2018-01-04 DIAGNOSIS — M461 Sacroiliitis, not elsewhere classified: Secondary | ICD-10-CM | POA: Insufficient documentation

## 2018-01-04 DIAGNOSIS — M25511 Pain in right shoulder: Secondary | ICD-10-CM

## 2018-01-04 DIAGNOSIS — M51369 Other intervertebral disc degeneration, lumbar region without mention of lumbar back pain or lower extremity pain: Secondary | ICD-10-CM | POA: Insufficient documentation

## 2018-01-04 DIAGNOSIS — M5030 Other cervical disc degeneration, unspecified cervical region: Secondary | ICD-10-CM | POA: Insufficient documentation

## 2018-01-04 DIAGNOSIS — M170 Bilateral primary osteoarthritis of knee: Secondary | ICD-10-CM | POA: Insufficient documentation

## 2018-01-04 DIAGNOSIS — Z8639 Personal history of other endocrine, nutritional and metabolic disease: Secondary | ICD-10-CM | POA: Insufficient documentation

## 2018-01-04 DIAGNOSIS — M797 Fibromyalgia: Secondary | ICD-10-CM | POA: Insufficient documentation

## 2018-01-04 DIAGNOSIS — M5136 Other intervertebral disc degeneration, lumbar region: Secondary | ICD-10-CM | POA: Insufficient documentation

## 2018-01-04 DIAGNOSIS — E063 Autoimmune thyroiditis: Secondary | ICD-10-CM | POA: Insufficient documentation

## 2018-01-04 DIAGNOSIS — Z8261 Family history of arthritis: Secondary | ICD-10-CM | POA: Insufficient documentation

## 2018-01-04 DIAGNOSIS — M47818 Spondylosis without myelopathy or radiculopathy, sacral and sacrococcygeal region: Secondary | ICD-10-CM | POA: Insufficient documentation

## 2018-01-04 DIAGNOSIS — G8929 Other chronic pain: Secondary | ICD-10-CM | POA: Insufficient documentation

## 2018-01-04 DIAGNOSIS — Z8719 Personal history of other diseases of the digestive system: Secondary | ICD-10-CM | POA: Insufficient documentation

## 2018-01-04 NOTE — Progress Notes (Signed)
Office Visit Note  Patient: Meghan Owen             Date of Birth: 05-30-1956           MRN: [id number]             PCP: Harlan Stains, MD Referring: Harlan Stains, MD Visit Date: 01/11/2018 Occupation: _0 @    Subjective:  Pain in the neck and knees.   History of Present Illness: Meghan Owen is a 62 y.o. female with history of osteoarthritis and disc disease.  She states she continues to have discomfort in her C-spine, lumbar spine and her joints in upper and lower extremities.  She continues to have bilateral knee joint discomfort.  She denies any joint swelling.  Activities of Daily Living:  Patient reports morning stiffness for 30  minutes.   Patient Reports nocturnal pain.  Difficulty dressing/grooming: Denies Difficulty climbing stairs: Reports Difficulty getting out of chair: Reports Difficulty using hands for taps, buttons, cutlery, and/or writing: Reports   Review of Systems  Constitutional: Positive for fatigue. Negative for night sweats, weight gain, weight loss and weakness.  HENT: Negative for mouth sores, trouble swallowing, trouble swallowing, mouth dryness and nose dryness.   Eyes: Negative for pain, redness, visual disturbance and dryness.  Respiratory: Negative.  Negative for cough, shortness of breath and difficulty breathing.   Cardiovascular: Negative.  Negative for chest pain, palpitations, hypertension, irregular heartbeat and swelling in legs/feet.  Gastrointestinal: Positive for constipation. Negative for blood in stool and diarrhea.  Endocrine: Negative.  Negative for increased urination.  Genitourinary: Negative.  Negative for nocturia and vaginal dryness.  Musculoskeletal: Positive for arthralgias, joint pain, myalgias, muscle weakness and myalgias. Negative for joint swelling, morning stiffness and muscle tenderness.  Skin: Negative.  Negative for color change, rash, hair loss, skin tightness, ulcers and sensitivity to sunlight.    Allergic/Immunologic: Negative for susceptible to infections.  Neurological: Negative for dizziness, numbness, headaches, memory loss and night sweats.  Hematological: Negative.  Negative for swollen glands.  Psychiatric/Behavioral: Positive for sleep disturbance. Negative for depressed mood. The patient is not nervous/anxious.     PMFS History:  Patient Active Problem List   Diagnosis Date Noted  . Osteoarthritis of both sacroiliac joints 01/04/2018  . Primary osteoarthritis of both knees 01/04/2018  . DDD (degenerative disc disease), cervical 01/04/2018  . DDD (degenerative disc disease), lumbar 01/04/2018  . Chronic right shoulder pain 01/04/2018  . Fibromyalgia 01/04/2018  . Hashimoto's thyroiditis 01/04/2018  . History of hyperparathyroidism 01/04/2018  . History of gastroesophageal reflux (GERD) 01/04/2018  . Family history of rheumatoid arthritis 01/04/2018  . Cervical myelopathy ([place]) 09/05/2016  . GERD 02/05/2009  . OTHER BURSITIS DISORDERS 02/05/2009  . ABNORMAL ELECTROCARDIOGRAM 02/05/2009    Past Medical History:  Diagnosis Date  . Arthritis   . Fibromyalgia   . GERD (gastroesophageal reflux disease)    HX  . Hashimoto's thyroiditis   . Headache   . Hypothyroidism   . Liver cyst    BENIGN  . Parathyroid disorder ([place])    HYPERPARATHYROID  . Spinal stenosis   . Thyroid disease     Family History  Problem Relation Age of Onset  . Lupus Mother   . Rheum arthritis Mother   . Dementia Father   . Leukemia Father   . Cancer Other   . Cancer Sister        bone   . Healthy Son   . Healthy Son   . Healthy  Daughter    Past Surgical History:  Procedure Laterality Date  . ABDOMINAL HYSTERECTOMY    . ANKLE SURGERY    . ANTERIOR CERVICAL DECOMPRESSION/DISCECTOMY FUSION 4 LEVELS N/A 09/05/2016   Procedure: Cervical three-four Cervical four-five  Cervical five-six Cervical six-seven  Anterior cervical decompression/diskectomy/fusion;  Surgeon: Erline Levine, MD;   Location: [facility];  Service: Neurosurgery;  Laterality: N/A;  . ELBOW SURGERY  04/2017   cyst removed   . FOOT SURGERY     Social History   Social History Narrative  . Not on file     Objective: Vital Signs: BP 120/72   Pulse 68   Resp 16   Ht 5' 5" (1.651 m)   Wt 250 lb (113.4 kg)   BMI 41.60 kg/m    Physical Exam  Constitutional: She is oriented to person, place, and time. She appears well-developed and well-nourished.  HENT:  Head: Normocephalic and atraumatic.  Eyes: Conjunctivae and EOM are normal.  Neck: Normal range of motion.  Cardiovascular: Normal rate, regular rhythm, normal heart sounds and intact distal pulses.  Pulmonary/Chest: Effort normal and breath sounds normal.  Abdominal: Soft. Bowel sounds are normal.  Lymphadenopathy:    She has no cervical adenopathy.  Neurological: She is alert and oriented to person, place, and time.  Skin: Skin is warm and dry. Capillary refill takes less than 2 seconds.  Psychiatric: She has a normal mood and affect. Her behavior is normal.  Nursing note and vitals reviewed.    Musculoskeletal Exam: C-spine thoracic lumbar spine limited range of motion.  Shoulder joints elbows joints wrist joint MCPs PIPs DIPs with good range of motion with no synovitis.  She has some discomfort range of motion of her shoulder joints.  Hip joints knee joints ankles MTPs PIPs DIPs with good range of motion.  She has no difficulty getting up from chair without support.  No muscular weakness or tenderness was noted.  CDAI Exam: No CDAI exam completed.    Investigation: No additional findings. 12/21/2017 UA negative, CK 156 mildly elevated, vitamin D 32 G6PD normal ESR 53, ANA negative, ENA negative, beta 2 negative, anticardiolipin negative, lupus anticoagulant negative, C3-C4 normal, HLA-B27 negative  Imaging: Mr Sacrum Si Joints Wo Contrast  Result Date: 01/02/2018 CLINICAL DATA:  Chronic SI joint pain. CT scan demonstrates fusion  of the superior aspect of the right SI joint. EXAM: MRI SACRUM WITHOUT CONTRAST TECHNIQUE: Multiplanar, multisequence MR imaging of the lumbar spine was performed. No intravenous contrast was administered. COMPARISON:  CT scan of the abdomen dated 10/24/2017 and MRI of the lumbar spine dated 11/26/2014 and MRI of the hips dated 12/21/2008 FINDINGS: There is a small focal soft disc extrusion just to the right of midline at L5-S1 adjacent to the right S1 nerve root sleeve best seen on images 12 and 13 of series 3 and on image 17 of series 6. There are slight sclerotic changes of the iliac sides of both SI joints with no edema. There is sclerosis at the site of fusion of the superior aspect of the right SI joint. No SI joint inflammation. No mass lesions or adenopathy. Uterus has been removed. Ovaries appear normal. Bladder appears normal. Courses of the sciatic nerves appear normal. IMPRESSION: 1. No significant abnormality of the sacroiliac joints. Chronic fusion of the superior aspect of the right SI joint. 2. Small focal disc extrusion at L5-S1 to the right adjacent to the right S1 nerve root sleeve. Electronically Signed   By: Jeneen Rinks  Maxwell M.D.   On: 01/02/2018 07:57   Xr Knee 3 View Right  Result Date: 12/21/2017 Moderate medial compartment narrowing with medial osteophytes and intercondylar osteophytes. No chondrocalcinosis was noted. Severe patellofemoral narrowing was noted. Impression: Moderate osteoarthritis and severe chondromalacia patella.  Xr Lumbar Spine 2-3 Views  Result Date: 12/21/2017 Mild spondylosis was noted no significant disc space narrowing was noted. Facet joint arthropathy was noted. No syndesmophytes were noted. Dextroscoliosis was noted. Impression: spondylosis and facet joint arthropathy of the lumbar spine.  Xr Pelvis 1-2 Views  Result Date: 12/21/2017 SI joints are not well visualized. Some SI joint changes were noted which could be consistent with  osteoarthritis.   Speciality Comments: No specialty comments available.    Procedures:  No procedures performed Allergies: Aspirin; Other; Contrast media [iodinated diagnostic agents]; Iodine-131; Ioxaglate; Penicillins; and Prednisone   Assessment / Plan:     Visit Diagnoses: Osteoarthritis of both sacroiliac joints - HLA-B27 negative, MRI negative for sacroiliitis.    Primary osteoarthritis of both knees - Bilateral moderate with moderate chondromalacia patella.  She continues to have some joint discomfort.  Weight loss diet and exercise was discussed.  DDD (degenerative disc disease), cervical: Chronic pain  DDD (degenerative disc disease), lumbar: Chronic pain  Chronic right shoulder pain: She has been having right shoulder joint discomfort.  Fibromyalgia: She does have generalized pain and episodic increased pain.  Elevated ESR: Patient gives history of elevated ESR for multiple years.  I do not have those values to look at.  I am uncertain about the true etiology of her elevated ESR.  She has no clinical features of PMR.  Other medical problems are listed as follows.  Hashimoto's thyroiditis  History of hyperparathyroidism  History of gastroesophageal reflux (GERD)  Family history of rheumatoid arthritis  Class 3 severe obesity due to excess calories without serious comorbidity with body mass index (BMI) of 40.0 to 44.9 in adult [facility]): Weight loss diet and exercise was discussed.   Orders: No orders of the defined types were placed in this encounter.  No orders of the defined types were placed in this encounter.   Face-to-face time spent with patient was 30 minutes.  Greater than 50% of time was spent in counseling and coordination of care.  Follow-Up Instructions: Return in about 6 months (around 07/11/2018).   Bo Merino, MD  Note - This record has been created using Editor, commissioning.  Chart creation errors have been sought, but may not always  have  been located. Such creation errors do not reflect on  the standard of medical care.

## 2018-01-11 ENCOUNTER — Ambulatory Visit: Payer: 59 | Admitting: Rheumatology

## 2018-01-11 ENCOUNTER — Encounter: Payer: Self-pay | Admitting: Rheumatology

## 2018-01-11 VITALS — BP 120/72 | HR 68 | Resp 16 | Ht 65.0 in | Wt 250.0 lb

## 2018-01-11 DIAGNOSIS — Z6841 Body Mass Index (BMI) 40.0 and over, adult: Secondary | ICD-10-CM

## 2018-01-11 DIAGNOSIS — M5030 Other cervical disc degeneration, unspecified cervical region: Secondary | ICD-10-CM

## 2018-01-11 DIAGNOSIS — M5136 Other intervertebral disc degeneration, lumbar region: Secondary | ICD-10-CM | POA: Diagnosis not present

## 2018-01-11 DIAGNOSIS — Z8261 Family history of arthritis: Secondary | ICD-10-CM

## 2018-01-11 DIAGNOSIS — M47818 Spondylosis without myelopathy or radiculopathy, sacral and sacrococcygeal region: Secondary | ICD-10-CM | POA: Diagnosis not present

## 2018-01-11 DIAGNOSIS — M461 Sacroiliitis, not elsewhere classified: Secondary | ICD-10-CM

## 2018-01-11 DIAGNOSIS — E063 Autoimmune thyroiditis: Secondary | ICD-10-CM

## 2018-01-11 DIAGNOSIS — M170 Bilateral primary osteoarthritis of knee: Secondary | ICD-10-CM

## 2018-01-11 DIAGNOSIS — G8929 Other chronic pain: Secondary | ICD-10-CM

## 2018-01-11 DIAGNOSIS — M25511 Pain in right shoulder: Secondary | ICD-10-CM

## 2018-01-11 DIAGNOSIS — M797 Fibromyalgia: Secondary | ICD-10-CM

## 2018-01-11 DIAGNOSIS — Z8719 Personal history of other diseases of the digestive system: Secondary | ICD-10-CM

## 2018-01-11 DIAGNOSIS — Z8639 Personal history of other endocrine, nutritional and metabolic disease: Secondary | ICD-10-CM

## 2018-01-24 DIAGNOSIS — G5602 Carpal tunnel syndrome, left upper limb: Secondary | ICD-10-CM | POA: Diagnosis not present

## 2018-02-25 DIAGNOSIS — G5602 Carpal tunnel syndrome, left upper limb: Secondary | ICD-10-CM | POA: Diagnosis not present

## 2018-02-26 HISTORY — PX: CARPAL TUNNEL RELEASE: SHX101

## 2018-03-07 DIAGNOSIS — M170 Bilateral primary osteoarthritis of knee: Secondary | ICD-10-CM | POA: Diagnosis not present

## 2018-03-07 DIAGNOSIS — M5136 Other intervertebral disc degeneration, lumbar region: Secondary | ICD-10-CM | POA: Diagnosis not present

## 2018-03-07 DIAGNOSIS — M5030 Other cervical disc degeneration, unspecified cervical region: Secondary | ICD-10-CM | POA: Diagnosis not present

## 2018-03-11 DIAGNOSIS — G5602 Carpal tunnel syndrome, left upper limb: Secondary | ICD-10-CM | POA: Diagnosis not present

## 2018-03-16 IMAGING — CR DG CERVICAL SPINE 2 OR 3 VIEWS
1 series · 1 of 1 positions shown · non-contrast
Comparison: Cervical spine films of 07/20/2016 and CT of the neck
of 07/20/2016

CLINICAL DATA: Anterior fusion from C3-C7

EXAM:
CERVICAL SPINE - 2-3 VIEW

[lat]
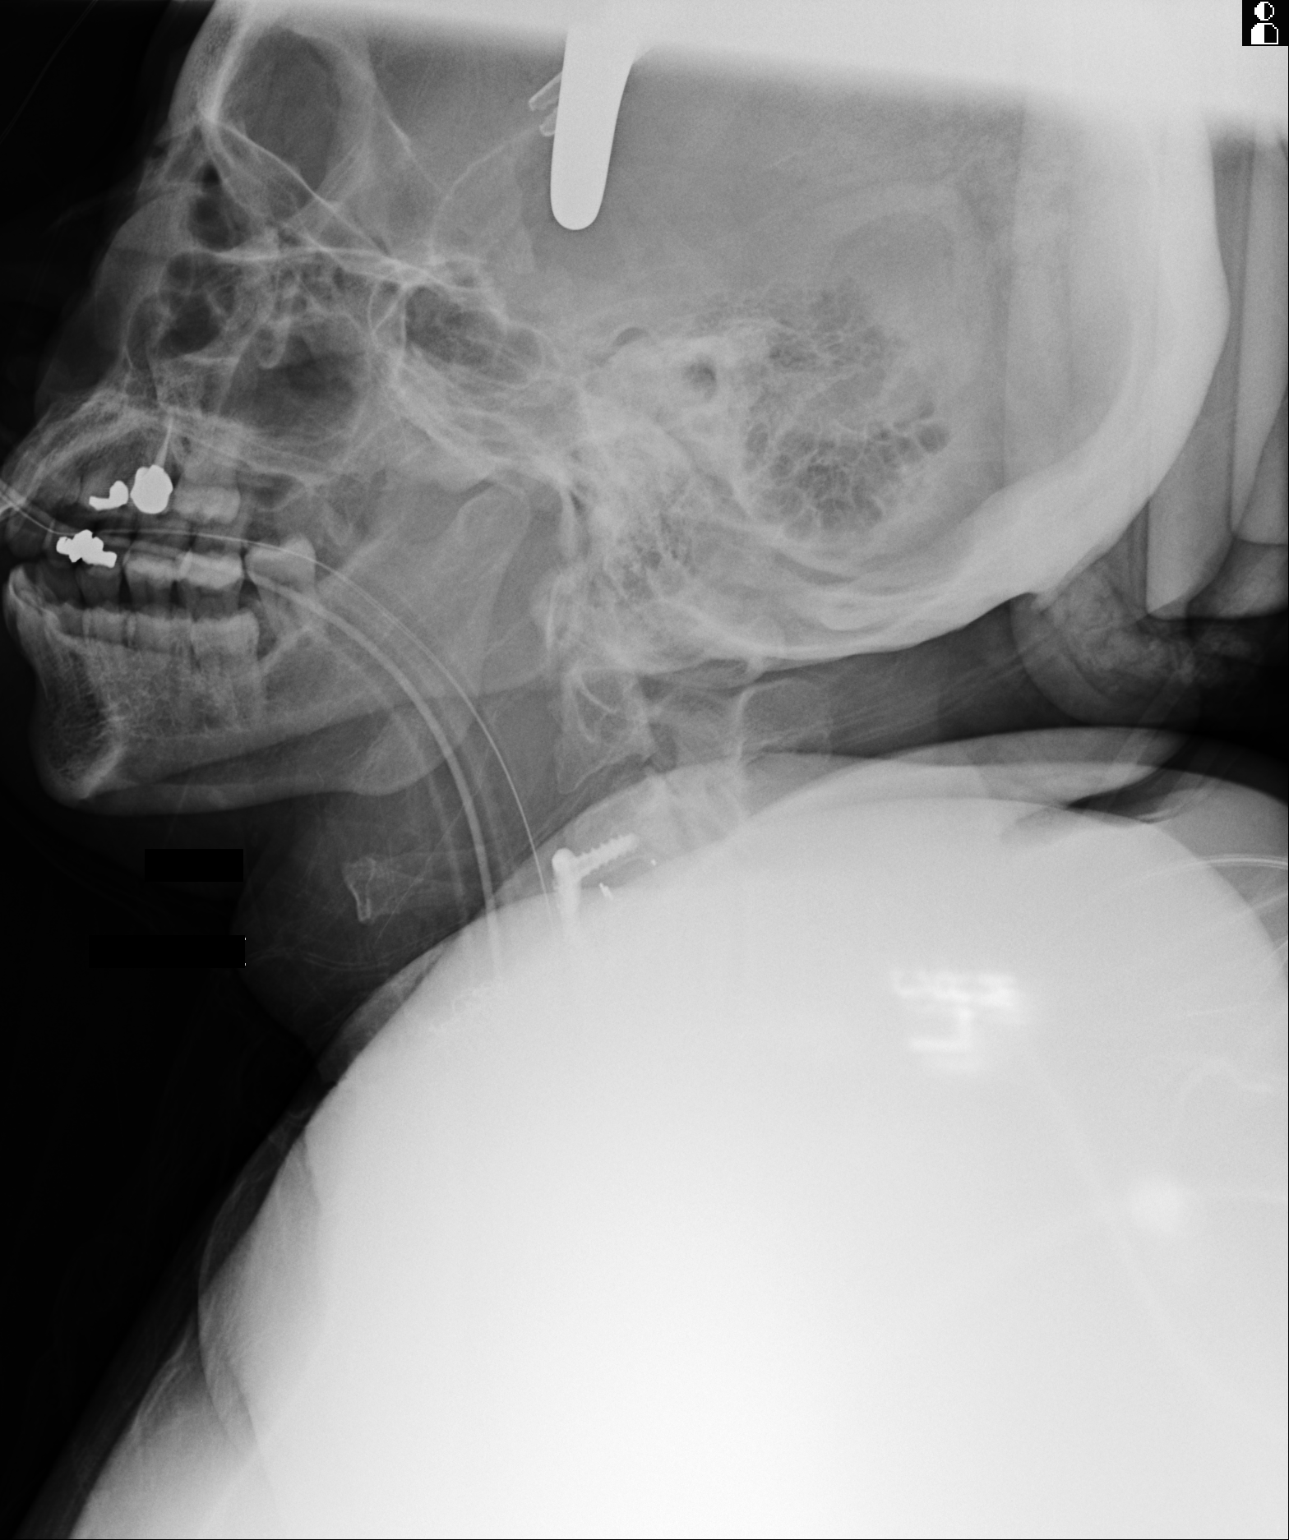

[1 of 1 positions shown; findings below may reference images not displayed]

FINDINGS: On the cross-table lateral view labeled 1, a needle is positioned
anteriorly contracted toward the C3-4 interspace. On the second film
anterior fusion plate is noted from C3 to C4 with the C4-5 area not
well seen due to the patient's shoulders.
IMPRESSION: Anterior fusion at C3-4 with the C4-5 level not visualized on the
current portable cross-table view.

## 2018-03-18 DIAGNOSIS — M7741 Metatarsalgia, right foot: Secondary | ICD-10-CM | POA: Diagnosis not present

## 2018-03-18 DIAGNOSIS — D2371 Other benign neoplasm of skin of right lower limb, including hip: Secondary | ICD-10-CM | POA: Diagnosis not present

## 2018-07-01 DIAGNOSIS — H35412 Lattice degeneration of retina, left eye: Secondary | ICD-10-CM | POA: Diagnosis not present

## 2018-07-01 DIAGNOSIS — H2513 Age-related nuclear cataract, bilateral: Secondary | ICD-10-CM | POA: Diagnosis not present

## 2018-07-01 DIAGNOSIS — H5712 Ocular pain, left eye: Secondary | ICD-10-CM | POA: Diagnosis not present

## 2018-07-03 NOTE — Progress Notes (Deleted)
Office Visit Note  Patient: Meghan Owen             Date of Birth: [birth year]           MRN: [id number]             PCP: Harlan Stains, MD Referring: Harlan Stains, MD Visit Date: 07/16/2018 Occupation: @GUAROCC @  Subjective:  No chief complaint on file.   History of Present Illness: Meghan Owen is a 62 y.o. female ***   Activities of Daily Living:  Patient reports morning stiffness for *** {minute/hour:19697}.   Patient {ACTIONS;DENIES/REPORTS:21021675::"Denies"} nocturnal pain.  Difficulty dressing/grooming: {ACTIONS;DENIES/REPORTS:21021675::"Denies"} Difficulty climbing stairs: {ACTIONS;DENIES/REPORTS:21021675::"Denies"} Difficulty getting out of chair: {ACTIONS;DENIES/REPORTS:21021675::"Denies"} Difficulty using hands for taps, buttons, cutlery, and/or writing: {ACTIONS;DENIES/REPORTS:21021675::"Denies"}  No Rheumatology ROS completed.   PMFS History:  Patient Active Problem List   Diagnosis Date Noted  . Osteoarthritis of both sacroiliac joints 01/04/2018  . Primary osteoarthritis of both knees 01/04/2018  . DDD (degenerative disc disease), cervical 01/04/2018  . DDD (degenerative disc disease), lumbar 01/04/2018  . Chronic right shoulder pain 01/04/2018  . Fibromyalgia 01/04/2018  . Hashimoto's thyroiditis 01/04/2018  . History of hyperparathyroidism 01/04/2018  . History of gastroesophageal reflux (GERD) 01/04/2018  . Family history of rheumatoid arthritis 01/04/2018  . Cervical myelopathy ([place]) 09/05/2016  . GERD 02/05/2009  . OTHER BURSITIS DISORDERS 02/05/2009  . ABNORMAL ELECTROCARDIOGRAM 02/05/2009    Past Medical History:  Diagnosis Date  . Arthritis   . Fibromyalgia   . GERD (gastroesophageal reflux disease)    HX  . Hashimoto's thyroiditis   . Headache   . Hypothyroidism   . Liver cyst    BENIGN  . Parathyroid disorder ([place])    HYPERPARATHYROID  . Spinal stenosis   . Thyroid disease     Family History  Problem Relation Age of Onset   . Lupus Mother   . Rheum arthritis Mother   . Dementia Father   . Leukemia Father   . Cancer Other   . Cancer Sister        bone   . Healthy Son   . Healthy Son   . Healthy Daughter    Past Surgical History:  Procedure Laterality Date  . ABDOMINAL HYSTERECTOMY    . ANKLE SURGERY    . ANTERIOR CERVICAL DECOMPRESSION/DISCECTOMY FUSION 4 LEVELS N/A 09/05/2016   Procedure: Cervical three-four Cervical four-five  Cervical five-six Cervical six-seven  Anterior cervical decompression/diskectomy/fusion;  Surgeon: Erline Levine, MD;  Location: [facility];  Service: Neurosurgery;  Laterality: N/A;  . ELBOW SURGERY  04/2017   cyst removed   . FOOT SURGERY     Social History   Social History Narrative  . Not on file    Objective: Vital Signs: There were no vitals taken for this visit.   Physical Exam   Musculoskeletal Exam: ***  CDAI Exam: No CDAI exam completed.   Investigation: No additional findings.  Imaging: No results found.  Recent Labs: Lab Results  Component Value Date   WBC 7.2 10/24/2017   HGB 12.7 10/24/2017   PLT 188 10/24/2017   NA 136 10/24/2017   K 3.8 10/24/2017   CL 106 10/24/2017   CO2 22 10/24/2017   GLUCOSE 87 10/24/2017   BUN 9 10/24/2017   CREATININE 0.86 10/24/2017   BILITOT 0.7 10/24/2017   ALKPHOS 118 10/24/2017   AST 24 10/24/2017   ALT 21 10/24/2017   PROT 8.2 (H) 10/24/2017   ALBUMIN 3.9 10/24/2017   CALCIUM  9.8 10/24/2017   GFRAA >60 10/24/2017    Speciality Comments: No specialty comments available.  Procedures:  No procedures performed Allergies: Aspirin; Other; Contrast media [iodinated diagnostic agents]; Iodine-131; Ioxaglate; Penicillins; and Prednisone   Assessment / Plan:     Visit Diagnoses: No diagnosis found.   Orders: No orders of the defined types were placed in this encounter.  No orders of the defined types were placed in this encounter.   Face-to-face time spent with patient was *** minutes. Greater  than 50% of time was spent in counseling and coordination of care.  Follow-Up Instructions: No follow-ups on file.   Earnestine Mealing, CMA  Note - This record has been created using Editor, commissioning.  Chart creation errors have been sought, but may not always  have been located. Such creation errors do not reflect on  the standard of medical care.

## 2018-07-12 ENCOUNTER — Other Ambulatory Visit: Payer: Self-pay | Admitting: Internal Medicine

## 2018-07-12 DIAGNOSIS — E210 Primary hyperparathyroidism: Secondary | ICD-10-CM

## 2018-07-12 DIAGNOSIS — E063 Autoimmune thyroiditis: Secondary | ICD-10-CM

## 2018-07-12 DIAGNOSIS — E042 Nontoxic multinodular goiter: Secondary | ICD-10-CM | POA: Diagnosis not present

## 2018-07-16 ENCOUNTER — Ambulatory Visit: Payer: 59 | Admitting: Rheumatology

## 2018-08-22 ENCOUNTER — Ambulatory Visit
Admission: RE | Admit: 2018-08-22 | Discharge: 2018-08-22 | Disposition: A | Discharge status: Home / Self Care | Payer: 59 | Source: Ambulatory Visit | Attending: Internal Medicine | Admitting: Internal Medicine

## 2018-08-22 DIAGNOSIS — E210 Primary hyperparathyroidism: Secondary | ICD-10-CM

## 2018-08-22 DIAGNOSIS — E042 Nontoxic multinodular goiter: Secondary | ICD-10-CM

## 2018-08-22 DIAGNOSIS — E041 Nontoxic single thyroid nodule: Secondary | ICD-10-CM | POA: Diagnosis not present

## 2018-08-22 DIAGNOSIS — E063 Autoimmune thyroiditis: Secondary | ICD-10-CM

## 2018-08-27 NOTE — Progress Notes (Signed)
Office Visit Note  Patient: Meghan Owen             Date of Birth: May 29, 1956           MRN: [id number]             PCP: Harlan Stains, MD Referring: Harlan Stains, MD Visit Date: 09/09/2018 Occupation: '@GUAROCC'$ @  Subjective:  Lower back pain   History of Present Illness: Meghan Owen is a 62 y.o. female with history of osteoarthritis, DDD, and fibromyalgia.  She presents today with chronic lower back pain.  She states that her pain and stiffness has been worsening recently.  She states that her stiffness has been lasting for about 3 hours every morning.  She states she is also has been having increased groin pain.  She says she has been having some difficulty getting in and out of a chair and not out of the car.  She states that she does not have much discomfort in bilateral SI joints.  She states she continues to have chronic pain in both knee joints.  She denies any muscle aches or muscle tenderness at this time.  She states that she has chronic fatigue due to hypothyroidism.  She continues to have chronic neck pain and left-sided radiculopathy.  She had a fusion performed by Dr. Vertell Limber in the past.  She does not take any over-the-counter pain relievers and does not use a heating pad.  She states that as the day progressed her stiffness improves.     Activities of Daily Living:  Patient reports morning stiffness for several hours.   Patient Reports nocturnal pain.  Difficulty dressing/grooming: Denies Difficulty climbing stairs: Reports Difficulty getting out of chair: Denies Difficulty using hands for taps, buttons, cutlery, and/or writing: Denies  Review of Systems  Constitutional: Positive for fatigue.  HENT: Negative for mouth sores, trouble swallowing, trouble swallowing, mouth dryness and nose dryness.   Eyes: Negative for pain, visual disturbance and dryness.  Respiratory: Negative for cough, hemoptysis, shortness of breath and difficulty breathing.   Cardiovascular:  Negative for chest pain, palpitations, hypertension and swelling in legs/feet.  Gastrointestinal: Negative for blood in stool, constipation, diarrhea, nausea and vomiting.  Endocrine: Positive for increased urination.  Genitourinary: Negative for painful urination and nocturia.  Musculoskeletal: Positive for arthralgias, joint pain, joint swelling and morning stiffness. Negative for myalgias, muscle weakness, muscle tenderness and myalgias.  Skin: Negative for color change, pallor, rash, hair loss, nodules/bumps, skin tightness, ulcers and sensitivity to sunlight.  Allergic/Immunologic: Negative for susceptible to infections.  Neurological: Negative for dizziness, light-headedness, numbness, memory loss and weakness.  Hematological: Negative for swollen glands.  Psychiatric/Behavioral: Negative for depressed mood, confusion and sleep disturbance. The patient is not nervous/anxious.     PMFS History:  Patient Active Problem List   Diagnosis Date Noted  . Osteoarthritis of both sacroiliac joints 01/04/2018  . Primary osteoarthritis of both knees 01/04/2018  . DDD (degenerative disc disease), cervical 01/04/2018  . DDD (degenerative disc disease), lumbar 01/04/2018  . Chronic right shoulder pain 01/04/2018  . Fibromyalgia 01/04/2018  . Hashimoto's thyroiditis 01/04/2018  . History of hyperparathyroidism 01/04/2018  . History of gastroesophageal reflux (GERD) 01/04/2018  . Family history of rheumatoid arthritis 01/04/2018  . Cervical myelopathy ([place]) 09/05/2016  . GERD 02/05/2009  . OTHER BURSITIS DISORDERS 02/05/2009  . ABNORMAL ELECTROCARDIOGRAM 02/05/2009    Past Medical History:  Diagnosis Date  . Arthritis   . Fibromyalgia   . GERD (gastroesophageal reflux disease)  HX  . Hashimoto's thyroiditis   . Headache   . Hypothyroidism   . Liver cyst    BENIGN  . Parathyroid disorder ([place])    HYPERPARATHYROID  . Spinal stenosis   . Thyroid disease     Family History  Problem  Relation Age of Onset  . Lupus Mother   . Rheum arthritis Mother   . Dementia Father   . Leukemia Father   . Cancer Other   . Cancer Sister        bone   . Healthy Son   . Healthy Son   . Healthy Daughter    Past Surgical History:  Procedure Laterality Date  . ABDOMINAL HYSTERECTOMY    . ANKLE SURGERY    . ANTERIOR CERVICAL DECOMPRESSION/DISCECTOMY FUSION 4 LEVELS N/A 09/05/2016   Procedure: Cervical three-four Cervical four-five  Cervical five-six Cervical six-seven  Anterior cervical decompression/diskectomy/fusion;  Surgeon: Erline Levine, MD;  Location: [facility];  Service: Neurosurgery;  Laterality: N/A;  . CARPAL TUNNEL RELEASE Left 02/26/2018  . ELBOW SURGERY  04/2017   cyst removed   . FOOT SURGERY     Social History   Social History Narrative  . Not on file    Objective: Vital Signs: BP 124/79 (BP Location: Left Arm, Patient Position: Sitting, Cuff Size: Large)   Pulse (!) 58   Resp 15   Ht '5\' 5"'$  (1.651 m)   Wt 231 lb 12.8 oz (105.1 kg)   BMI 38.57 kg/m    Physical Exam  Constitutional: She is oriented to person, place, and time. She appears well-developed and well-nourished.  HENT:  Head: Normocephalic and atraumatic.  Left temporal artery tenderness.  Eyes: Conjunctivae and EOM are normal.  Neck: Normal range of motion.  Cardiovascular: Normal rate, regular rhythm, normal heart sounds and intact distal pulses.  Pulmonary/Chest: Effort normal and breath sounds normal.  Abdominal: Soft. Bowel sounds are normal.  Lymphadenopathy:    She has no cervical adenopathy.  Neurological: She is alert and oriented to person, place, and time.  Skin: Skin is warm and dry. Capillary refill takes less than 2 seconds.  Psychiatric: She has a normal mood and affect. Her behavior is normal.  Nursing note and vitals reviewed.    Musculoskeletal Exam: C-spine limited ROM.  Limited ROM of lumbar spine.  Bilateral SI joint tenderness.  Shoulder joints good ROM with no  discomfort.  Muscle tenderness in bilateral upper extremities. Elbow joints, wrist joints, MCPs, PIPs, and DIPs good ROM with no synovitis.  Complete fist formation bilaterally.  Knee joints good ROM with no discomfort.  No warmth or effusion noted.  Bilateral knee crepitus.  Ankle joints good ROM with no discomfort.  No warmth or effusion of ankle joints.   CDAI Exam: CDAI Score: Not documented Patient Global Assessment: Not documented; Provider Global Assessment: Not documented Swollen: Not documented; Tender: Not documented Joint Exam   Not documented   There is currently no information documented on the homunculus. Go to the Rheumatology activity and complete the homunculus joint exam.  Investigation: No additional findings.  Imaging: US Thyroid  Result Date: 08/22/2018 CLINICAL DATA:  Prior ultrasound follow-up. Hyperparathyroidism. Multiple thyroid nodules. Hashimoto's thyroiditis. EXAM: THYROID ULTRASOUND TECHNIQUE: Ultrasound examination of the thyroid gland and adjacent soft tissues was performed. COMPARISON:  10/19/2016 FINDINGS: Parenchymal Echotexture: Moderately heterogenous Isthmus: 0.5 cm, previously 0.5 cm Right lobe: 5.2 x 1.9 x 2.0 cm, previously 5.3 x 1.9 x 2.2 cm Left lobe: 4.1 x 1.7 x 1.9  cm, previously 4.1 x 1.6 x 1.9 cm _________________________________________________________ Estimated total number of nodules >/= 1 cm: 0 Number of spongiform nodules >/=  2 cm not described below (TR1): 0 Number of mixed cystic and solid nodules >/= 1.5 cm not described below ([place]): 0 _________________________________________________________ There is heterogeneous glandular tissue without discrete nodule in the gland. There is a hypoechoic nodule posterior to the lower pole of the right lobe measuring 0.5 cm. It is outside of the gland and may represent a parathyroid nodule. 0.8 cm hypoechoic nodule posterior to the lower pole of the left lobe previously measured 0.9 cm. It is stable. It is  outside of the gland and may represent a parathyroid nodule. IMPRESSION: Heterogeneous tissue without discrete nodule in the gland. 0.5 cm hypoechoic nodule separate from the thyroid gland and posterior to the lower pole of the right lobe. This may represent a parathyroid adenoma. Correlate clinically as for the need for nuclear medicine imaging. 0.8 cm hypoechoic nodule posterior to the lower pole of the left lobe is stable. Again, this may represent a parathyroid nodule. The above is in keeping with the ACR TI-RADS recommendations - J Am Coll Radiol 2017;14:587-595. Electronically Signed   By: Marybelle Killings M.D.   On: 08/22/2018 14:07    Recent Labs: Lab Results  Component Value Date   WBC 7.2 10/24/2017   HGB 12.7 10/24/2017   PLT 188 10/24/2017   NA 136 10/24/2017   K 3.8 10/24/2017   CL 106 10/24/2017   CO2 22 10/24/2017   GLUCOSE 87 10/24/2017   BUN 9 10/24/2017   CREATININE 0.86 10/24/2017   BILITOT 0.7 10/24/2017   ALKPHOS 118 10/24/2017   AST 24 10/24/2017   ALT 21 10/24/2017   PROT 8.2 (H) 10/24/2017   ALBUMIN 3.9 10/24/2017   CALCIUM 9.8 10/24/2017   GFRAA >60 10/24/2017    Speciality Comments: No specialty comments available.  Procedures:  No procedures performed Allergies: Aspirin; Other; Contrast media [iodinated diagnostic agents]; Iodine-131; Ioxaglate; Penicillins; and Prednisone   Assessment / Plan:     Visit Diagnoses: Osteoarthritis of both sacroiliac joints - HLA-B27 negative, MRI negative for sacroiliitis: She has bilateral SI joint tenderness.  She has been having increased stiffness in her lower back lasting up to 3 hours in the morning.  Primary osteoarthritis of both knees - Bilateral moderate with moderate chondromalacia patella: She has chronic pain in both knee joints.  No warmth or effusion was noted.  She has bilateral knee crepitus.  She is good range of motion on exam.  DDD (degenerative disc disease), cervical - S/p fusion: She has limited range  of motion of the C-spine.  She has left-sided radiculopathy.  She has not followed up with Dr. Vertell Limber recently.  DDD (degenerative disc disease), lumbar: Chronic pain.  She has no midline spinal tenderness on exam today.  She has bilateral SI joint tenderness.  Elevated sed rate -She has left temporal artery tenderness on exam today.  4+/5 strength of bilateral upper extremities.  She has muscle tenderness of bilateral upper extremities. 5+/5 muscle strength of bilateral lower extremities.  She has no difficulty getting up from a chair.  She has been having left sided headaches for the past 2 weeks with intermittent blurry vision.  She also reports intermittent jaw pain. The disease process of temporal arteritis was discussed. She gives history of chronic elevated sed rate-sed rate was 53 on 12/21/2017.  We are rechecking sed rate today.   We discussed the risk  of vision loss with untreated temporal arteritis.  She declined a prednisone taper at this time due to history of recurrent infections while on prednisone. We discussed the risks and benefits of prednisone use.  Depending on sed rate level, we will order a temporal artery biopsy.  She would like a second opinion at [facility].  We placed an urgent referral today.  We discussed the risks of undiagnosed and untreated temporal arteritis. Plan: Sedimentation rate, COMPLETE METABOLIC PANEL WITH GFR, CBC with Differential/Platelet  Chronic tension-type headache, not intractable - She has been having left sided headaches for the past 2 weeks with intermittent blurry vision.  She has left temporal artery tenderness.  Sed rate will be checked today.  A referral to [facility] rheumatology was placed today. Plan: Sedimentation rate   Fibromyalgia: She does not feel as though she has fibromyalgia.  She has tenderness of bilateral upper extremities on exam.  She has no lower extremity muscle aches or muscle tenderness.  She has no other lower extremity  muscle weakness.  Other medical conditions are listed as follows:   Hashimoto's thyroiditis  History of gastroesophageal reflux (GERD)  History of hyperparathyroidism  Other fatigue  Class 3 severe obesity due to excess calories without serious comorbidity with body mass index (BMI) of 40.0 to 44.9 in adult [facility])  Family history of rheumatoid arthritis    Orders: Orders Placed This Encounter  Procedures  . Sedimentation rate  . COMPLETE METABOLIC PANEL WITH GFR  . CBC with Differential/Platelet   No orders of the defined types were placed in this encounter.   Face-to-face time spent with patient was 30 minutes. Greater than 50% of time was spent in counseling and coordination of care.  Follow-Up Instructions: Return for Osteoarthritis, Fibromyalgia, DDD.   Ofilia Neas, PA-C   I examined and evaluated the patient with Hazel Sams PA.  Despite of multiple arthralgias she had left temporal artery tenderness on examination.  She states she has been having headaches for the last 2 weeks off and on.  She states she has been under a lot of stress because her husband has a stroke.  None of the other symptoms are new.  She has chronic arthralgias due to underlying osteoarthritis and disc disease.  I had detailed discussion with patient regarding possibility of temporal arteritis.  I have advised her to get an eye examination.  I also advised getting temporal artery biopsy but she declined.  We had detailed discussion starting her on prednisone.  Patient declined prednisone.  She states she is taking prednisone in the past and causes increased risk of infection.  She would like to wait until her repeat sed rate is available.  I also offered a second opinion from [place].  She is willing to go for second opinion.  The plan of care was discussed as noted above.  Bo Merino, MD  Note - This record has been created using Editor, commissioning.  Chart creation errors have been  sought, but may not always  have been located. Such creation errors do not reflect on  the standard of medical care.

## 2018-09-04 DIAGNOSIS — H35413 Lattice degeneration of retina, bilateral: Secondary | ICD-10-CM | POA: Diagnosis not present

## 2018-09-04 DIAGNOSIS — H2513 Age-related nuclear cataract, bilateral: Secondary | ICD-10-CM | POA: Diagnosis not present

## 2018-09-09 ENCOUNTER — Encounter: Payer: Self-pay | Admitting: Rheumatology

## 2018-09-09 ENCOUNTER — Ambulatory Visit: Payer: 59 | Admitting: Rheumatology

## 2018-09-09 VITALS — BP 124/79 | HR 58 | Resp 15 | Ht 65.0 in | Wt 231.8 lb

## 2018-09-09 DIAGNOSIS — E66813 Obesity, class 3: Secondary | ICD-10-CM

## 2018-09-09 DIAGNOSIS — M461 Sacroiliitis, not elsewhere classified: Secondary | ICD-10-CM

## 2018-09-09 DIAGNOSIS — M47818 Spondylosis without myelopathy or radiculopathy, sacral and sacrococcygeal region: Secondary | ICD-10-CM | POA: Diagnosis not present

## 2018-09-09 DIAGNOSIS — M5030 Other cervical disc degeneration, unspecified cervical region: Secondary | ICD-10-CM | POA: Diagnosis not present

## 2018-09-09 DIAGNOSIS — Z8639 Personal history of other endocrine, nutritional and metabolic disease: Secondary | ICD-10-CM

## 2018-09-09 DIAGNOSIS — Z8261 Family history of arthritis: Secondary | ICD-10-CM

## 2018-09-09 DIAGNOSIS — R700 Elevated erythrocyte sedimentation rate: Secondary | ICD-10-CM | POA: Diagnosis not present

## 2018-09-09 DIAGNOSIS — Z6841 Body Mass Index (BMI) 40.0 and over, adult: Secondary | ICD-10-CM

## 2018-09-09 DIAGNOSIS — M51369 Other intervertebral disc degeneration, lumbar region without mention of lumbar back pain or lower extremity pain: Secondary | ICD-10-CM

## 2018-09-09 DIAGNOSIS — M797 Fibromyalgia: Secondary | ICD-10-CM

## 2018-09-09 DIAGNOSIS — M170 Bilateral primary osteoarthritis of knee: Secondary | ICD-10-CM | POA: Diagnosis not present

## 2018-09-09 DIAGNOSIS — G44229 Chronic tension-type headache, not intractable: Secondary | ICD-10-CM

## 2018-09-09 DIAGNOSIS — E063 Autoimmune thyroiditis: Secondary | ICD-10-CM

## 2018-09-09 DIAGNOSIS — R5383 Other fatigue: Secondary | ICD-10-CM

## 2018-09-09 DIAGNOSIS — Z8719 Personal history of other diseases of the digestive system: Secondary | ICD-10-CM

## 2018-09-09 DIAGNOSIS — M5136 Other intervertebral disc degeneration, lumbar region: Secondary | ICD-10-CM | POA: Diagnosis not present

## 2018-09-10 ENCOUNTER — Telehealth: Payer: Self-pay | Admitting: *Deleted

## 2018-09-10 DIAGNOSIS — R700 Elevated erythrocyte sedimentation rate: Secondary | ICD-10-CM

## 2018-09-10 LAB — COMPLETE METABOLIC PANEL WITH GFR
AG Ratio: 1.1 (calc) (ref 1.0–2.5)
ALT: 17 U/L (ref 6–29)
AST: 20 U/L (ref 10–35)
Albumin: 4.3 g/dL (ref 3.6–5.1)
Alkaline phosphatase (APISO): 118 U/L (ref 33–130)
BUN: 7 mg/dL (ref 7–25)
CO2: 27 mmol/L (ref 20–32)
Calcium: 10.9 mg/dL — ABNORMAL HIGH (ref 8.6–10.4)
Chloride: 104 mmol/L (ref 98–110)
Creat: 0.86 mg/dL (ref 0.50–0.99)
GFR, Est African American: 84 mL/min/{1.73_m2} (ref >=60)
GFR, Est Non African American: 72 mL/min/{1.73_m2} (ref >=60)
Globulin: 4 g/dL (calc) — ABNORMAL HIGH (ref 1.9–3.7)
Glucose, Bld: 85 mg/dL (ref 65–99)
Potassium: 4.9 mmol/L (ref 3.5–5.3)
Sodium: 136 mmol/L (ref 135–146)
Total Bilirubin: 0.7 mg/dL (ref 0.2–1.2)
Total Protein: 8.3 g/dL — ABNORMAL HIGH (ref 6.1–8.1)

## 2018-09-10 LAB — CBC WITH DIFFERENTIAL/PLATELET
Basophils Absolute: 32 cells/uL (ref 0–200)
Basophils Relative: 0.4 %
Eosinophils Absolute: 111 cells/uL (ref 15–500)
Eosinophils Relative: 1.4 %
HCT: 39.3 % (ref 35.0–45.0)
Hemoglobin: 13.2 g/dL (ref 11.7–15.5)
Lymphs Abs: 1517 cells/uL (ref 850–3900)
MCH: 28.1 pg (ref 27.0–33.0)
MCHC: 33.6 g/dL (ref 32.0–36.0)
MCV: 83.6 fL (ref 80.0–100.0)
MPV: 12.2 fL (ref 7.5–12.5)
Monocytes Relative: 7.6 %
Neutro Abs: 5641 cells/uL (ref 1500–7800)
Neutrophils Relative %: 71.4 %
Platelets: 250 10*3/uL (ref 140–400)
RBC: 4.7 10*6/uL (ref 3.80–5.10)
RDW: 14.7 % (ref 11.0–15.0)
Total Lymphocyte: 19.2 %
WBC mixed population: 600 cells/uL (ref 200–950)
WBC: 7.9 10*3/uL (ref 3.8–10.8)

## 2018-09-10 LAB — SEDIMENTATION RATE: Sed Rate: 56 mm/h — ABNORMAL HIGH (ref 0–30)

## 2018-09-10 NOTE — Progress Notes (Signed)
I attempted to call patient to discuss lab work.  Sed rate is chronically elevated.  Discussed lab results with Dr. Estanislado Pandy. Due to her temporal artery tenderness on exam yesterday and the risk of vision loss with untreated temporal arteritis starting on prednisone is recommended.  Please ask patient if she is ok starting on prednisone.   Calcium is elevated.  Please advise her to avoid taking a calcium supplement at tis time.  CBC WNL.

## 2018-09-10 NOTE — Telephone Encounter (Signed)
-----   Message from Ofilia Neas, PA-C sent at 09/10/2018  4:29 PM EDT ----- If she is willing to have the temporal artery biopsy please place the referral ASAP.  Please advise patient to notify us when she schedules an appointment with [facility] rheumatology.

## 2018-09-10 NOTE — Progress Notes (Signed)
If she is willing to have the temporal artery biopsy please place the referral ASAP.  Please advise patient to notify us when she schedules an appointment with [facility] rheumatology.

## 2018-09-13 DIAGNOSIS — Z13820 Encounter for screening for osteoporosis: Secondary | ICD-10-CM | POA: Diagnosis not present

## 2018-09-13 DIAGNOSIS — E210 Primary hyperparathyroidism: Secondary | ICD-10-CM | POA: Diagnosis not present

## 2018-12-19 ENCOUNTER — Other Ambulatory Visit (HOSPITAL_COMMUNITY): Payer: Self-pay | Admitting: Internal Medicine

## 2018-12-19 ENCOUNTER — Other Ambulatory Visit: Payer: Self-pay | Admitting: Internal Medicine

## 2018-12-19 DIAGNOSIS — E063 Autoimmune thyroiditis: Secondary | ICD-10-CM | POA: Diagnosis not present

## 2018-12-19 DIAGNOSIS — E210 Primary hyperparathyroidism: Secondary | ICD-10-CM

## 2019-01-02 ENCOUNTER — Encounter (HOSPITAL_COMMUNITY): Payer: BLUE CROSS/BLUE SHIELD

## 2019-01-02 ENCOUNTER — Encounter (HOSPITAL_COMMUNITY): Payer: Self-pay

## 2019-01-13 ENCOUNTER — Other Ambulatory Visit: Payer: Self-pay | Admitting: Gastroenterology

## 2019-01-13 DIAGNOSIS — R1013 Epigastric pain: Secondary | ICD-10-CM | POA: Diagnosis not present

## 2019-01-13 DIAGNOSIS — D3500 Benign neoplasm of unspecified adrenal gland: Secondary | ICD-10-CM

## 2019-01-13 DIAGNOSIS — R110 Nausea: Secondary | ICD-10-CM | POA: Diagnosis not present

## 2019-01-21 ENCOUNTER — Inpatient Hospital Stay: Admission: RE | Admit: 2019-01-21 | Payer: Self-pay | Source: Ambulatory Visit

## 2019-01-30 ENCOUNTER — Other Ambulatory Visit: Payer: Self-pay

## 2019-02-04 DIAGNOSIS — M76892 Other specified enthesopathies of left lower limb, excluding foot: Secondary | ICD-10-CM | POA: Diagnosis not present

## 2019-02-04 DIAGNOSIS — M8580 Other specified disorders of bone density and structure, unspecified site: Secondary | ICD-10-CM | POA: Diagnosis not present

## 2019-02-04 DIAGNOSIS — M1612 Unilateral primary osteoarthritis, left hip: Secondary | ICD-10-CM | POA: Diagnosis not present

## 2019-02-06 ENCOUNTER — Other Ambulatory Visit: Payer: Self-pay

## 2019-03-10 ENCOUNTER — Ambulatory Visit: Payer: 59 | Admitting: Rheumatology

## 2019-07-03 DIAGNOSIS — M1612 Unilateral primary osteoarthritis, left hip: Secondary | ICD-10-CM | POA: Diagnosis not present

## 2019-07-07 NOTE — Progress Notes (Deleted)
Office Visit Note  Patient: Meghan Owen             Date of Birth: Sep 12, 1956           MRN: [id number]             PCP: Harlan Stains, MD Referring: Harlan Stains, MD Visit Date: 07/21/2019 Occupation: @GUAROCC @  Subjective:  No chief complaint on file.   History of Present Illness: Meghan Owen is a 63 y.o. female ***   Activities of Daily Living:  Patient reports morning stiffness for *** {minute/hour:19697}.   Patient {ACTIONS;DENIES/REPORTS:21021675::"Denies"} nocturnal pain.  Difficulty dressing/grooming: {ACTIONS;DENIES/REPORTS:21021675::"Denies"} Difficulty climbing stairs: {ACTIONS;DENIES/REPORTS:21021675::"Denies"} Difficulty getting out of chair: {ACTIONS;DENIES/REPORTS:21021675::"Denies"} Difficulty using hands for taps, buttons, cutlery, and/or writing: {ACTIONS;DENIES/REPORTS:21021675::"Denies"}  No Rheumatology ROS completed.   PMFS History:  Patient Active Problem List   Diagnosis Date Noted  . Osteoarthritis of both sacroiliac joints 01/04/2018  . Primary osteoarthritis of both knees 01/04/2018  . DDD (degenerative disc disease), cervical 01/04/2018  . DDD (degenerative disc disease), lumbar 01/04/2018  . Chronic right shoulder pain 01/04/2018  . Fibromyalgia 01/04/2018  . Hashimoto's thyroiditis 01/04/2018  . History of hyperparathyroidism 01/04/2018  . History of gastroesophageal reflux (GERD) 01/04/2018  . Family history of rheumatoid arthritis 01/04/2018  . Cervical myelopathy ([place]) 09/05/2016  . GERD 02/05/2009  . OTHER BURSITIS DISORDERS 02/05/2009  . ABNORMAL ELECTROCARDIOGRAM 02/05/2009    Past Medical History:  Diagnosis Date  . Arthritis   . Fibromyalgia   . GERD (gastroesophageal reflux disease)    HX  . Hashimoto's thyroiditis   . Headache   . Hypothyroidism   . Liver cyst    BENIGN  . Parathyroid disorder ([place])    HYPERPARATHYROID  . Spinal stenosis   . Thyroid disease     Family History  Problem Relation Age of Onset   . Lupus Mother   . Rheum arthritis Mother   . Dementia Father   . Leukemia Father   . Cancer Other   . Cancer Sister        bone   . Healthy Son   . Healthy Son   . Healthy Daughter    Past Surgical History:  Procedure Laterality Date  . ABDOMINAL HYSTERECTOMY    . ANKLE SURGERY    . ANTERIOR CERVICAL DECOMPRESSION/DISCECTOMY FUSION 4 LEVELS N/A 09/05/2016   Procedure: Cervical three-four Cervical four-five  Cervical five-six Cervical six-seven  Anterior cervical decompression/diskectomy/fusion;  Surgeon: Erline Levine, MD;  Location: [facility];  Service: Neurosurgery;  Laterality: N/A;  . CARPAL TUNNEL RELEASE Left 02/26/2018  . ELBOW SURGERY  04/2017   cyst removed   . FOOT SURGERY     Social History   Social History Narrative  . Not on file    There is no immunization history on file for this patient.   Objective: Vital Signs: There were no vitals taken for this visit.   Physical Exam   Musculoskeletal Exam: ***  CDAI Exam: CDAI Score: - Patient Global: -; Provider Global: - Swollen: -; Tender: - Joint Exam   No joint exam has been documented for this visit   There is currently no information documented on the homunculus. Go to the Rheumatology activity and complete the homunculus joint exam.  Investigation: No additional findings.  Imaging: No results found.  Recent Labs: Lab Results  Component Value Date   WBC 7.9 09/09/2018   HGB 13.2 09/09/2018   PLT 250 09/09/2018   NA 136 09/09/2018  K 4.9 09/09/2018   CL 104 09/09/2018   CO2 27 09/09/2018   GLUCOSE 85 09/09/2018   BUN 7 09/09/2018   CREATININE 0.86 09/09/2018   BILITOT 0.7 09/09/2018   ALKPHOS 118 10/24/2017   AST 20 09/09/2018   ALT 17 09/09/2018   PROT 8.3 (H) 09/09/2018   ALBUMIN 3.9 10/24/2017   CALCIUM 10.9 (H) 09/09/2018   GFRAA 84 09/09/2018    Speciality Comments: No specialty comments available.  Procedures:  No procedures performed Allergies: Aspirin, Other,  Contrast media [iodinated diagnostic agents], Iodine-131, Ioxaglate, Penicillins, and Prednisone   Assessment / Plan:     Visit Diagnoses: No diagnosis found.  Orders: No orders of the defined types were placed in this encounter.  No orders of the defined types were placed in this encounter.   Face-to-face time spent with patient was *** minutes. Greater than 50% of time was spent in counseling and coordination of care.  Follow-Up Instructions: No follow-ups on file.   Earnestine Mealing, CMA  Note - This record has been created using Editor, commissioning.  Chart creation errors have been sought, but may not always  have been located. Such creation errors do not reflect on  the standard of medical care.

## 2019-07-21 ENCOUNTER — Ambulatory Visit: Payer: Self-pay | Admitting: Rheumatology

## 2019-08-06 NOTE — Progress Notes (Signed)
Office Visit Note  Patient: Meghan Owen             Date of Birth: 01-22-1956           MRN: [id number]             PCP: Harlan Stains, MD Referring: Harlan Stains, MD Visit Date: 08/20/2019 Occupation: _0 @  Subjective:  Pain in multiple joints   History of Present Illness: Meghan Owen is a 63 y.o. female with history of osteoarthritis.  Patient presents today with pain in multiple joints including bilateral shoulder joints, bilateral knee joints, bilateral ankle joints.  She reports that she has a history of primary hyperparathyroidism and is concerned about the diagnosis of pseudogout.  She is followed by Dr. Buddy Duty her endocrinologist.  She has been having flares once every 1 to 2 months.  She states the flares are typically on 1 of the shoulders, 1 of the knees, or 1 of the ankle joints.  She states the flares typically last 5 to 7 days.  She states that strenuous activity typically brings the flares on.  She has not taken any over-the-counter products for pain relief or prednisone recently.  She states the most recent flare was 3 weeks ago in the right knee joint.  She states that she experienced right knee joint pain and swelling that lasted about 5 days.  She states the flares at times are so severe that she has to walk with a cane.  She continues to have bilateral SI joint pain.  She is also been experiencing increased left hip pain and was evaluated by Dr. Wynelle Link who recommended a left hip replacement.  She has been trying to lose weight in preparation for the left hip arthroplasty. Her fibromyalgia has been under control recently.     Activities of Daily Living:  Patient reports morning stiffness for 20-30 minutes.   Patient Reports nocturnal pain.  Difficulty dressing/grooming: Denies Difficulty climbing stairs: Reports Difficulty getting out of chair: Reports Difficulty using hands for taps, buttons, cutlery, and/or writing: Denies  Review of Systems    Constitutional: Positive for fatigue.  HENT: Negative for mouth sores, mouth dryness and nose dryness.   Eyes: Negative for pain, itching, visual disturbance and dryness.  Respiratory: Negative for cough, hemoptysis, shortness of breath, wheezing and difficulty breathing.   Cardiovascular: Negative for chest pain, palpitations, hypertension and swelling in legs/feet.  Gastrointestinal: Negative for abdominal pain, blood in stool, constipation and diarrhea.  Endocrine: Negative for increased urination.  Genitourinary: Negative for difficulty urinating and painful urination.  Musculoskeletal: Positive for arthralgias, joint pain, joint swelling and morning stiffness. Negative for myalgias, muscle weakness, muscle tenderness and myalgias.  Skin: Negative for pallor, rash, hair loss, nodules/bumps, skin tightness, ulcers and sensitivity to sunlight.  Allergic/Immunologic: Negative for susceptible to infections.  Neurological: Negative for numbness.  Hematological: Negative for swollen glands.  Psychiatric/Behavioral: Positive for sleep disturbance. Negative for depressed mood and confusion. The patient is not nervous/anxious.     PMFS History:  Patient Active Problem List   Diagnosis Date Noted   Osteoarthritis of both sacroiliac joints 01/04/2018   Primary osteoarthritis of both knees 01/04/2018   DDD (degenerative disc disease), cervical 01/04/2018   DDD (degenerative disc disease), lumbar 01/04/2018   Chronic right shoulder pain 01/04/2018   Fibromyalgia 01/04/2018   Hashimoto's thyroiditis 01/04/2018   History of hyperparathyroidism 01/04/2018   History of gastroesophageal reflux (GERD) 01/04/2018   Family history of rheumatoid arthritis 01/04/2018  Cervical myelopathy ([place]) 09/05/2016   GERD 02/05/2009   OTHER BURSITIS DISORDERS 02/05/2009   ABNORMAL ELECTROCARDIOGRAM 02/05/2009    Past Medical History:  Diagnosis Date   Arthritis    Fibromyalgia    GERD  (gastroesophageal reflux disease)    HX   Hashimoto's thyroiditis    Headache    Hypothyroidism    Liver cyst    BENIGN   Parathyroid disorder ([place])    HYPERPARATHYROID   Spinal stenosis    Thyroid disease     Family History  Problem Relation Age of Onset   Lupus Mother    Rheum arthritis Mother    Dementia Father    Leukemia Father    Cancer Other    Cancer Sister        bone    Healthy Son    Healthy Son    Healthy Daughter    Past Surgical History:  Procedure Laterality Date   ABDOMINAL HYSTERECTOMY     ANKLE SURGERY     ANTERIOR CERVICAL DECOMPRESSION/DISCECTOMY FUSION 4 LEVELS N/A 09/05/2016   Procedure: Cervical three-four Cervical four-five  Cervical five-six Cervical six-seven  Anterior cervical decompression/diskectomy/fusion;  Surgeon: Erline Levine, MD;  Location: [place] [facility];  Service: Neurosurgery;  Laterality: N/A;   CARPAL TUNNEL RELEASE Left 02/26/2018   ELBOW SURGERY  04/2017   cyst removed    FOOT SURGERY     Social History   Social History Narrative   Not on file    There is no immunization history on file for this patient.   Objective: Vital Signs: BP 140/82 (BP Location: Right Wrist, Patient Position: Sitting, Cuff Size: Normal)    Pulse 66    Resp 15    Ht 5' 5" (1.651 m)    Wt 257 lb 12.8 oz (116.9 kg)    BMI 42.90 kg/m    Physical Exam Vitals signs and nursing note reviewed.  Constitutional:      Appearance: She is well-developed.  HENT:     Head: Normocephalic and atraumatic.  Eyes:     Conjunctiva/sclera: Conjunctivae normal.  Neck:     Musculoskeletal: Normal range of motion.  Cardiovascular:     Rate and Rhythm: Normal rate and regular rhythm.     Heart sounds: Normal heart sounds.  Pulmonary:     Effort: Pulmonary effort is normal.     Breath sounds: Normal breath sounds.  Abdominal:     General: Bowel sounds are normal.     Palpations: Abdomen is soft.  Lymphadenopathy:     Cervical: No cervical  adenopathy.  Skin:    General: Skin is warm and dry.     Capillary Refill: Capillary refill takes less than 2 seconds.  Neurological:     Mental Status: She is alert and oriented to person, place, and time.  Psychiatric:        Behavior: Behavior normal.      Musculoskeletal Exam: C-spine limited ROM with lateral rotation.  Thoracic and lumbar spine good ROM. Tenderness of bilateral SI joints.  Shoulder joints have full ROM with no discomfort at this time. Elbow joints, wrist joints, MCPs, PIPs, and DIPs good ROM with no synovitis.  Left hip joint limited ROM with discomfort. Right hip good ROM.  Knee joints good ROM with no warmth or effusion.  Ankle joint tenderness bilaterally.  No tenderness of MTP joints.   CDAI Exam: CDAI Score: -- Patient Global: --; Provider Global: -- Swollen: --; Tender: -- Joint Exam  No joint exam has been documented for this visit   There is currently no information documented on the homunculus. Go to the Rheumatology activity and complete the homunculus joint exam.  Investigation: No additional findings.  Imaging: Xr Knee 3 View Left  Result Date: 08/20/2019 Mild lateral compartment narrowing was noted.  Severe patellofemoral narrowing was noted.  No chondrocalcinosis was noted. Impression: These findings are consistent with mild osteoarthritis and severe chondromalacia patella.  Xr Knee 3 View Right  Result Date: 08/20/2019 No medial lateral compartment narrowing was noted.  Severe patellofemoral narrowing was noted.  No chondrocalcinosis was noted. Impression: These findings are consistent with chondromalacia patella of the knee joint.  Xr Shoulder Left  Result Date: 08/20/2019 No glenohumeral or acromioclavicular joint space narrowing was noted.  No chondrocalcinosis was noted. Impression: Unremarkable x-ray of the shoulder joint.  Xr Shoulder Right  Result Date: 08/20/2019 No glenohumeral or acromioclavicular joint space narrowing was noted.   No chondrocalcinosis was noted. Impression: Unremarkable x-ray of the shoulder joint.   Recent Labs: Lab Results  Component Value Date   WBC 7.9 09/09/2018   HGB 13.2 09/09/2018   PLT 250 09/09/2018   NA 136 09/09/2018   K 4.9 09/09/2018   CL 104 09/09/2018   CO2 27 09/09/2018   GLUCOSE 85 09/09/2018   BUN 7 09/09/2018   CREATININE 0.86 09/09/2018   BILITOT 0.7 09/09/2018   ALKPHOS 118 10/24/2017   AST 20 09/09/2018   ALT 17 09/09/2018   PROT 8.3 (H) 09/09/2018   ALBUMIN 3.9 10/24/2017   CALCIUM 10.9 (H) 09/09/2018   GFRAA 84 09/09/2018    Speciality Comments: No specialty comments available.  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  The flares have been lasting 5-7 days and resolve on their own without prednisone or OTC products for pain relief.  She has no obvious synovitis on exam today.  She has been experiencing intermittent pain in bilateral shoulder joints, bilateral knee joints, bilateral ankle joints.  No warmth or effusion of the knee joints were noted on exam today.  She does have tenderness of bilateral ankle joints but no inflammation was noted.  She has good range of motion of bilateral shoulders with no tenderness or effusion.  The most recent flare was in the right knee joint 3 weeks ago which lasted about 5 days.  She states that at times the flares are so severe she has to use a cane to assist with ambulation.  She has a known family history of rheumatoid arthritis.  We will obtain the following lab work today including RF, CCP, _0 eta, and sed rate.  X-rays of both shoulders were unremarkable and knee joints revealed no chondrocalcinosis.  She does have  chondromalacia patella bilaterally.  We will start her on a trial of Plaquenil if lab work is stable.  CBC and CMP will be drawn today.  She will follow up in 1 month.  High risk medication use - CBC and CMP will be drawn today.  If labs are stable, a prescription for PLQ will be sent to the pharmacy. Plan: COMPLETE METABOLIC PANEL WITH GFR, CBC with Differential/Platelet  Osteoarthritis of both sacroiliac joints - HLA-B27 negative, MRI negative for sacroiliitis: She has  tenderness of bilateral SI joints.  MRI on 01/01/2018 revealed chronic fusion of superior aspect of right SI joint.  Family history of rheumatoid arthritis  Chronic pain of both shoulders -she has been experiencing intermittent pain in bilateral shoulder joints.  She has good range of motion of bilateral shoulders with no discomfort on exam today.  No tenderness or effusion was noted.  X-rays of both shoulders were obtained.  X-rays were unremarkable.  The following lab work will be drawn today. plan: XR Shoulder Left, XR Shoulder Right, 14-3-3 eta Protein, Cyclic citrul peptide antibody, IgG, Rheumatoid factor, Angiotensin converting enzyme, Uric acid, Magnesium, Sedimentation rate  Chronic pain of both knees - Plan: XR KNEE 3 VIEW LEFT, XR KNEE 3 VIEW RIGHT, 14-3-3 eta Protein, Cyclic citrul peptide antibody, IgG, Rheumatoid factor, Angiotensin converting enzyme, Uric acid, Magnesium, Sedimentation rate  Elevated sed rate -History of elevated sed rate.  Sed rate 09/09/2018 was 56.  Sed rate will be checked today.  Plan: 14-3-3 eta Protein, Cyclic citrul peptide antibody, IgG, Rheumatoid factor, Angiotensin converting enzyme, Uric acid, Magnesium, Sedimentation rate  Primary osteoarthritis of both knees - Left knee-mild OA with severe chrondomalacia patella.  Right knee- no OA, chondromalacia patella noted.  No chondrocalcinosis noted.  She has good ROM with no discomfort.  No warmth or effusion noted today.  She has been having  increased pain and inflammation that lasts about 5 to 7 days every 1 to 2 months.  She states she previously would have flares about once every 5 months.  She states the flares are so severe at times that she has to use a cane to assist with ambulation.  DDD (degenerative disc disease), cervical - S/p fusion:  She has limited ROM of the C-spine.  She has no symptoms of radiculopathy at this time.   DDD (degenerative disc disease), lumbar: She has no lower back pain at this time.   Fibromyalgia: Her fibromyalgia has been well controlled recently. She continues to have chronic fatigue.   Other fatigue: Chronic   Hashimoto's thyroiditis  History of hyperparathyroidism - She is followed by Dr. Buddy Duty.  Plan: Magnesium  Other medical conditions are listed as follows:   History of gastroesophageal reflux (GERD)  Class 3 severe obesity due to excess calories without serious comorbidity with body mass index (BMI) of 40.0 to 44.9 in adult [facility])    Orders: Orders Placed This Encounter  Procedures   XR KNEE 3 VIEW LEFT   XR KNEE 3 VIEW RIGHT   XR Shoulder Left   XR Shoulder Right   14-3-3 eta Protein   Cyclic citrul peptide antibody, IgG   Rheumatoid factor   Angiotensin converting enzyme   Uric acid   Magnesium   Sedimentation rate   COMPLETE METABOLIC PANEL WITH GFR   CBC with Differential/Platelet   No orders of the defined types were placed in this encounter.   Face-to-face time spent with patient was 30 minutes. Greater than 50% of time was spent in counseling and coordination of care.  Follow-Up Instructions: Return in about 4 weeks (around 09/17/2019) for Osteoarthritis, Fibromyalgia.   Ofilia Neas, PA-C   I examined and evaluated the patient with Hazel Sams PA.  Patient has been having episodic increased joint pain and swelling.  She states the episodes have become more frequent and happening almost every month now.  Last episode was 3 months ago.  There is  family history of rheumatoid arthritis.  All autoimmune work-up was negative in the  past.  I am suspecting palindromic rheumatism.  Although she did not have any synovitis on examination we discussed possible use of Plaquenil.  Indications side effects contraindications were discussed at length.  She wants to proceed with Plaquenil.  We will call in the prescription after the labs are available.  The plan of care was discussed as noted above.  Bo Merino, MD  Note - This record has been created using Editor, commissioning.  Chart creation errors have been sought, but may not always  have been located. Such creation errors do not reflect on  the standard of medical care.

## 2019-08-20 ENCOUNTER — Ambulatory Visit: Payer: BLUE CROSS/BLUE SHIELD | Admitting: Rheumatology

## 2019-08-20 ENCOUNTER — Ambulatory Visit (INDEPENDENT_AMBULATORY_CARE_PROVIDER_SITE_OTHER): Payer: BC Managed Care – PPO

## 2019-08-20 ENCOUNTER — Encounter: Payer: Self-pay | Admitting: Physician Assistant

## 2019-08-20 ENCOUNTER — Telehealth: Payer: Self-pay | Admitting: Pharmacist

## 2019-08-20 ENCOUNTER — Ambulatory Visit: Payer: Self-pay

## 2019-08-20 ENCOUNTER — Other Ambulatory Visit: Payer: Self-pay

## 2019-08-20 VITALS — BP 140/82 | HR 66 | Resp 15 | Ht 65.0 in | Wt 257.8 lb

## 2019-08-20 DIAGNOSIS — Z8719 Personal history of other diseases of the digestive system: Secondary | ICD-10-CM

## 2019-08-20 DIAGNOSIS — M25511 Pain in right shoulder: Secondary | ICD-10-CM

## 2019-08-20 DIAGNOSIS — M5030 Other cervical disc degeneration, unspecified cervical region: Secondary | ICD-10-CM | POA: Diagnosis not present

## 2019-08-20 DIAGNOSIS — M25561 Pain in right knee: Secondary | ICD-10-CM

## 2019-08-20 DIAGNOSIS — M25512 Pain in left shoulder: Secondary | ICD-10-CM

## 2019-08-20 DIAGNOSIS — R700 Elevated erythrocyte sedimentation rate: Secondary | ICD-10-CM

## 2019-08-20 DIAGNOSIS — M797 Fibromyalgia: Secondary | ICD-10-CM

## 2019-08-20 DIAGNOSIS — M1230 Palindromic rheumatism, unspecified site: Secondary | ICD-10-CM

## 2019-08-20 DIAGNOSIS — M5136 Other intervertebral disc degeneration, lumbar region: Secondary | ICD-10-CM

## 2019-08-20 DIAGNOSIS — M170 Bilateral primary osteoarthritis of knee: Secondary | ICD-10-CM

## 2019-08-20 DIAGNOSIS — Z79899 Other long term (current) drug therapy: Secondary | ICD-10-CM

## 2019-08-20 DIAGNOSIS — M25562 Pain in left knee: Secondary | ICD-10-CM

## 2019-08-20 DIAGNOSIS — G8929 Other chronic pain: Secondary | ICD-10-CM

## 2019-08-20 DIAGNOSIS — Z8639 Personal history of other endocrine, nutritional and metabolic disease: Secondary | ICD-10-CM

## 2019-08-20 DIAGNOSIS — R5383 Other fatigue: Secondary | ICD-10-CM

## 2019-08-20 DIAGNOSIS — E063 Autoimmune thyroiditis: Secondary | ICD-10-CM

## 2019-08-20 DIAGNOSIS — M461 Sacroiliitis, not elsewhere classified: Secondary | ICD-10-CM

## 2019-08-20 DIAGNOSIS — M47818 Spondylosis without myelopathy or radiculopathy, sacral and sacrococcygeal region: Secondary | ICD-10-CM | POA: Diagnosis not present

## 2019-08-20 DIAGNOSIS — Z8261 Family history of arthritis: Secondary | ICD-10-CM

## 2019-08-20 DIAGNOSIS — Z6841 Body Mass Index (BMI) 40.0 and over, adult: Secondary | ICD-10-CM

## 2019-08-20 NOTE — Progress Notes (Signed)
Pharmacy Note  Subjective: Patient presents today to the [facility] to see Dr. Estanislado Pandy.  Patient seen by the pharmacist for counseling on hydroxychloroquine.  Objective: CMP     Component Value Date/Time   NA 136 09/09/2018 1230   K 4.9 09/09/2018 1230   CL 104 09/09/2018 1230   CO2 27 09/09/2018 1230   GLUCOSE 85 09/09/2018 1230   BUN 7 09/09/2018 1230   CREATININE 0.86 09/09/2018 1230   CALCIUM 10.9 (H) 09/09/2018 1230   PROT 8.3 (H) 09/09/2018 1230   ALBUMIN 3.9 10/24/2017 1205   AST 20 09/09/2018 1230   ALT 17 09/09/2018 1230   ALKPHOS 118 10/24/2017 1205   BILITOT 0.7 09/09/2018 1230   GFRNONAA 72 09/09/2018 1230   GFRAA 84 09/09/2018 1230    CBC    Component Value Date/Time   WBC 7.9 09/09/2018 1230   RBC 4.70 09/09/2018 1230   HGB 13.2 09/09/2018 1230   HCT 39.3 09/09/2018 1230   PLT 250 09/09/2018 1230   MCV 83.6 09/09/2018 1230   MCH 28.1 09/09/2018 1230   MCHC 33.6 09/09/2018 1230   RDW 14.7 09/09/2018 1230   LYMPHSABS 1,517 09/09/2018 1230   MONOABS 0.4 10/24/2017 1205   EOSABS 111 09/09/2018 1230   BASOSABS 32 09/09/2018 1230    Assessment/Plan: Patient was counseled on the purpose, proper use, and adverse effects of hydroxychloroquine including nausea/diarrhea, skin rash, headaches, and sun sensitivity.  Discussed importance of annual eye exams while on hydroxychloroquine to monitor to ocular toxicity and discussed importance of frequent laboratory monitoring.  Provided patient with eye exam form for baseline ophthalmologic exam and standing lab instructions.  Provided patient with educational materials on hydroxychloroquine and answered all questions.  Patient consented to hydroxychloroquine.  Will upload consent in the media tab.     Dose will be Plaquenil 200 mg twice daily based on weight 116.9, height 5'5", and most recent eGFR 72.  Prescription pending lab results.  All questions encouraged and answered.  Mariella Saa, PharmD,  [place], [place] Clinical Specialty Pharmacist [phone]  08/20/2019 12:13 PM

## 2019-08-20 NOTE — Telephone Encounter (Signed)
Dose will be Plaquenil 200 mg twice daily based on weight 116.9, height 5'5", and most recent eGFR 72.  Prescription pending lab results.

## 2019-08-25 DIAGNOSIS — R202 Paresthesia of skin: Secondary | ICD-10-CM | POA: Diagnosis not present

## 2019-08-25 DIAGNOSIS — G629 Polyneuropathy, unspecified: Secondary | ICD-10-CM | POA: Diagnosis not present

## 2019-08-25 DIAGNOSIS — E210 Primary hyperparathyroidism: Secondary | ICD-10-CM | POA: Diagnosis not present

## 2019-08-25 LAB — COMPLETE METABOLIC PANEL WITH GFR
AG Ratio: 1.1 (calc) (ref 1.0–2.5)
ALT: 13 U/L (ref 6–29)
AST: 16 U/L (ref 10–35)
Albumin: 4.2 g/dL (ref 3.6–5.1)
Alkaline phosphatase (APISO): 118 U/L (ref 37–153)
BUN: 9 mg/dL (ref 7–25)
CO2: 26 mmol/L (ref 20–32)
Calcium: 10.5 mg/dL — ABNORMAL HIGH (ref 8.6–10.4)
Chloride: 105 mmol/L (ref 98–110)
Creat: 0.73 mg/dL (ref 0.50–0.99)
GFR, Est African American: 102 mL/min/{1.73_m2} (ref >=60)
GFR, Est Non African American: 88 mL/min/{1.73_m2} (ref >=60)
Globulin: 3.8 g/dL (calc) — ABNORMAL HIGH (ref 1.9–3.7)
Glucose, Bld: 97 mg/dL (ref 65–99)
Potassium: 4.5 mmol/L (ref 3.5–5.3)
Sodium: 137 mmol/L (ref 135–146)
Total Bilirubin: 0.4 mg/dL (ref 0.2–1.2)
Total Protein: 8 g/dL (ref 6.1–8.1)

## 2019-08-25 LAB — CYCLIC CITRUL PEPTIDE ANTIBODY, IGG: Cyclic Citrullin Peptide Ab: <16 UNITS

## 2019-08-25 LAB — CBC WITH DIFFERENTIAL/PLATELET
Absolute Monocytes: 468 cells/uL (ref 200–950)
Basophils Absolute: 43 cells/uL (ref 0–200)
Basophils Relative: 0.6 %
Eosinophils Absolute: 137 cells/uL (ref 15–500)
Eosinophils Relative: 1.9 %
HCT: 39 % (ref 35.0–45.0)
Hemoglobin: 12.8 g/dL (ref 11.7–15.5)
Lymphs Abs: 1490 cells/uL (ref 850–3900)
MCH: 28.2 pg (ref 27.0–33.0)
MCHC: 32.8 g/dL (ref 32.0–36.0)
MCV: 85.9 fL (ref 80.0–100.0)
MPV: 12.3 fL (ref 7.5–12.5)
Monocytes Relative: 6.5 %
Neutro Abs: 5062 cells/uL (ref 1500–7800)
Neutrophils Relative %: 70.3 %
Platelets: 231 10*3/uL (ref 140–400)
RBC: 4.54 10*6/uL (ref 3.80–5.10)
RDW: 14 % (ref 11.0–15.0)
Total Lymphocyte: 20.7 %
WBC: 7.2 10*3/uL (ref 3.8–10.8)

## 2019-08-25 LAB — 14-3-3 ETA PROTEIN: 14-3-3 eta Protein: <0.2 ng/mL (ref <0.2)

## 2019-08-25 LAB — MAGNESIUM: Magnesium: 2 mg/dL (ref 1.5–2.5)

## 2019-08-25 LAB — SEDIMENTATION RATE: Sed Rate: 86 mm/h — ABNORMAL HIGH (ref 0–30)

## 2019-08-25 LAB — URIC ACID: Uric Acid, Serum: 6.2 mg/dL (ref 2.5–7.0)

## 2019-08-25 LAB — RHEUMATOID FACTOR: Rhuematoid fact SerPl-aCnc: <14 IU/mL (ref <14)

## 2019-08-25 LAB — ANGIOTENSIN CONVERTING ENZYME: Angiotensin-Converting Enzyme: 13 U/L (ref 9–67)

## 2019-08-25 NOTE — Progress Notes (Signed)
Sed rate is elevated and trending up.  Calcium is elevated.  Please advise patient to avoid taking calcium supplement at this time.  Please notify patient and Dr. Estanislado Pandy would like to order a total body bone scan for further evaluation.   14-3-3 eta negative, CCP negative, RF negative, magnesium WNL, Ace WNL.  Uric acid WNL.  CBC WNL

## 2019-08-27 ENCOUNTER — Telehealth: Payer: Self-pay | Admitting: *Deleted

## 2019-08-27 MED ORDER — HYDROXYCHLOROQUINE SULFATE 200 MG PO TABS: 200.0000 mg | ORAL_TABLET | Freq: Two times a day (BID) | ORAL | 2 refills | Status: DC

## 2019-08-27 NOTE — Telephone Encounter (Signed)
-----   Message from Ofilia Neas, PA-C sent at 08/25/2019 12:41 PM EDT ----- Sed rate is elevated and trending up.  Calcium is elevated.  Please advise patient to avoid taking calcium supplement at this time.  Please notify patient and Dr. Estanislado Pandy would like to order a total body bone scan for further evaluation.   14-3-3 eta negative, CCP negative, RF negative, magnesium WNL, Ace WNL.  Uric acid WNL.  CBC WNL

## 2019-08-27 NOTE — Telephone Encounter (Signed)
Patient advised of results and prescription sent to the pharmacy.

## 2019-09-03 NOTE — Progress Notes (Deleted)
Office Visit Note  Patient: Meghan Owen             Date of Birth: 1956-10-04           MRN: [id number]             PCP: Harlan Stains, MD Referring: Harlan Stains, MD Visit Date: 09/17/2019 Occupation: @GUAROCC @  Subjective:  No chief complaint on file.  Plaquenil 200 mg 1 tablet twice daily started in September 2020.  No baseline Plaquenil eye exam on file.  Due for CBC/CMP today to monitor for drug toxicity.  History of Present Illness: Meghan Owen is a 63 y.o. female ***   Activities of Daily Living:  Patient reports morning stiffness for *** {minute/hour:19697}.   Patient {ACTIONS;DENIES/REPORTS:21021675::"Denies"} nocturnal pain.  Difficulty dressing/grooming: {ACTIONS;DENIES/REPORTS:21021675::"Denies"} Difficulty climbing stairs: {ACTIONS;DENIES/REPORTS:21021675::"Denies"} Difficulty getting out of chair: {ACTIONS;DENIES/REPORTS:21021675::"Denies"} Difficulty using hands for taps, buttons, cutlery, and/or writing: {ACTIONS;DENIES/REPORTS:21021675::"Denies"}  No Rheumatology ROS completed.   PMFS History:  Patient Active Problem List   Diagnosis Date Noted   Osteoarthritis of both sacroiliac joints 01/04/2018   Primary osteoarthritis of both knees 01/04/2018   DDD (degenerative disc disease), cervical 01/04/2018   DDD (degenerative disc disease), lumbar 01/04/2018   Chronic right shoulder pain 01/04/2018   Fibromyalgia 01/04/2018   Hashimoto's thyroiditis 01/04/2018   History of hyperparathyroidism 01/04/2018   History of gastroesophageal reflux (GERD) 01/04/2018   Family history of rheumatoid arthritis 01/04/2018   Cervical myelopathy ([place]) 09/05/2016   GERD 02/05/2009   OTHER BURSITIS DISORDERS 02/05/2009   ABNORMAL ELECTROCARDIOGRAM 02/05/2009    Past Medical History:  Diagnosis Date   Arthritis    Fibromyalgia    GERD (gastroesophageal reflux disease)    HX   Hashimoto's thyroiditis    Headache    Hypothyroidism    Liver  cyst    BENIGN   Parathyroid disorder ([place])    HYPERPARATHYROID   Spinal stenosis    Thyroid disease     Family History  Problem Relation Age of Onset   Lupus Mother    Rheum arthritis Mother    Dementia Father    Leukemia Father    Cancer Other    Cancer Sister        bone    Healthy Son    Healthy Son    Healthy Daughter    Past Surgical History:  Procedure Laterality Date   ABDOMINAL HYSTERECTOMY     ANKLE SURGERY     ANTERIOR CERVICAL DECOMPRESSION/DISCECTOMY FUSION 4 LEVELS N/A 09/05/2016   Procedure: Cervical three-four Cervical four-five  Cervical five-six Cervical six-seven  Anterior cervical decompression/diskectomy/fusion;  Surgeon: Erline Levine, MD;  Location: [place] [facility];  Service: Neurosurgery;  Laterality: N/A;   CARPAL TUNNEL RELEASE Left 02/26/2018   ELBOW SURGERY  04/2017   cyst removed    FOOT SURGERY     Social History   Social History Narrative   Not on file    There is no immunization history on file for this patient.   Objective: Vital Signs: There were no vitals taken for this visit.   Physical Exam   Musculoskeletal Exam: ***  CDAI Exam: CDAI Score: -- Patient Global: --; Provider Global: -- Swollen: --; Tender: -- Joint Exam   No joint exam has been documented for this visit   There is currently no information documented on the homunculus. Go to the Rheumatology activity and complete the homunculus joint exam.  Investigation: No additional findings.  Imaging: Xr Knee 3 View Left  Result Date: 08/20/2019 Mild lateral compartment narrowing was noted.  Severe patellofemoral narrowing was noted.  No chondrocalcinosis was noted. Impression: These findings are consistent with mild osteoarthritis and severe chondromalacia patella.  Xr Knee 3 View Right  Result Date: 08/20/2019 No medial lateral compartment narrowing was noted.  Severe patellofemoral narrowing was noted.  No chondrocalcinosis was noted. Impression:  These findings are consistent with chondromalacia patella of the knee joint.  Xr Shoulder Left  Result Date: 08/20/2019 No glenohumeral or acromioclavicular joint space narrowing was noted.  No chondrocalcinosis was noted. Impression: Unremarkable x-ray of the shoulder joint.  Xr Shoulder Right  Result Date: 08/20/2019 No glenohumeral or acromioclavicular joint space narrowing was noted.  No chondrocalcinosis was noted. Impression: Unremarkable x-ray of the shoulder joint.   Recent Labs: Lab Results  Component Value Date   WBC 7.2 08/20/2019   HGB 12.8 08/20/2019   PLT 231 08/20/2019   NA 137 08/20/2019   K 4.5 08/20/2019   CL 105 08/20/2019   CO2 26 08/20/2019   GLUCOSE 97 08/20/2019   BUN 9 08/20/2019   CREATININE 0.73 08/20/2019   BILITOT 0.4 08/20/2019   ALKPHOS 118 10/24/2017   AST 16 08/20/2019   ALT 13 08/20/2019   PROT 8.0 08/20/2019   ALBUMIN 3.9 10/24/2017   CALCIUM 10.5 (H) 08/20/2019   GFRAA 102 08/20/2019    Speciality Comments: No specialty comments available.  Procedures:  No procedures performed Allergies: Aspirin, Other, Contrast media [iodinated diagnostic agents], Iodine-131, Ioxaglate, Penicillins, and Prednisone   Assessment / Plan:     Visit Diagnoses: No diagnosis found.  Orders: No orders of the defined types were placed in this encounter.  No orders of the defined types were placed in this encounter.   Face-to-face time spent with patient was *** minutes. Greater than 50% of time was spent in counseling and coordination of care.  Follow-Up Instructions: No follow-ups on file.   Earnestine Mealing, CMA  Note - This record has been created using Editor, commissioning.  Chart creation errors have been sought, but may not always  have been located. Such creation errors do not reflect on  the standard of medical care.

## 2019-09-17 ENCOUNTER — Ambulatory Visit: Payer: BC Managed Care – PPO | Admitting: Rheumatology

## 2020-02-04 DIAGNOSIS — E210 Primary hyperparathyroidism: Secondary | ICD-10-CM | POA: Diagnosis not present

## 2020-02-04 DIAGNOSIS — M791 Myalgia, unspecified site: Secondary | ICD-10-CM | POA: Diagnosis not present

## 2020-02-04 DIAGNOSIS — R202 Paresthesia of skin: Secondary | ICD-10-CM | POA: Diagnosis not present

## 2020-02-04 DIAGNOSIS — R7303 Prediabetes: Secondary | ICD-10-CM | POA: Diagnosis not present

## 2020-02-04 DIAGNOSIS — E063 Autoimmune thyroiditis: Secondary | ICD-10-CM | POA: Diagnosis not present

## 2020-02-23 DIAGNOSIS — R7303 Prediabetes: Secondary | ICD-10-CM | POA: Diagnosis not present

## 2020-02-23 DIAGNOSIS — E210 Primary hyperparathyroidism: Secondary | ICD-10-CM | POA: Diagnosis not present

## 2020-02-23 DIAGNOSIS — E063 Autoimmune thyroiditis: Secondary | ICD-10-CM | POA: Diagnosis not present

## 2020-03-02 ENCOUNTER — Ambulatory Visit (INDEPENDENT_AMBULATORY_CARE_PROVIDER_SITE_OTHER): Payer: BC Managed Care – PPO

## 2020-03-02 ENCOUNTER — Encounter: Payer: Self-pay | Admitting: Sports Medicine

## 2020-03-02 ENCOUNTER — Other Ambulatory Visit: Payer: Self-pay | Admitting: Sports Medicine

## 2020-03-02 ENCOUNTER — Ambulatory Visit: Payer: BC Managed Care – PPO | Admitting: Sports Medicine

## 2020-03-02 ENCOUNTER — Other Ambulatory Visit: Payer: Self-pay

## 2020-03-02 VITALS — BP 132/69 | HR 67 | Temp 97.2°F | Resp 16

## 2020-03-02 DIAGNOSIS — M79671 Pain in right foot: Secondary | ICD-10-CM

## 2020-03-02 DIAGNOSIS — M19079 Primary osteoarthritis, unspecified ankle and foot: Secondary | ICD-10-CM | POA: Diagnosis not present

## 2020-03-02 DIAGNOSIS — M7751 Other enthesopathy of right foot: Secondary | ICD-10-CM

## 2020-03-02 DIAGNOSIS — M779 Enthesopathy, unspecified: Secondary | ICD-10-CM

## 2020-03-02 DIAGNOSIS — M7671 Peroneal tendinitis, right leg: Secondary | ICD-10-CM

## 2020-03-02 NOTE — Progress Notes (Signed)
Subjective: Meghan Owen is a 64 y.o. female patient who presents to office for evaluation of right foot pain. Patient complains of progressive pain especially over the last 4 months in the right foot reports that the pain flares up so bad that she can barely walk reports that she has difficulty when she is doing a lot of cleaning around the house with sometimes she cannot put pressure on it pain is off and on has tried elevation without any complete relief but does state that resting does help.  Patient also admits a separate incident where she dropped a can of food on top of her right foot about a month ago and reports that she had a swollen and bruised area originally but now that has receded but she still has pain in the foot which she has had prior patient also admits to a history of previous foot surgery in 2013 where she had a torn tendon and had to have a screw placed in her heel bone of her right foot.  Denies recent injury/trip/fall/sprain/any other causative factors.   Patient admits to a history of rheumatoid arthritis was on Plaquenil in the past by rheumatologist  Patient also admits to a history of significant hip and lumbar and spine arthritis with the left leg being slightly longer gas compared to the right.  Review of Systems  All other systems reviewed and are negative.    Patient Active Problem List   Diagnosis Date Noted  . Osteoarthritis of both sacroiliac joints 01/04/2018  . Primary osteoarthritis of both knees 01/04/2018  . DDD (degenerative disc disease), cervical 01/04/2018  . DDD (degenerative disc disease), lumbar 01/04/2018  . Chronic right shoulder pain 01/04/2018  . Fibromyalgia 01/04/2018  . Hashimoto's thyroiditis 01/04/2018  . History of hyperparathyroidism 01/04/2018  . History of gastroesophageal reflux (GERD) 01/04/2018  . Family history of rheumatoid arthritis 01/04/2018  . Cervical myelopathy ([place]) 09/05/2016  . Primary osteoarthritis of left hip  01/04/2016  . Chronic left hip pain 12/15/2014  . Hypercalcemia 09/08/2013  . Varus deformity of foot 08/26/2013  . Peroneal tendonitis 05/23/2013  . Conversion disorder 05/21/2013  . Low back pain 11/01/2012  . Achilles tendinitis 09/05/2012  . Bilateral ankle pain 09/05/2012  . Hammer toe, acquired 09/05/2012  . Cubital tunnel syndrome on left 08/22/2012  . Stress fracture 07/04/2012  . Lumbar facet arthropathy 05/10/2012  . Diffuse myofascial pain syndrome 01/25/2012  . GERD 02/05/2009  . OTHER BURSITIS DISORDERS 02/05/2009  . ABNORMAL ELECTROCARDIOGRAM 02/05/2009    Current Outpatient Medications on File Prior to Visit  Medication Sig Dispense Refill  . alendronate (FOSAMAX) 70 MG tablet TAKE 1 TABLET 30 MINUTESHBEFORE THE FIRST FOOD, BEVERAGE, OR MEDICINE OF THE DAY WITH WATER WEEKLY.    Marland Kitchen Ascorbic Acid (VITAMIN C PO) Take 1 tablet by mouth daily.    Marland Kitchen b complex vitamins tablet Take 1 tablet by mouth daily.    . Cholecalciferol (VITAMIN D3) 5000 UNITS CAPS Take 5,000 Units by mouth daily.     . cyanocobalamin 1000 MCG tablet Take by mouth.    Marland Kitchen MAGNESIUM PO Take 500 mg by mouth daily.     . Multiple Vitamin (MULTIVITAMIN WITH MINERALS) TABS Take 1 tablet by mouth daily. [facility] O[facility]    . Probiotic Product (PROBIOTIC PO) Take 1 tablet by mouth daily.    . TURMERIC PO Take 1,500 mg by mouth 3 (three) times daily.     . vitamin E 1000 UNIT capsule Take by mouth.  No current facility-administered medications on file prior to visit.    Allergies  Allergen Reactions  . Aspirin Palpitations and Other (See Comments)    Aspirin with caffeine only. Causes tachycardia  . Other   . Contrast Media [Iodinated Diagnostic Agents] Other (See Comments)    Hot flashes   . Iodine-131 Other (See Comments)    flushing  . Ioxaglate Other (See Comments)    Hot flashes   . Penicillins Other (See Comments)    Makes have more infections  Has patient had a PCN reaction causing immediate  rash, facial/tongue/throat swelling, SOB or lightheadedness with hypotension: No Has patient had a PCN reaction causing severe rash involving mucus membranes or skin necrosis: No Has patient had a PCN reaction that required hospitalization: No Has patient had a PCN reaction occurring within the last 10 years:No If all of the above answers are "NO", then may proceed with Cephalosporin use.   . Prednisone Other (See Comments)    Repeats infections    Objective:  General: Alert and oriented x3 in no acute distress  Dermatology: All surgical scars well-healed.  No open lesions bilateral lower extremities, no webspace macerations, no ecchymosis bilateral, all nails x 10 are well manicured.  Vascular: Dorsalis Pedis and Posterior Tibial pedal pulses palpable, Capillary Fill Time 3 seconds,(+) scant pedal hair growth bilateral, no edema bilateral lower extremities, Temperature gradient within normal limits.  Neurology: Johney Maine sensation intact via light touch bilateral.  Musculoskeletal: Mild tenderness with palpation at dorsal midfoot on the right.  Pes planus foot type.  Strength within normal limits in all groups bilateral with mild guarding on right due to pain.   Gait: Antalgic gait  Xrays  Right foot   Impression: Diffuse arthritis at midfoot, there is intact hardware at the first metatarsal and calcaneus history of previous surgery.  There is also arthritis located at the ankle subtalar joint as well as the first metatarsophalangeal joint.  Midtarsal breech supportive of pes planus.  No other acute findings.  Assessment and Plan: Problem List Items Addressed This Visit    None    Visit Diagnoses    Right foot pain    -  Primary   Arthritis of foot       Capsulitis           -Complete examination performed -Xrays reviewed -Discussed treatment options for pain related to likely arthritis versus capsulitis of right foot -Patient declined steroid injection at this time -Rx CAM  Walker and advised patient to use daily until symptoms improve for at minimum of 2 weeks -Advised patient to get over-the-counter topical Voltaren to use as needed to the top of the right foot to help with pain -Advise rest ice elevation as needed -Patient to return to office in 4 to 5 weeks or sooner if condition worsens.  Advised patient if pain is no better to consider injection versus MRI to further evaluate right foot to rule out any type of ligamentous involvement since she has had a problem with ligament tears in the past that was only discovered by MRI.  Landis Martins, DPM

## 2020-03-08 NOTE — Progress Notes (Deleted)
Office Visit Note  Patient: Meghan Owen             Date of Birth: 1955/12/27           MRN: [id number]             PCP: Harlan Stains, MD Referring: Harlan Stains, MD Visit Date: 03/15/2020 Occupation: @GUAROCC @  Subjective:  No chief complaint on file.   History of Present Illness: Meghan Owen is a 64 y.o. female ***   Activities of Daily Living:  Patient reports morning stiffness for *** {minute/hour:19697}.   Patient {ACTIONS;DENIES/REPORTS:21021675::"Denies"} nocturnal pain.  Difficulty dressing/grooming: {ACTIONS;DENIES/REPORTS:21021675::"Denies"} Difficulty climbing stairs: {ACTIONS;DENIES/REPORTS:21021675::"Denies"} Difficulty getting out of chair: {ACTIONS;DENIES/REPORTS:21021675::"Denies"} Difficulty using hands for taps, buttons, cutlery, and/or writing: {ACTIONS;DENIES/REPORTS:21021675::"Denies"}  No Rheumatology ROS completed.   PMFS History:  Patient Active Problem List   Diagnosis Date Noted  . Osteoarthritis of both sacroiliac joints 01/04/2018  . Primary osteoarthritis of both knees 01/04/2018  . DDD (degenerative disc disease), cervical 01/04/2018  . DDD (degenerative disc disease), lumbar 01/04/2018  . Chronic right shoulder pain 01/04/2018  . Fibromyalgia 01/04/2018  . Hashimoto's thyroiditis 01/04/2018  . History of hyperparathyroidism 01/04/2018  . History of gastroesophageal reflux (GERD) 01/04/2018  . Family history of rheumatoid arthritis 01/04/2018  . Cervical myelopathy ([place]) 09/05/2016  . Primary osteoarthritis of left hip 01/04/2016  . Chronic left hip pain 12/15/2014  . Hypercalcemia 09/08/2013  . Varus deformity of foot 08/26/2013  . Peroneal tendonitis 05/23/2013  . Conversion disorder 05/21/2013  . Low back pain 11/01/2012  . Achilles tendinitis 09/05/2012  . Bilateral ankle pain 09/05/2012  . Hammer toe, acquired 09/05/2012  . Cubital tunnel syndrome on left 08/22/2012  . Stress fracture 07/04/2012  . Lumbar facet  arthropathy 05/10/2012  . Diffuse myofascial pain syndrome 01/25/2012  . GERD 02/05/2009  . OTHER BURSITIS DISORDERS 02/05/2009  . ABNORMAL ELECTROCARDIOGRAM 02/05/2009    Past Medical History:  Diagnosis Date  . Arthritis   . Fibromyalgia   . GERD (gastroesophageal reflux disease)    HX  . Hashimoto's thyroiditis   . Headache   . Hypothyroidism   . Liver cyst    BENIGN  . Parathyroid disorder ([place])    HYPERPARATHYROID  . Spinal stenosis   . Thyroid disease     Family History  Problem Relation Age of Onset  . Lupus Mother   . Rheum arthritis Mother   . Dementia Father   . Leukemia Father   . Cancer Other   . Cancer Sister        bone   . Healthy Son   . Healthy Son   . Healthy Daughter    Past Surgical History:  Procedure Laterality Date  . ABDOMINAL HYSTERECTOMY    . ANKLE SURGERY    . ANTERIOR CERVICAL DECOMPRESSION/DISCECTOMY FUSION 4 LEVELS N/A 09/05/2016   Procedure: Cervical three-four Cervical four-five  Cervical five-six Cervical six-seven  Anterior cervical decompression/diskectomy/fusion;  Surgeon: Erline Levine, MD;  Location: [facility];  Service: Neurosurgery;  Laterality: N/A;  . CARPAL TUNNEL RELEASE Left 02/26/2018  . ELBOW SURGERY  04/2017   cyst removed   . FOOT SURGERY     Social History   Social History Narrative  . Not on file    There is no immunization history on file for this patient.   Objective: Vital Signs: There were no vitals taken for this visit.   Physical Exam   Musculoskeletal Exam: ***  CDAI Exam: CDAI Score: -- Patient  Global: --; Provider Global: -- Swollen: --; Tender: -- Joint Exam 03/15/2020   No joint exam has been documented for this visit   There is currently no information documented on the homunculus. Go to the Rheumatology activity and complete the homunculus joint exam.  Investigation: No additional findings.  Imaging: DG Foot Complete Right  Result Date: 03/02/2020 Please see detailed  radiograph report in office note.   Recent Labs: Lab Results  Component Value Date   WBC 7.2 08/20/2019   HGB 12.8 08/20/2019   PLT 231 08/20/2019   NA 137 08/20/2019   K 4.5 08/20/2019   CL 105 08/20/2019   CO2 26 08/20/2019   GLUCOSE 97 08/20/2019   BUN 9 08/20/2019   CREATININE 0.73 08/20/2019   BILITOT 0.4 08/20/2019   ALKPHOS 118 10/24/2017   AST 16 08/20/2019   ALT 13 08/20/2019   PROT 8.0 08/20/2019   ALBUMIN 3.9 10/24/2017   CALCIUM 10.5 (H) 08/20/2019   GFRAA 102 08/20/2019    Speciality Comments: No specialty comments available.  Procedures:  No procedures performed Allergies: Aspirin, Other, Contrast media [iodinated diagnostic agents], Iodine-131, Ioxaglate, Penicillins, and Prednisone   Assessment / Plan:     Visit Diagnoses: No diagnosis found.  Orders: No orders of the defined types were placed in this encounter.  No orders of the defined types were placed in this encounter.   Face-to-face time spent with patient was *** minutes. Greater than 50% of time was spent in counseling and coordination of care.  Follow-Up Instructions: No follow-ups on file.   Earnestine Mealing, CMA  Note - This record has been created using Editor, commissioning.  Chart creation errors have been sought, but may not always  have been located. Such creation errors do not reflect on  the standard of medical care.

## 2020-03-15 ENCOUNTER — Ambulatory Visit: Payer: BC Managed Care – PPO | Admitting: Rheumatology

## 2020-03-31 DIAGNOSIS — R42 Dizziness and giddiness: Secondary | ICD-10-CM | POA: Diagnosis not present

## 2020-03-31 DIAGNOSIS — Z7189 Other specified counseling: Secondary | ICD-10-CM | POA: Diagnosis not present

## 2020-03-31 DIAGNOSIS — R202 Paresthesia of skin: Secondary | ICD-10-CM | POA: Diagnosis not present

## 2020-04-06 ENCOUNTER — Ambulatory Visit: Payer: BC Managed Care – PPO | Admitting: Sports Medicine

## 2020-04-06 ENCOUNTER — Other Ambulatory Visit: Payer: Self-pay

## 2020-04-06 ENCOUNTER — Encounter: Payer: Self-pay | Admitting: Sports Medicine

## 2020-04-06 DIAGNOSIS — T148XXA Other injury of unspecified body region, initial encounter: Secondary | ICD-10-CM | POA: Diagnosis not present

## 2020-04-06 DIAGNOSIS — M779 Enthesopathy, unspecified: Secondary | ICD-10-CM | POA: Diagnosis not present

## 2020-04-06 DIAGNOSIS — M19079 Primary osteoarthritis, unspecified ankle and foot: Secondary | ICD-10-CM | POA: Diagnosis not present

## 2020-04-06 DIAGNOSIS — M79671 Pain in right foot: Secondary | ICD-10-CM

## 2020-04-06 NOTE — Progress Notes (Signed)
Subjective: Meghan Owen is a 64 y.o. female patient who returns to office for evaluation of right foot pain. Patient reports that pain is the same. Reports that CAM boot did not help but shoes with insoles/foam helped some. Requests MRI. No other issues noted.    Patient Active Problem List   Diagnosis Date Noted  . Osteoarthritis of both sacroiliac joints 01/04/2018  . Primary osteoarthritis of both knees 01/04/2018  . DDD (degenerative disc disease), cervical 01/04/2018  . DDD (degenerative disc disease), lumbar 01/04/2018  . Chronic right shoulder pain 01/04/2018  . Fibromyalgia 01/04/2018  . Hashimoto's thyroiditis 01/04/2018  . History of hyperparathyroidism 01/04/2018  . History of gastroesophageal reflux (GERD) 01/04/2018  . Family history of rheumatoid arthritis 01/04/2018  . Cervical myelopathy ([place]) 09/05/2016  . Primary osteoarthritis of left hip 01/04/2016  . Chronic left hip pain 12/15/2014  . Hypercalcemia 09/08/2013  . Varus deformity of foot 08/26/2013  . Peroneal tendonitis 05/23/2013  . Conversion disorder 05/21/2013  . Low back pain 11/01/2012  . Achilles tendinitis 09/05/2012  . Bilateral ankle pain 09/05/2012  . Hammer toe, acquired 09/05/2012  . Cubital tunnel syndrome on left 08/22/2012  . Stress fracture 07/04/2012  . Lumbar facet arthropathy 05/10/2012  . Diffuse myofascial pain syndrome 01/25/2012  . GERD 02/05/2009  . OTHER BURSITIS DISORDERS 02/05/2009  . ABNORMAL ELECTROCARDIOGRAM 02/05/2009    Current Outpatient Medications on File Prior to Visit  Medication Sig Dispense Refill  . alendronate (FOSAMAX) 70 MG tablet TAKE 1 TABLET 30 MINUTESHBEFORE THE FIRST FOOD, BEVERAGE, OR MEDICINE OF THE DAY WITH WATER WEEKLY.    Marland Kitchen Ascorbic Acid (VITAMIN C PO) Take 1 tablet by mouth daily.    Marland Kitchen b complex vitamins tablet Take 1 tablet by mouth daily.    . Cholecalciferol (VITAMIN D3) 5000 UNITS CAPS Take 5,000 Units by mouth daily.     . cyanocobalamin 1000  MCG tablet Take by mouth.    Marland Kitchen MAGNESIUM PO Take 500 mg by mouth daily.     . Multiple Vitamin (MULTIVITAMIN WITH MINERALS) TABS Take 1 tablet by mouth daily. [facility] OTC    . Probiotic Product (PROBIOTIC PO) Take 1 tablet by mouth daily.    . TURMERIC PO Take 1,500 mg by mouth 3 (three) times daily.     . vitamin E 1000 UNIT capsule Take by mouth.     No current facility-administered medications on file prior to visit.    Allergies  Allergen Reactions  . Aspirin Palpitations and Other (See Comments)    Aspirin with caffeine only. Causes tachycardia  . Other   . Contrast Media [Iodinated Diagnostic Agents] Other (See Comments)    Hot flashes   . Iodine-131 Other (See Comments)    flushing  . Ioxaglate Other (See Comments)    Hot flashes   . Penicillins Other (See Comments)    Makes have more infections  Has patient had a PCN reaction causing immediate rash, facial/tongue/throat swelling, SOB or lightheadedness with hypotension: No Has patient had a PCN reaction causing severe rash involving mucus membranes or skin necrosis: No Has patient had a PCN reaction that required hospitalization: No Has patient had a PCN reaction occurring within the last 10 years:No If all of the above answers are "NO", then may proceed with Cephalosporin use.   . Prednisone Other (See Comments)    Repeats infections    Objective:  General: Alert and oriented x3 in no acute distress  Dermatology: All surgical scars remain well-healed.  No open lesions bilateral lower extremities, no webspace macerations, no ecchymosis bilateral, all nails x 10 are well manicured.  Vascular: Dorsalis Pedis and Posterior Tibial pedal pulses palpable, Capillary Fill Time 3 seconds,(+) scant pedal hair growth bilateral, no edema bilateral lower extremities, Temperature gradient within normal limits.  Neurology: Gross sensation intact via light touch bilateral.  Musculoskeletal: Mild tenderness with palpation at dorsal  midfoot on the right, unchanged from previous.  Pes planus foot type.  Strength within normal limits in all groups bilateral with mild guarding on right due to pain.   Assessment and Plan: Problem List Items Addressed This Visit    None    Visit Diagnoses    Right foot pain    -  Primary   Arthritis of foot       Capsulitis       Ligament tear           -Complete examination performed -Discussed treatment options for pain related to likely arthritis versus capsulitis of right foot or possible old chronic ligament issues since she had this in the past -Patient declined steroid injection at this time -Rx MRI r/o tear -Advised patient if MRI is negative may benefit from arthritic panel -Patient to return to office after MRI or sooner if problems arise.  Landis Martins, DPM

## 2020-04-07 ENCOUNTER — Other Ambulatory Visit: Payer: Self-pay | Admitting: Sports Medicine

## 2020-04-07 ENCOUNTER — Telehealth: Payer: Self-pay | Admitting: *Deleted

## 2020-04-07 DIAGNOSIS — M779 Enthesopathy, unspecified: Secondary | ICD-10-CM

## 2020-04-07 DIAGNOSIS — T148XXA Other injury of unspecified body region, initial encounter: Secondary | ICD-10-CM

## 2020-04-07 DIAGNOSIS — M19079 Primary osteoarthritis, unspecified ankle and foot: Secondary | ICD-10-CM

## 2020-04-07 DIAGNOSIS — M79671 Pain in right foot: Secondary | ICD-10-CM

## 2020-04-07 NOTE — Telephone Encounter (Signed)
-----   Message from Landis Martins, [place] sent at 04/06/2020 12:56 PM EDT ----- Regarding: MRI R foot Eval for ligament tear, chronic pain in right foot

## 2020-04-07 NOTE — Telephone Encounter (Signed)
Faxed orders to [facility] and given to K. Mavrakis, CMA for pre-cert.

## 2020-04-07 NOTE — Telephone Encounter (Signed)
Called and spoke with Larkin Ina at [facility] and the representative stated that the procedure code [id number] does not require prior authorization and is based on medical necessity and the reference number is [id number]. Lattie Haw

## 2020-04-09 ENCOUNTER — Telehealth: Payer: Self-pay | Admitting: *Deleted

## 2020-04-09 NOTE — Telephone Encounter (Signed)
Called patient's insurance ([employer]) on 04/09/20 to get a prior authorization approved to get an MRI of Right Foot WO Contrast.  No PA is needed for patient's insurance  Reference [id number]

## 2020-04-09 NOTE — Telephone Encounter (Signed)
Dr. Cannon Kettle and Marcy Siren:  I called patient's insurance company, [employer], this afternoon. No prior authorization is needed for patient to get their MRI done.  Reference # is:  [id number]  Thank you,  Rolly Pancake, CMA (AAMA)

## 2020-04-15 NOTE — Progress Notes (Deleted)
Office Visit Note  Patient: Meghan Owen             Date of Birth: [birth year]           MRN: [id number]             PCP: Harlan Stains, MD Referring: Harlan Stains, MD Visit Date: 04/23/2020 Occupation: @GUAROCC @  Subjective:  No chief complaint on file.   History of Present Illness: Meghan Owen is a 64 y.o. female ***   Activities of Daily Living:  Patient reports morning stiffness for *** {minute/hour:19697}.   Patient {ACTIONS;DENIES/REPORTS:21021675::"Denies"} nocturnal pain.  Difficulty dressing/grooming: {ACTIONS;DENIES/REPORTS:21021675::"Denies"} Difficulty climbing stairs: {ACTIONS;DENIES/REPORTS:21021675::"Denies"} Difficulty getting out of chair: {ACTIONS;DENIES/REPORTS:21021675::"Denies"} Difficulty using hands for taps, buttons, cutlery, and/or writing: {ACTIONS;DENIES/REPORTS:21021675::"Denies"}  No Rheumatology ROS completed.   PMFS History:  Patient Active Problem List   Diagnosis Date Noted  . Osteoarthritis of both sacroiliac joints 01/04/2018  . Primary osteoarthritis of both knees 01/04/2018  . DDD (degenerative disc disease), cervical 01/04/2018  . DDD (degenerative disc disease), lumbar 01/04/2018  . Chronic right shoulder pain 01/04/2018  . Fibromyalgia 01/04/2018  . Hashimoto's thyroiditis 01/04/2018  . History of hyperparathyroidism 01/04/2018  . History of gastroesophageal reflux (GERD) 01/04/2018  . Family history of rheumatoid arthritis 01/04/2018  . Cervical myelopathy ([place]) 09/05/2016  . Primary osteoarthritis of left hip 01/04/2016  . Chronic left hip pain 12/15/2014  . Hypercalcemia 09/08/2013  . Varus deformity of foot 08/26/2013  . Peroneal tendonitis 05/23/2013  . Conversion disorder 05/21/2013  . Low back pain 11/01/2012  . Achilles tendinitis 09/05/2012  . Bilateral ankle pain 09/05/2012  . Hammer toe, acquired 09/05/2012  . Cubital tunnel syndrome on left 08/22/2012  . Stress fracture 07/04/2012  . Lumbar facet  arthropathy 05/10/2012  . Diffuse myofascial pain syndrome 01/25/2012  . GERD 02/05/2009  . OTHER BURSITIS DISORDERS 02/05/2009  . ABNORMAL ELECTROCARDIOGRAM 02/05/2009    Past Medical History:  Diagnosis Date  . Arthritis   . Fibromyalgia   . GERD (gastroesophageal reflux disease)    HX  . Hashimoto's thyroiditis   . Headache   . Hypothyroidism   . Liver cyst    BENIGN  . Parathyroid disorder ([place])    HYPERPARATHYROID  . Spinal stenosis   . Thyroid disease     Family History  Problem Relation Age of Onset  . Lupus Mother   . Rheum arthritis Mother   . Dementia Father   . Leukemia Father   . Cancer Other   . Cancer Sister        bone   . Healthy Son   . Healthy Son   . Healthy Daughter    Past Surgical History:  Procedure Laterality Date  . ABDOMINAL HYSTERECTOMY    . ANKLE SURGERY    . ANTERIOR CERVICAL DECOMPRESSION/DISCECTOMY FUSION 4 LEVELS N/A 09/05/2016   Procedure: Cervical three-four Cervical four-five  Cervical five-six Cervical six-seven  Anterior cervical decompression/diskectomy/fusion;  Surgeon: Erline Levine, MD;  Location: [facility];  Service: Neurosurgery;  Laterality: N/A;  . CARPAL TUNNEL RELEASE Left 02/26/2018  . ELBOW SURGERY  04/2017   cyst removed   . FOOT SURGERY     Social History   Social History Narrative  . Not on file    There is no immunization history on file for this patient.   Objective: Vital Signs: There were no vitals taken for this visit.   Physical Exam   Musculoskeletal Exam: ***  CDAI Exam: CDAI Score: -- Patient  Global: --; Provider Global: -- Swollen: --; Tender: -- Joint Exam 04/23/2020   No joint exam has been documented for this visit   There is currently no information documented on the homunculus. Go to the Rheumatology activity and complete the homunculus joint exam.  Investigation: No additional findings.  Imaging: No results found.  Recent Labs: Lab Results  Component Value Date   WBC  7.2 08/20/2019   HGB 12.8 08/20/2019   PLT 231 08/20/2019   NA 137 08/20/2019   K 4.5 08/20/2019   CL 105 08/20/2019   CO2 26 08/20/2019   GLUCOSE 97 08/20/2019   BUN 9 08/20/2019   CREATININE 0.73 08/20/2019   BILITOT 0.4 08/20/2019   ALKPHOS 118 10/24/2017   AST 16 08/20/2019   ALT 13 08/20/2019   PROT 8.0 08/20/2019   ALBUMIN 3.9 10/24/2017   CALCIUM 10.5 (H) 08/20/2019   GFRAA 102 08/20/2019    Speciality Comments: No specialty comments available.  Procedures:  No procedures performed Allergies: Aspirin, Other, Contrast media [iodinated diagnostic agents], Iodine-131, Ioxaglate, Penicillins, and Prednisone   Assessment / Plan:     Visit Diagnoses: No diagnosis found.  Orders: No orders of the defined types were placed in this encounter.  No orders of the defined types were placed in this encounter.   Face-to-face time spent with patient was *** minutes. Greater than 50% of time was spent in counseling and coordination of care.  Follow-Up Instructions: No follow-ups on file.   Ofilia Neas, PA-C  Note - This record has been created using Dragon software.  Chart creation errors have been sought, but may not always  have been located. Such creation errors do not reflect on  the standard of medical care.

## 2020-04-23 ENCOUNTER — Ambulatory Visit: Payer: Self-pay | Admitting: Rheumatology

## 2020-04-23 NOTE — Progress Notes (Deleted)
Office Visit Note  Patient: Meghan Owen             Date of Birth: [birth year]           MRN: [id number]             PCP: Harlan Stains, MD Referring: Harlan Stains, MD Visit Date: 05/07/2020 Occupation: @GUAROCC @  Subjective:  No chief complaint on file.   History of Present Illness: Meghan Owen is a 64 y.o. female ***   Activities of Daily Living:  Patient reports morning stiffness for *** {minute/hour:19697}.   Patient {ACTIONS;DENIES/REPORTS:21021675::"Denies"} nocturnal pain.  Difficulty dressing/grooming: {ACTIONS;DENIES/REPORTS:21021675::"Denies"} Difficulty climbing stairs: {ACTIONS;DENIES/REPORTS:21021675::"Denies"} Difficulty getting out of chair: {ACTIONS;DENIES/REPORTS:21021675::"Denies"} Difficulty using hands for taps, buttons, cutlery, and/or writing: {ACTIONS;DENIES/REPORTS:21021675::"Denies"}  No Rheumatology ROS completed.   PMFS History:  Patient Active Problem List   Diagnosis Date Noted  . Osteoarthritis of both sacroiliac joints 01/04/2018  . Primary osteoarthritis of both knees 01/04/2018  . DDD (degenerative disc disease), cervical 01/04/2018  . DDD (degenerative disc disease), lumbar 01/04/2018  . Chronic right shoulder pain 01/04/2018  . Fibromyalgia 01/04/2018  . Hashimoto's thyroiditis 01/04/2018  . History of hyperparathyroidism 01/04/2018  . History of gastroesophageal reflux (GERD) 01/04/2018  . Family history of rheumatoid arthritis 01/04/2018  . Cervical myelopathy ([place]) 09/05/2016  . Primary osteoarthritis of left hip 01/04/2016  . Chronic left hip pain 12/15/2014  . Hypercalcemia 09/08/2013  . Varus deformity of foot 08/26/2013  . Peroneal tendonitis 05/23/2013  . Conversion disorder 05/21/2013  . Low back pain 11/01/2012  . Achilles tendinitis 09/05/2012  . Bilateral ankle pain 09/05/2012  . Hammer toe, acquired 09/05/2012  . Cubital tunnel syndrome on left 08/22/2012  . Stress fracture 07/04/2012  . Lumbar facet  arthropathy 05/10/2012  . Diffuse myofascial pain syndrome 01/25/2012  . GERD 02/05/2009  . OTHER BURSITIS DISORDERS 02/05/2009  . ABNORMAL ELECTROCARDIOGRAM 02/05/2009    Past Medical History:  Diagnosis Date  . Arthritis   . Fibromyalgia   . GERD (gastroesophageal reflux disease)    HX  . Hashimoto's thyroiditis   . Headache   . Hypothyroidism   . Liver cyst    BENIGN  . Parathyroid disorder ([place])    HYPERPARATHYROID  . Spinal stenosis   . Thyroid disease     Family History  Problem Relation Age of Onset  . Lupus Mother   . Rheum arthritis Mother   . Dementia Father   . Leukemia Father   . Cancer Other   . Cancer Sister        bone   . Healthy Son   . Healthy Son   . Healthy Daughter    Past Surgical History:  Procedure Laterality Date  . ABDOMINAL HYSTERECTOMY    . ANKLE SURGERY    . ANTERIOR CERVICAL DECOMPRESSION/DISCECTOMY FUSION 4 LEVELS N/A 09/05/2016   Procedure: Cervical three-four Cervical four-five  Cervical five-six Cervical six-seven  Anterior cervical decompression/diskectomy/fusion;  Surgeon: Erline Levine, MD;  Location: [facility];  Service: Neurosurgery;  Laterality: N/A;  . CARPAL TUNNEL RELEASE Left 02/26/2018  . ELBOW SURGERY  04/2017   cyst removed   . FOOT SURGERY     Social History   Social History Narrative  . Not on file    There is no immunization history on file for this patient.   Objective: Vital Signs: There were no vitals taken for this visit.   Physical Exam   Musculoskeletal Exam: ***  CDAI Exam: CDAI Score: -- Patient  Global: --; Provider Global: -- Swollen: --; Tender: -- Joint Exam 05/07/2020   No joint exam has been documented for this visit   There is currently no information documented on the homunculus. Go to the Rheumatology activity and complete the homunculus joint exam.  Investigation: No additional findings.  Imaging: No results found.  Recent Labs: Lab Results  Component Value Date   WBC  7.2 08/20/2019   HGB 12.8 08/20/2019   PLT 231 08/20/2019   NA 137 08/20/2019   K 4.5 08/20/2019   CL 105 08/20/2019   CO2 26 08/20/2019   GLUCOSE 97 08/20/2019   BUN 9 08/20/2019   CREATININE 0.73 08/20/2019   BILITOT 0.4 08/20/2019   ALKPHOS 118 10/24/2017   AST 16 08/20/2019   ALT 13 08/20/2019   PROT 8.0 08/20/2019   ALBUMIN 3.9 10/24/2017   CALCIUM 10.5 (H) 08/20/2019   GFRAA 102 08/20/2019    Speciality Comments: No specialty comments available.  Procedures:  No procedures performed Allergies: Aspirin, Other, Contrast media [iodinated diagnostic agents], Iodine-131, Ioxaglate, Penicillins, and Prednisone   Assessment / Plan:     Visit Diagnoses: No diagnosis found.  Orders: No orders of the defined types were placed in this encounter.  No orders of the defined types were placed in this encounter.   Face-to-face time spent with patient was *** minutes. Greater than 50% of time was spent in counseling and coordination of care.  Follow-Up Instructions: No follow-ups on file.   Ofilia Neas, PA-C  Note - This record has been created using Dragon software.  Chart creation errors have been sought, but may not always  have been located. Such creation errors do not reflect on  the standard of medical care.

## 2020-04-24 ENCOUNTER — Other Ambulatory Visit: Payer: BC Managed Care – PPO

## 2020-05-06 ENCOUNTER — Encounter: Payer: Self-pay | Admitting: *Deleted

## 2020-05-07 ENCOUNTER — Encounter: Payer: Self-pay | Admitting: Neurology

## 2020-05-07 ENCOUNTER — Ambulatory Visit (INDEPENDENT_AMBULATORY_CARE_PROVIDER_SITE_OTHER): Payer: BC Managed Care – PPO | Admitting: Neurology

## 2020-05-07 ENCOUNTER — Ambulatory Visit: Payer: Self-pay | Admitting: Physician Assistant

## 2020-05-07 ENCOUNTER — Other Ambulatory Visit: Payer: Self-pay

## 2020-05-07 VITALS — BP 129/74 | HR 63 | Ht 65.0 in | Wt 250.0 lb

## 2020-05-07 DIAGNOSIS — E538 Deficiency of other specified B group vitamins: Secondary | ICD-10-CM | POA: Diagnosis not present

## 2020-05-07 DIAGNOSIS — R202 Paresthesia of skin: Secondary | ICD-10-CM

## 2020-05-07 DIAGNOSIS — M797 Fibromyalgia: Secondary | ICD-10-CM

## 2020-05-07 NOTE — Progress Notes (Signed)
Reason for visit: Paresthesias  Referring physician: Dr. Sherril Cong is a 64 y.o. female  History of present illness:  Meghan Owen is a 64 year old right-handed black female with a history of cervical spondylosis in the past requiring surgery.  The patient has been seen through neurology off and on since 1998 for episodic tremors and gait disturbance.  The patient was seen in 2014 through neurology and was felt to have a psychogenic tremor.  The patient over the years has had neuromuscular discomfort and there is some question of fibromyalgia in the past.  The patient however has had chronically elevated inflammatory markers with sedimentation rate and C-reactive protein.  She has had a positive ANA in the past, she is currently followed by Dr. Estanislado Pandy for an undetermined connective tissue disorder, possibly rheumatoid arthritis.  The patient has also been found to have Hashimoto's thyroiditis and primary hyperparathyroidism, she is followed through Dr. Buddy Duty through endocrinology.  The patient has undergone cervical spine surgery by Dr. Vertell Limber in 2017.  She has had over many years intermittent episodes of prickly sensations migrating about the body, these episodes have worsened since 12-06-2019.  She goes on to say that her husband died in 2019-12-06.  The patient may go 2 weeks without any symptoms and then the symptoms may come and go for a week and then disappear again.  The symptoms oftentimes are worse at night when she is trying to get to sleep.  She reports no associated shortness of breath or increased heart rate with the events.  She does note however that she oftentimes will get some dizziness associated with a floating sensation that correlates with the onset of the prickly sensations and crawling sensations of the skin.  The episodes may occur on the arms or legs or body or face.  She sometimes will have sharp pains in the V3 distribution on the left.  She does have some  chronic low back pain and some neck discomfort but no pain going down the arms.  She denies issues controlling the bowels or the bladder.  She believes that there has been some mild change in balance but she reports no falls.  When she does get a headache, the headache may last 2 to 3 hours, she usually does not take any medications for the headache which may occur 4-6 times a month.  Headaches tend to come up in the back of the head.  There is no associated nausea or vomiting or photophobia or phonophobia.  She still has some tremor that comes and goes but is not always present.  Given the above symptoms, the patient is sent to this office for further evaluation.  In the past, MRI evaluation of the brain have been completely normal.  Past Medical History:  Diagnosis Date  . Arthritis   . Bursitis, hip    bilateral  . DDD (degenerative disc disease), lumbar   . Fibromyalgia   . GERD (gastroesophageal reflux disease)    HX  . Hashimoto's thyroiditis   . Headache   . Hyperparathyroidism ([place])    Dr. Elyse Hsu  . Hypothyroidism   . Liver cyst    BENIGN  . Osteoarthritis   . Parathyroid disorder ([place])    HYPERPARATHYROID  . Spinal stenosis   . Thyroid disease     Past Surgical History:  Procedure Laterality Date  . ABDOMINAL HYSTERECTOMY    . ANKLE SURGERY    . ANTERIOR CERVICAL DECOMPRESSION/DISCECTOMY FUSION 4  LEVELS N/A 09/05/2016   Procedure: Cervical three-four Cervical four-five  Cervical five-six Cervical six-seven  Anterior cervical decompression/diskectomy/fusion;  Surgeon: Erline Levine, MD;  Location: [facility];  Service: Neurosurgery;  Laterality: N/A;  . CARPAL TUNNEL RELEASE Left 02/26/2018  . ELBOW SURGERY  04/2017   cyst removed   . FOOT SURGERY      Family History  Problem Relation Age of Onset  . Lupus Mother   . Rheum arthritis Mother   . Dementia Father   . Leukemia Father   . Cancer Other   . Cancer Sister        bone   . Healthy Son   . Healthy Son     . Healthy Daughter     Social history:  reports that she has quit smoking. Her smoking use included cigarettes. She has never used smokeless tobacco. She reports that she does not drink alcohol or use drugs.  Medications:  Prior to Admission medications   Medication Sig Start Date End Date Taking? Authorizing Provider  Ascorbic Acid (VITAMIN C PO) Take 1 tablet by mouth daily.   Yes [provider]  b complex vitamins tablet Take 1 tablet by mouth daily.   Yes [provider]  Cholecalciferol (VITAMIN D3) 5000 UNITS CAPS Take 5,000 Units by mouth daily.    Yes [provider]  cyanocobalamin 1000 MCG tablet Take by mouth.   Yes [provider]  MAGNESIUM PO Take 500 mg by mouth daily.    Yes [provider]  Multiple Vitamin (MULTIVITAMIN WITH MINERALS) TABS Take 1 tablet by mouth daily. [employer] OTC   Yes [provider]  Probiotic Product (PROBIOTIC PO) Take 1 tablet by mouth daily.   Yes [provider]  TURMERIC PO Take 1,500 mg by mouth 3 (three) times daily.    Yes [provider]  UNABLE TO FIND Med Name: Curamin 1 tablet as needed 350mg    Yes [provider]  vitamin E 1000 UNIT capsule Take by mouth.   Yes [provider]      Allergies  Allergen Reactions  . Aspirin Palpitations and Other (See Comments)    Aspirin with caffeine only. Causes tachycardia  . Other   . Contrast Media [Iodinated Diagnostic Agents] Other (See Comments)    Hot flashes   . Iodine-131 Other (See Comments)    flushing  . Ioxaglate Other (See Comments)    Hot flashes   . Penicillins Other (See Comments)    Makes have more infections  Has patient had a PCN reaction causing immediate rash, facial/tongue/throat swelling, SOB or lightheadedness with hypotension: No Has patient had a PCN reaction causing severe rash involving mucus membranes or skin necrosis: No Has patient had a PCN reaction that required  hospitalization: No Has patient had a PCN reaction occurring within the last 10 years:No If all of the above answers are "NO", then may proceed with Cephalosporin use.   . Prednisone Other (See Comments)    Repeats infections    ROS:  Out of a complete 14 system review of symptoms, the patient complains only of the following symptoms, and all other reviewed systems are negative.  Paresthesias Tremor Chronic low back pain Dizziness Headache  Height 5\' 5"  (1.651 m), weight 250 lb (113.4 kg).  Physical Exam  General: The patient is alert and cooperative at the time of the examination.  The patient is moderately to markedly obese.  Eyes: Pupils are equal, round, and reactive to  light. Discs are flat bilaterally.  Neck: The neck is supple, no carotid bruits are noted.  Respiratory: The respiratory examination is clear.  Cardiovascular: The cardiovascular examination reveals a regular rate and rhythm, no obvious murmurs or rubs are noted.  Skin: Extremities are without significant edema.  Neurologic Exam  Mental status: The patient is alert and oriented x 3 at the time of the examination. The patient has apparent normal recent and remote memory, with an apparently normal attention span and concentration ability.  Cranial nerves: Facial symmetry is present. There is good sensation of the face to pinprick and soft touch on the right, slightly decreased pinprick on the left lower face, not on the forehead. The strength of the facial muscles and the muscles to head turning and shoulder shrug are normal bilaterally. Speech is well enunciated, no aphasia or dysarthria is noted. Extraocular movements are full. Visual fields are full. The tongue is midline, and the patient has symmetric elevation of the soft palate. No obvious hearing deficits are noted.  Motor: The motor testing reveals 5 over 5 strength of all 4 extremities. Good symmetric motor tone is noted throughout.  Sensory:  Sensory testing is intact to pinprick, soft touch, vibration sensation, and position sense on all 4 extremities, with exception of some decreased pinprick sensation on the left foot as compared to the right. No evidence of extinction is noted.  Coordination: Cerebellar testing reveals good finger-nose-finger and heel-to-shin bilaterally.  Gait and station: Gait is normal. Tandem gait is normal. Romberg is negative. No drift is seen.  Reflexes: Deep tendon reflexes are symmetric, but are depressed bilaterally. Toes are downgoing bilaterally.   Prior Laboratory Studies:  1. [facility] 08/27/2012 for left cervical radiculopathy vs ulnar neuropathy: Normal. 2. MRI lumbar spine 04/18/2012: No significant degenerative disc disease of the lumbar spine. Mild multilevel facet arthrosis. 3. MRI cervical spine 11/26/2014: 1.Normal non contrast MRI appearance of the brain. 2. Chronic cervical spine degeneration with chronic multilevel degenerative spinal stenosis with spinal cord mass effect but no cord signal abnormality, and moderate to severe cervical foraminal stenosis. The levels C3-C4 through C5-C6 are stable since 2013. 3. Progressed degenerative changes at C6-C7 and C7-T1 since 2013, now with mild spinal stenosis without cord mass effect, and severe left C7 foraminal stenosis. 4. [facility] 03/07/2016: Normal except for large amplitude motor unit potentials in the left triceps brachii. A full, separate report is available.     Assessment/Plan:  1.  Migratory paresthesias, total body, intermittent  2.  Intermittent headache  3.  Intermittent dizziness, often associated with paresthesias  The description of migratory paresthesias with symptom-free periods usually is associated with a benign disorder.  This is most often related to the stress or sometimes associated with a fibromyalgia type syndrome.  The patient will undergo a brief work-up, she does not wish to go on a medication such as  gabapentin to help with the sensations.  The patient will undergo MRI of the brain and cervical spine, and undergo blood work today.  She will follow-up here if needed.  Further evaluation will be done depending upon the results of the above.  I suspect that her symptoms are related to stress following the death of her husband.  Meghan Alexanders MD 05/07/2020 7:58 AM  [facility] [address] [place], [place] [address]  Phone [phone] Fax [phone]

## 2020-05-09 LAB — VITAMIN B12: Vitamin B-12: 547 pg/mL (ref 232–1245)

## 2020-05-09 LAB — COPPER, SERUM: Copper: 198 ug/dL — ABNORMAL HIGH (ref 80–158)

## 2020-05-09 LAB — RPR: RPR Ser Ql: NONREACTIVE

## 2020-05-11 ENCOUNTER — Telehealth: Payer: Self-pay | Admitting: Neurology

## 2020-05-11 NOTE — Telephone Encounter (Signed)
Dr. Jannifer Franklin has already contacted her

## 2020-05-11 NOTE — Progress Notes (Signed)
Office Visit Note  Patient: Meghan Owen             Date of Birth: August 12, 1956           MRN: [id number]             PCP: Harlan Stains, MD Referring: Harlan Stains, MD Visit Date: 05/25/2020 Occupation: '@GUAROCC'$ @  Subjective:  Neck pain   History of Present Illness: Meghan Owen is a 64 y.o. female with history of palindromic rheumatism and osteoarthritis.  Patient reports that since her last office visit on 08/20/2019 her arthralgias and joint inflammation have improved.  She continues to have chronic neck and lower back pain.  According to the patient she has established care with Dr. Jannifer Franklin for further evaluation of migratory paresthesias and intermittent headaches.  She will be scheduled for an MRI of the C-spine and brain to complete the work-up.  She states that she has some discomfort in the left shoulder but is unsure if it is due to the neck pain she has been experiencing.  She states that the pain and inflammation in both knees has improved since her last visit.  She denies any pain in her hands or wrist joints at this time.  She continues have intermittent discomfort in both ankle joints.   Activities of Daily Living:  Patient reports morning stiffness for 5-6  minutes.   Patient Reports nocturnal pain.  Difficulty dressing/grooming: Denies Difficulty climbing stairs: Denies Difficulty getting out of chair: Denies Difficulty using hands for taps, buttons, cutlery, and/or writing: Denies  Review of Systems  Constitutional: Positive for fatigue.  HENT: Negative for mouth sores, mouth dryness and nose dryness.   Eyes: Negative for pain, itching, visual disturbance and dryness.  Respiratory: Negative for cough, hemoptysis, shortness of breath and difficulty breathing.   Cardiovascular: Negative for chest pain, palpitations, hypertension and swelling in legs/feet.  Gastrointestinal: Negative for blood in stool, constipation and diarrhea.  Endocrine: Negative for increased  urination.  Genitourinary: Negative for difficulty urinating and painful urination.  Musculoskeletal: Positive for arthralgias, joint pain, joint swelling, morning stiffness and muscle tenderness. Negative for myalgias, muscle weakness and myalgias.  Skin: Positive for color change. Negative for pallor, rash, hair loss, nodules/bumps, redness, skin tightness, ulcers and sensitivity to sunlight.  Allergic/Immunologic: Negative for susceptible to infections.  Neurological: Positive for dizziness, headaches and weakness. Negative for numbness and memory loss.  Hematological: Negative for bruising/bleeding tendency and swollen glands.  Psychiatric/Behavioral: Negative for depressed mood, confusion and sleep disturbance. The patient is not nervous/anxious.     PMFS History:  Patient Active Problem List   Diagnosis Date Noted  . Osteoarthritis of both sacroiliac joints 01/04/2018  . Primary osteoarthritis of both knees 01/04/2018  . DDD (degenerative disc disease), cervical 01/04/2018  . DDD (degenerative disc disease), lumbar 01/04/2018  . Chronic right shoulder pain 01/04/2018  . Fibromyalgia 01/04/2018  . Hashimoto's thyroiditis 01/04/2018  . History of hyperparathyroidism 01/04/2018  . History of gastroesophageal reflux (GERD) 01/04/2018  . Family history of rheumatoid arthritis 01/04/2018  . Cervical myelopathy ([place]) 09/05/2016  . Primary osteoarthritis of left hip 01/04/2016  . Chronic left hip pain 12/15/2014  . Hypercalcemia 09/08/2013  . Varus deformity of foot 08/26/2013  . Peroneal tendonitis 05/23/2013  . Conversion disorder 05/21/2013  . Low back pain 11/01/2012  . Achilles tendinitis 09/05/2012  . Bilateral ankle pain 09/05/2012  . Hammer toe, acquired 09/05/2012  . Cubital tunnel syndrome on left 08/22/2012  . Stress fracture  07/04/2012  . Lumbar facet arthropathy 05/10/2012  . Diffuse myofascial pain syndrome 01/25/2012  . GERD 02/05/2009  . OTHER BURSITIS DISORDERS  02/05/2009  . ABNORMAL ELECTROCARDIOGRAM 02/05/2009    Past Medical History:  Diagnosis Date  . Arthritis   . Bursitis, hip    bilateral  . DDD (degenerative disc disease), lumbar   . Fibromyalgia   . GERD (gastroesophageal reflux disease)    HX  . Hashimoto's thyroiditis   . Headache   . Hyperparathyroidism ([place])    Dr. Elyse Hsu  . Hypothyroidism   . Liver cyst    BENIGN  . Osteoarthritis   . Parathyroid disorder ([place])    HYPERPARATHYROID  . Spinal stenosis   . Thyroid disease     Family History  Problem Relation Age of Onset  . Lupus Mother   . Rheum arthritis Mother   . Dementia Father   . Leukemia Father   . Cancer Other   . Cancer Sister        bone   . Healthy Son   . Healthy Son   . Healthy Daughter    Past Surgical History:  Procedure Laterality Date  . ABDOMINAL HYSTERECTOMY    . ANKLE SURGERY    . ANTERIOR CERVICAL DECOMPRESSION/DISCECTOMY FUSION 4 LEVELS N/A 09/05/2016   Procedure: Cervical three-four Cervical four-five  Cervical five-six Cervical six-seven  Anterior cervical decompression/diskectomy/fusion;  Surgeon: Erline Levine, MD;  Location: [facility];  Service: Neurosurgery;  Laterality: N/A;  . CARPAL TUNNEL RELEASE Left 02/26/2018  . ELBOW SURGERY  04/2017   cyst removed   . FOOT SURGERY     Social History   Social History Narrative   Caffeine yes, widowed.  3 children.     There is no immunization history on file for this patient.   Objective: Vital Signs: BP 122/77 (BP Location: Left Arm, Patient Position: Sitting, Cuff Size: Normal)   Pulse (!) 58   Resp 15   Ht '5\' 5"'$  (1.651 m)   Wt 245 lb 9.6 oz (111.4 kg)   BMI 40.87 kg/m    Physical Exam Vitals and nursing note reviewed.  Constitutional:      Appearance: She is well-developed.  HENT:     Head: Normocephalic and atraumatic.  Eyes:     Conjunctiva/sclera: Conjunctivae normal.  Pulmonary:     Effort: Pulmonary effort is normal.  Abdominal:     General: Bowel sounds  are normal.     Palpations: Abdomen is soft.  Musculoskeletal:     Cervical back: Normal range of motion.  Lymphadenopathy:     Cervical: No cervical adenopathy.  Skin:    General: Skin is warm and dry.     Capillary Refill: Capillary refill takes less than 2 seconds.  Neurological:     Mental Status: She is alert and oriented to person, place, and time.  Psychiatric:        Behavior: Behavior normal.      Musculoskeletal Exam: C-spine good ROM with no discomfort.  No midline spinal tenderness.  Shoulder joints, elbow joints, wrist joints, MCPs, PIPs, and DIPs good ROM with no synovitis.  Complete fist formation bilaterally.  Hip joints good ROM with no discomfort.  Right knee warmth.  She has good ROM of both knee joints with no discomfort.  No tenderness or swelling of ankle joints.   CDAI Exam: CDAI Score: -- Patient Global: --; Provider Global: -- Swollen: --; Tender: -- Joint Exam 05/25/2020   No joint exam has  been documented for this visit   There is currently no information documented on the homunculus. Go to the Rheumatology activity and complete the homunculus joint exam.  Investigation: No additional findings.  Imaging: No results found.  Recent Labs: Lab Results  Component Value Date   WBC 7.2 08/20/2019   HGB 12.8 08/20/2019   PLT 231 08/20/2019   NA 137 08/20/2019   K 4.5 08/20/2019   CL 105 08/20/2019   CO2 26 08/20/2019   GLUCOSE 97 08/20/2019   BUN 9 08/20/2019   CREATININE 0.73 08/20/2019   BILITOT 0.4 08/20/2019   ALKPHOS 118 10/24/2017   AST 16 08/20/2019   ALT 13 08/20/2019   PROT 8.0 08/20/2019   ALBUMIN 3.9 10/24/2017   CALCIUM 10.5 (H) 08/20/2019   GFRAA 102 08/20/2019    Speciality Comments: No specialty comments available.  Procedures:  No procedures performed Allergies: Aspirin, Other, Contrast media [iodinated diagnostic agents], Iodine-131, Ioxaglate, Penicillins, and Prednisone   Assessment / Plan:     Visit Diagnoses:  Palindromic rheumatism: She has no tenderness or synovitis on exam.  Lab work from 08/20/2019 was reviewed with the patient today in the office.  '14 3 3 '$ eta negative, anti-CCP negative, RF negative, ACE within normal limits, uric acid within normal limits, magnesium within normal limits, and sed rate was 86.  She had a repeat sed rate ordered on 02/04/2020 which was 18.  She has been experiencing less frequent and severe arthralgias since her last office visit.  She has warmth of the right knee joint on exam. She has been experiencing migratory paresthesias and has establish care with Dr. Jannifer Franklin and has an upcoming MRI of the brain and C-spine for further evaluation.  She was advised to notify us if she develops increased joint pain or joint swelling. She will follow up in 6 months.   Osteoarthritis of both sacroiliac joints - HLA-B27 negative, MRI negative for sacroiliitis. MRI on 01/01/2018 revealed chronic fusion of superior aspect of right SI joint.  She has no SI joint at this time.   Elevated sed rate - History of elevated sed rate.  ESR was 86 on 08/20/2019.  ESR 18 on 02/04/2020.  Family history of rheumatoid arthritis  Primary osteoarthritis of both knees - Left knee-mild OA with severe chrondomalacia patella.  Right knee- no OA, chondromalacia patella noted.  She is not experiencing any discomfort in her knee joints at this time.  She has good range of motion of both knees.  Mild warmth of the right knee joint noted.  No effusion noted.  DDD (degenerative disc disease), cervical - S/p fusion-Dr. Stern 2017: She is been experiencing increased neck pain recently.  She has also had episodic migratory paresthesias, intermittent headaches, and intermittent dizziness recently.  She established care with Dr. Jannifer Franklin and will be having an MRI of the brain and C-spine for further evaluation.  DDD (degenerative disc disease), lumbar: She has chronic lower back pain.  She is not experiencing any symptoms of  radiculopathy currently.  Fibromyalgia: She experiences generalized myalgias and muscle tenderness due to fibromyalgia.  She also has chronic fatigue secondary to insomnia.  She has been experiencing migratory paresthesias, intermittent headaches, and intermittent dizziness.  She established care with Dr. Jannifer Franklin and is undergoing a thorough work-up with MRI of the brain and C-spine.  He has suspicious that her symptoms are due to underlying stress and fibromyalgia.  Other fatigue: Chronic but stable.  Class 3 severe obesity due to excess calories without  serious comorbidity with body mass index (BMI) of 40.0 to 44.9 in adult [facility])  History of gastroesophageal reflux (GERD)  Hashimoto's thyroiditis  History of hyperparathyroidism - She is followed by Dr. Buddy Duty.  Orders: No orders of the defined types were placed in this encounter.  No orders of the defined types were placed in this encounter.     Follow-Up Instructions: Return in about 6 months (around 11/24/2020) for Palindromic rheumatism.   Ofilia Neas, PA-C  Note - This record has been created using Dragon software.  Chart creation errors have been sought, but may not always  have been located. Such creation errors do not reflect on  the standard of medical care.

## 2020-05-11 NOTE — Telephone Encounter (Signed)
Pt called wanting RN to call her back to discuss questions she has about the lab work she last had.

## 2020-05-11 NOTE — Telephone Encounter (Signed)
I called pt about her lab work results. I stated per Dr .Jannifer Franklin blood work is unremarkable with exception of a slightly elevated copper level. Elevated copper levels may be associated with thyroid disease either high or low, and with rheumatoid arthritis. Dietary sources include eggs, liver and oysters. If you eat high levels of any of these sources of copper, I would reduce the intake.. Pt does daily to lose weight. SHe does see rheumatology for hashimoto thyroiditis. She verbalized understanding.

## 2020-05-19 ENCOUNTER — Telehealth: Payer: Self-pay | Admitting: Neurology

## 2020-05-19 NOTE — Telephone Encounter (Signed)
no to the covid questions MR Brain wo contrast & MR Cervical spine wo contrast Dr. Stephanie Acre Auth: [place] Ref # [id number]. Patient is scheduled at [facility] for 06/02/20.

## 2020-05-22 ENCOUNTER — Other Ambulatory Visit: Payer: BC Managed Care – PPO

## 2020-05-25 ENCOUNTER — Other Ambulatory Visit: Payer: Self-pay

## 2020-05-25 ENCOUNTER — Encounter: Payer: Self-pay | Admitting: Physician Assistant

## 2020-05-25 ENCOUNTER — Ambulatory Visit: Payer: BC Managed Care – PPO | Admitting: Physician Assistant

## 2020-05-25 VITALS — BP 122/77 | HR 58 | Resp 15 | Ht 65.0 in | Wt 245.6 lb

## 2020-05-25 DIAGNOSIS — M51369 Other intervertebral disc degeneration, lumbar region without mention of lumbar back pain or lower extremity pain: Secondary | ICD-10-CM

## 2020-05-25 DIAGNOSIS — M5030 Other cervical disc degeneration, unspecified cervical region: Secondary | ICD-10-CM

## 2020-05-25 DIAGNOSIS — M461 Sacroiliitis, not elsewhere classified: Secondary | ICD-10-CM

## 2020-05-25 DIAGNOSIS — M1230 Palindromic rheumatism, unspecified site: Secondary | ICD-10-CM

## 2020-05-25 DIAGNOSIS — Z8719 Personal history of other diseases of the digestive system: Secondary | ICD-10-CM

## 2020-05-25 DIAGNOSIS — M47818 Spondylosis without myelopathy or radiculopathy, sacral and sacrococcygeal region: Secondary | ICD-10-CM

## 2020-05-25 DIAGNOSIS — R700 Elevated erythrocyte sedimentation rate: Secondary | ICD-10-CM | POA: Diagnosis not present

## 2020-05-25 DIAGNOSIS — M170 Bilateral primary osteoarthritis of knee: Secondary | ICD-10-CM

## 2020-05-25 DIAGNOSIS — M5136 Other intervertebral disc degeneration, lumbar region: Secondary | ICD-10-CM

## 2020-05-25 DIAGNOSIS — Z8261 Family history of arthritis: Secondary | ICD-10-CM | POA: Diagnosis not present

## 2020-05-25 DIAGNOSIS — Z6841 Body Mass Index (BMI) 40.0 and over, adult: Secondary | ICD-10-CM

## 2020-05-25 DIAGNOSIS — R5383 Other fatigue: Secondary | ICD-10-CM

## 2020-05-25 DIAGNOSIS — Z8639 Personal history of other endocrine, nutritional and metabolic disease: Secondary | ICD-10-CM

## 2020-05-25 DIAGNOSIS — E063 Autoimmune thyroiditis: Secondary | ICD-10-CM

## 2020-05-25 DIAGNOSIS — E66813 Obesity, class 3: Secondary | ICD-10-CM

## 2020-05-25 DIAGNOSIS — M797 Fibromyalgia: Secondary | ICD-10-CM

## 2020-05-27 ENCOUNTER — Telehealth: Payer: Self-pay | Admitting: *Deleted

## 2020-05-27 NOTE — Telephone Encounter (Signed)
Lab results received for [facility] on 02/04/2020 Reviewed by Dr. Deveshwar   ESR: 18 TSH: 0.68 Glucose 94 Hgb A1C 6.2 eAG 131 Albumin 4.0 CPK 67 Calcium 10.6 

## 2020-05-31 NOTE — Telephone Encounter (Signed)
Pt called to r/s her MRI. Please advise.

## 2020-06-01 NOTE — Telephone Encounter (Signed)
I spoke to the patient she is rescheduled at [facility] for 06/09/20.

## 2020-06-02 ENCOUNTER — Other Ambulatory Visit: Payer: BC Managed Care – PPO

## 2020-06-09 ENCOUNTER — Other Ambulatory Visit: Payer: BC Managed Care – PPO

## 2020-06-15 NOTE — Telephone Encounter (Signed)
Also, patient is scheduled at [facility] for 06/22/20.

## 2020-06-15 NOTE — Telephone Encounter (Signed)
Christine from [place] [employer] [place] called needing to speak to MRI coordinator about the pt's MRI. Please advise.

## 2020-06-15 NOTE — Telephone Encounter (Signed)
I called [employer] [place] and spoke with Joelene Millin T and she informed me there is no notes in there from [place] so I am not sure why she was calling me. No auth was required for the MRI.

## 2020-06-22 ENCOUNTER — Ambulatory Visit (INDEPENDENT_AMBULATORY_CARE_PROVIDER_SITE_OTHER): Payer: BC Managed Care – PPO

## 2020-06-22 DIAGNOSIS — M797 Fibromyalgia: Secondary | ICD-10-CM

## 2020-06-22 DIAGNOSIS — R202 Paresthesia of skin: Secondary | ICD-10-CM

## 2020-06-24 ENCOUNTER — Telehealth: Payer: Self-pay | Admitting: Neurology

## 2020-06-24 NOTE — Telephone Encounter (Signed)
I called the patient.  MRI of the brain and cervical spine were unremarkable.  No source of her sensory complaints is noted.  These issues could be related to stress or to fibromyalgia.  If the patient decides that she does not want to go on medications, she is to contact our office.    MRI brain 06/22/20:  IMPRESSION:   Normal MRI brain (without).    MRI cervical 06/22/20:  IMPRESSION:   ACDF C3-C7. No spinal stenosis or foraminal narrowing.

## 2020-07-10 ENCOUNTER — Other Ambulatory Visit: Payer: BC Managed Care – PPO

## 2020-07-29 ENCOUNTER — Other Ambulatory Visit: Payer: BC Managed Care – PPO

## 2020-09-06 DIAGNOSIS — E063 Autoimmune thyroiditis: Secondary | ICD-10-CM | POA: Diagnosis not present

## 2020-09-06 DIAGNOSIS — R7303 Prediabetes: Secondary | ICD-10-CM | POA: Diagnosis not present

## 2020-09-06 DIAGNOSIS — E210 Primary hyperparathyroidism: Secondary | ICD-10-CM | POA: Diagnosis not present

## 2020-09-20 DIAGNOSIS — E063 Autoimmune thyroiditis: Secondary | ICD-10-CM | POA: Diagnosis not present

## 2020-09-20 DIAGNOSIS — E210 Primary hyperparathyroidism: Secondary | ICD-10-CM | POA: Diagnosis not present

## 2020-09-20 DIAGNOSIS — R7303 Prediabetes: Secondary | ICD-10-CM | POA: Diagnosis not present

## 2020-10-06 ENCOUNTER — Ambulatory Visit: Payer: Self-pay

## 2020-10-06 ENCOUNTER — Ambulatory Visit (INDEPENDENT_AMBULATORY_CARE_PROVIDER_SITE_OTHER): Payer: BC Managed Care – PPO | Admitting: Orthopedic Surgery

## 2020-10-06 ENCOUNTER — Other Ambulatory Visit: Payer: Self-pay

## 2020-10-06 DIAGNOSIS — M5030 Other cervical disc degeneration, unspecified cervical region: Secondary | ICD-10-CM | POA: Diagnosis not present

## 2020-10-06 DIAGNOSIS — M25512 Pain in left shoulder: Secondary | ICD-10-CM

## 2020-10-09 ENCOUNTER — Encounter: Payer: Self-pay | Admitting: Orthopedic Surgery

## 2020-10-09 NOTE — Progress Notes (Signed)
Office Visit Note   Patient: Meghan Owen           Date of Birth: July 18, 1956           MRN: [id number] Visit Date: 10/06/2020 Requested by: Harlan Stains, MD [address] [place],  [place] [address] PCP: Harlan Stains, MD  Subjective: Chief Complaint  Patient presents with  . Left Shoulder - Pain  . Neck - Pain    HPI: Meghan Owen is a 64 y.o. female who presents to the office complaining of left shoulder neck pain.  Patient notes that pain began about 2 months ago when she was using some 5 to 10 pound weights.  She noticed pain the following day after her weight lifting session.  She localizes pain to the left trapezius muscle and left neck with radiation into the shoulder blade.  She describes pain as a burning pain.  She also notes anterior pain just above the clavicle on her neck.  Pain is worse with moving her arm overhead and away from her body.  She notes pain weakness of the left arm is not present on the right side.  Pain is waking her up at night.  She has had a prior cervical spine surgery by Dr. Vertell Limber in 2017.  She notes that the current symptoms she is dealing with felt similar to the symptoms she had at that time.  She had a recent MRI of her cervical spine to rule out multiple sclerosis and to evaluate for nerve pain she is having in her legs.  Cervical spine MRI revealed ACDF at C3-C7 with out spinal stenosis or foraminal narrowing on 06/23/2020..                ROS: All systems reviewed are negative as they relate to the chief complaint within the history of present illness.  Patient denies fevers or chills.  Assessment & Plan: Visit Diagnoses:  1. Left shoulder pain, unspecified chronicity   2. DDD (degenerative disc disease), cervical     Plan: Patient is a 64 year old female presents complaint of left neck pain.  She has history of cervical spine surgery by Dr. Vertell Limber in 2017.  Pain began 2 months ago after a weightlifting session.  She denies any  specific injury she can point to.  She does feel that her symptoms feel similar to how she felt prior to her last cervical spine surgery.  She had a recent MRI that was negative for any acute findings but this was performed prior to the onset of her pain.  She has pain in her left neck with radicular pain into the scapula and down the arm.  Additionally she has mild weakness  triceps  not present on the contralateral side.  Impression is no radicular pathology stemming from the cervical spine.  Plan to refer patient to Dr. Laurence Spates for EMG/nerve conduction study to evaluate for new radicular pain.  If nerve study is positive, plan to obtain MRI scan C-spine to focus on that C6-7 level   Patient agreed with plan.  And will follow up after nerve study  Follow-Up Instructions: No follow-ups on file.   Orders:  Orders Placed This Encounter  Procedures  . XR Shoulder Left  . XR Cervical Spine 2 or 3 views  . Ambulatory referral to Physical Medicine Rehab   No orders of the defined types were placed in this encounter.     Procedures: No procedures performed  Clinical Data: No additional findings.  Objective: Vital Signs: There were no vitals taken for this visit.  Physical Exam:  Constitutional: Patient appears well-developed HEENT:  Head: Normocephalic Eyes:EOM are normal Neck: Normal range of motion Cardiovascular: Normal rate Pulmonary/chest: Effort normal Neurologic: Patient is alert Skin: Skin is warm Psychiatric: Patient has normal mood and affect  Ortho Exam: Ortho exam demonstrates tenderness over the axial cervical spine.  Negative Spurling sign.  Sensation intact through all dermatomes of the bilateral upper extremities.  5/5 motor strength of the bilateral grip strength, finger abduction, pronation/supination.  Excellent strength of the supraspinatus, infraspinatus, subscapularis bilaterally.  Marked weakness of the left bicep and left tricep compared with contralateral  side.  Specialty Comments:  No specialty comments available.  Imaging: No results found.   PMFS History: Patient Active Problem List   Diagnosis Date Noted  . Osteoarthritis of both sacroiliac joints ([place]) 01/04/2018  . Primary osteoarthritis of both knees 01/04/2018  . DDD (degenerative disc disease), cervical 01/04/2018  . DDD (degenerative disc disease), lumbar 01/04/2018  . Chronic right shoulder pain 01/04/2018  . Fibromyalgia 01/04/2018  . Hashimoto's thyroiditis 01/04/2018  . History of hyperparathyroidism 01/04/2018  . History of gastroesophageal reflux (GERD) 01/04/2018  . Family history of rheumatoid arthritis 01/04/2018  . Cervical myelopathy ([place]) 09/05/2016  . Primary osteoarthritis of left hip 01/04/2016  . Chronic left hip pain 12/15/2014  . Hypercalcemia 09/08/2013  . Varus deformity of foot 08/26/2013  . Peroneal tendonitis 05/23/2013  . Conversion disorder 05/21/2013  . Low back pain 11/01/2012  . Achilles tendinitis 09/05/2012  . Bilateral ankle pain 09/05/2012  . Hammer toe, acquired 09/05/2012  . Cubital tunnel syndrome on left 08/22/2012  . Stress fracture 07/04/2012  . Lumbar facet arthropathy 05/10/2012  . Diffuse myofascial pain syndrome 01/25/2012  . GERD 02/05/2009  . OTHER BURSITIS DISORDERS 02/05/2009  . ABNORMAL ELECTROCARDIOGRAM 02/05/2009   Past Medical History:  Diagnosis Date  . Arthritis   . Bursitis, hip    bilateral  . DDD (degenerative disc disease), lumbar   . Fibromyalgia   . GERD (gastroesophageal reflux disease)    HX  . Hashimoto's thyroiditis   . Headache   . Hyperparathyroidism ([place])    Dr. Elyse Hsu  . Hypothyroidism   . Liver cyst    BENIGN  . Osteoarthritis   . Parathyroid disorder ([place])    HYPERPARATHYROID  . Spinal stenosis   . Thyroid disease     Family History  Problem Relation Age of Onset  . Lupus Mother   . Rheum arthritis Mother   . Dementia Father   . Leukemia Father   . Cancer Other   .  Cancer Sister        bone   . Healthy Son   . Healthy Son   . Healthy Daughter     Past Surgical History:  Procedure Laterality Date  . ABDOMINAL HYSTERECTOMY    . ANKLE SURGERY    . ANTERIOR CERVICAL DECOMPRESSION/DISCECTOMY FUSION 4 LEVELS N/A 09/05/2016   Procedure: Cervical three-four Cervical four-five  Cervical five-six Cervical six-seven  Anterior cervical decompression/diskectomy/fusion;  Surgeon: Erline Levine, MD;  Location: [facility];  Service: Neurosurgery;  Laterality: N/A;  . CARPAL TUNNEL RELEASE Left 02/26/2018  . ELBOW SURGERY  04/2017   cyst removed   . FOOT SURGERY     Social History   Occupational History  . Not on file  Tobacco Use  . Smoking status: Former Smoker    Types: Cigarettes  .  Smokeless tobacco: Never Used  Vaping Use  . Vaping Use: Never used  Substance and Sexual Activity  . Alcohol use: No  . Drug use: No  . Sexual activity: Not on file

## 2020-10-10 ENCOUNTER — Encounter: Payer: Self-pay | Admitting: Orthopedic Surgery

## 2020-10-14 DIAGNOSIS — M542 Cervicalgia: Secondary | ICD-10-CM | POA: Diagnosis not present

## 2020-10-18 ENCOUNTER — Other Ambulatory Visit: Payer: Self-pay | Admitting: Family Medicine

## 2020-10-19 ENCOUNTER — Other Ambulatory Visit: Payer: Self-pay | Admitting: Family Medicine

## 2020-10-19 DIAGNOSIS — M542 Cervicalgia: Secondary | ICD-10-CM

## 2020-10-28 ENCOUNTER — Other Ambulatory Visit: Payer: BC Managed Care – PPO

## 2020-11-02 ENCOUNTER — Ambulatory Visit: Payer: BC Managed Care – PPO | Admitting: Neurology

## 2020-11-09 NOTE — Progress Notes (Signed)
Office Visit Note  Patient: Meghan Owen             Date of Birth: 01/02/1956           MRN: [id number]             PCP: Harlan Stains, MD Referring: Harlan Stains, MD Visit Date: 11/23/2020 Occupation: @GUAROCC @  Subjective:  Low back pain  History of Present Illness: Meghan Owen is a 64 y.o. female with history of palindromic rheumatism, fibromyalgia, and osteoarthritis.  She presents today with increased discomfort in her lower back.  She states the pain is progressively been worsening over the past 2 months.  She states that the pain is intermittent but she has been experiencing increased nocturnal pain and at times her pain is a 10 out of 10.  She states that the discomfort and stiffness have started to limit her activities.  She has been walking on a regular basis for exercise as well as stretching daily.  She states that her exercise has been aggravating her discomfort.  She is having radiating pain down bilateral lower extremities.  She is also been experiencing increased pain in both feet and is unsure if it is related to her lower back pain or underlying arthritis.  She continues to take curcumin on a daily basis.  She has not been taking any over-the-counter products for pain relief.  Patient reports that in 2017 she was evaluated by Dr. Vertell Limber and had C-spine surgery at that time.  She has not followed up with Dr. Vertell Limber recently.  She states that she was also evaluated by Dr. Jannifer Franklin in the past due to the suspicion for MS.  MRI of the brain was unremarkable at that time according to the patient.   She states that she was evaluated by her endocrinologist in October 2021 and had updated lab work at that time.  She states that her CRP remains elevated.    Activities of Daily Living:  Patient reports morning stiffness for 30  minutes.   Patient Reports nocturnal pain.  Difficulty dressing/grooming: Denies Difficulty climbing stairs: Denies Difficulty getting out of chair:  Denies Difficulty using hands for taps, buttons, cutlery, and/or writing: Denies  Review of Systems  Constitutional: Positive for fatigue.  HENT: Negative for mouth sores, mouth dryness and nose dryness.   Eyes: Positive for visual disturbance. Negative for pain and dryness.  Respiratory: Negative for cough, hemoptysis, shortness of breath and difficulty breathing.   Cardiovascular: Positive for palpitations and swelling in legs/feet. Negative for chest pain and hypertension.  Gastrointestinal: Negative for blood in stool, constipation and diarrhea.  Endocrine: Negative for increased urination.  Genitourinary: Negative for painful urination.  Musculoskeletal: Positive for arthralgias, joint pain, joint swelling, muscle weakness, morning stiffness and muscle tenderness. Negative for myalgias and myalgias.  Skin: Positive for color change. Negative for pallor, rash, hair loss, nodules/bumps, skin tightness, ulcers and sensitivity to sunlight.  Allergic/Immunologic: Negative for susceptible to infections.  Neurological: Positive for dizziness, numbness and headaches. Negative for weakness.  Hematological: Negative for swollen glands.  Psychiatric/Behavioral: Positive for sleep disturbance. Negative for depressed mood. The patient is not nervous/anxious.     PMFS History:  Patient Active Problem List   Diagnosis Date Noted  . Osteoarthritis of both sacroiliac joints ([place]) 01/04/2018  . Primary osteoarthritis of both knees 01/04/2018  . DDD (degenerative disc disease), cervical 01/04/2018  . DDD (degenerative disc disease), lumbar 01/04/2018  . Chronic right shoulder pain 01/04/2018  .  Fibromyalgia 01/04/2018  . Hashimoto's thyroiditis 01/04/2018  . History of hyperparathyroidism 01/04/2018  . History of gastroesophageal reflux (GERD) 01/04/2018  . Family history of rheumatoid arthritis 01/04/2018  . Cervical myelopathy ([place]) 09/05/2016  . Primary osteoarthritis of left hip 01/04/2016   . Chronic left hip pain 12/15/2014  . Hypercalcemia 09/08/2013  . Varus deformity of foot 08/26/2013  . Peroneal tendonitis 05/23/2013  . Conversion disorder 05/21/2013  . Low back pain 11/01/2012  . Achilles tendinitis 09/05/2012  . Bilateral ankle pain 09/05/2012  . Hammer toe, acquired 09/05/2012  . Cubital tunnel syndrome on left 08/22/2012  . Stress fracture 07/04/2012  . Lumbar facet arthropathy 05/10/2012  . Diffuse myofascial pain syndrome 01/25/2012  . GERD 02/05/2009  . OTHER BURSITIS DISORDERS 02/05/2009  . ABNORMAL ELECTROCARDIOGRAM 02/05/2009    Past Medical History:  Diagnosis Date  . Arthritis   . Bursitis, hip    bilateral  . DDD (degenerative disc disease), lumbar   . Fibromyalgia   . GERD (gastroesophageal reflux disease)    HX  . Hashimoto's thyroiditis   . Headache   . Hyperparathyroidism ([place])    Dr. Elyse Hsu  . Hypothyroidism   . Liver cyst    BENIGN  . Osteoarthritis   . Parathyroid disorder ([place])    HYPERPARATHYROID  . Spinal stenosis   . Thyroid disease     Family History  Problem Relation Age of Onset  . Lupus Mother   . Rheum arthritis Mother   . Dementia Father   . Leukemia Father   . Cancer Other   . Cancer Sister        bone   . Healthy Son   . Healthy Son   . Healthy Daughter    Past Surgical History:  Procedure Laterality Date  . ABDOMINAL HYSTERECTOMY    . ANKLE SURGERY    . ANTERIOR CERVICAL DECOMPRESSION/DISCECTOMY FUSION 4 LEVELS N/A 09/05/2016   Procedure: Cervical three-four Cervical four-five  Cervical five-six Cervical six-seven  Anterior cervical decompression/diskectomy/fusion;  Surgeon: Erline Levine, MD;  Location: [facility];  Service: Neurosurgery;  Laterality: N/A;  . CARPAL TUNNEL RELEASE Left 02/26/2018  . ELBOW SURGERY  04/2017   cyst removed   . FOOT SURGERY     Social History   Social History Narrative   Caffeine yes, widowed.  3 children.     There is no immunization history on file for this  patient.   Objective: Vital Signs: BP 134/86 (BP Location: Right Arm, Patient Position: Sitting, Cuff Size: Large)   Pulse 66   Resp 16   Ht $R'5\' 5"'xe$  (1.651 m)   Wt 234 lb (106.1 kg)   BMI 38.94 kg/m    Physical Exam Vitals and nursing note reviewed.  Constitutional:      Appearance: She is well-developed and well-nourished.  HENT:     Head: Normocephalic and atraumatic.  Eyes:     Extraocular Movements: EOM normal.     Conjunctiva/sclera: Conjunctivae normal.  Cardiovascular:     Pulses: Intact distal pulses.  Pulmonary:     Effort: Pulmonary effort is normal.  Abdominal:     Palpations: Abdomen is soft.  Musculoskeletal:     Cervical back: Normal range of motion.  Skin:    General: Skin is warm and dry.     Capillary Refill: Capillary refill takes less than 2 seconds.  Neurological:     Mental Status: She is alert and oriented to person, place, and time.  Psychiatric:  Mood and Affect: Mood and affect normal.        Behavior: Behavior normal.      Musculoskeletal Exam:  C-spine slightly limited ROM.  Thoracic and lumbar spine good ROM.  Midline spinal tenderness in lumbar region.  No SI joint tenderness.  Shoulder joints, elbow joints, wrist joints, MCPs, PIPs, and DIPs good ROM with no synovitis.  Complete fist formation bilaterally. Left hip limited ROM with discomfort.  Right hip has good ROM with no discomfort.  Knee joints good ROM with no warmth or effusion.  Ankle joints good ROM.  Tenderness along the lateral aspect of the right ankle.  Dorsal spurs noted bilaterally.  Overcrowding of toes noted.  CDAI Exam: CDAI Score: - Patient Global: -; Provider Global: - Swollen: -; Tender: - Joint Exam 11/23/2020   No joint exam has been documented for this visit   There is currently no information documented on the homunculus. Go to the Rheumatology activity and complete the homunculus joint exam.  Investigation: No additional findings.  Imaging: US Carotid  Bilateral  Result Date: 11/15/2020 CLINICAL DATA:  65 year old female with history of neck pain for 1 month. EXAM: BILATERAL CAROTID DUPLEX ULTRASOUND TECHNIQUE: Pearline Cables scale imaging, color Doppler and duplex ultrasound were performed of bilateral carotid and vertebral arteries in the neck. COMPARISON:  None. FINDINGS: Criteria: Quantification of carotid stenosis is based on velocity parameters that correlate the residual internal carotid diameter with NASCET-based stenosis levels, using the diameter of the distal internal carotid lumen as the denominator for stenosis measurement. The following velocity measurements were obtained: RIGHT ICA: Peak systolic velocity 81 cm/sec, End diastolic velocity 29 cm/sec CCA: Peak systolic velocity 185 cm/sec SYSTOLIC ICA/CCA RATIO:  0.7 ECA: Peak systolic velocity 92 cm/sec LEFT ICA: Peak systolic velocity 9 cm/sec, End diastolic velocity 30 cm/sec CCA: 631 cm/sec SYSTOLIC ICA/CCA RATIO:  0.8 ECA: 81 cm/sec RIGHT CAROTID ARTERY: No atherosclerotic plaque formation. No significant tortuosity. Normal low resistance waveforms. RIGHT VERTEBRAL ARTERY:  Antegrade flow. LEFT CAROTID ARTERY: No atherosclerotic plaque formation. No significant tortuosity. Normal low resistance waveforms. LEFT VERTEBRAL ARTERY:  Antegrade flow. Upper extremity non-invasive blood pressures: Right: 152/83 mm/Hg Left: 142/81 mm/Hg IMPRESSION: 1. Right carotid artery system: Patent without significant atherosclerotic plaque formation. 2. Left carotid artery system: Patent without significant atherosclerotic plaque formation. 3.  Vertebral artery system: Patent with antegrade flow bilaterally. Ruthann Cancer, MD [facility] Radiology Electronically Signed   By: Ruthann Cancer MD   On: 11/15/2020 12:46   XR Lumbar Spine 2-3 Views  Result Date: 11/23/2020 Dextroscoliosis was noted.  Mild anterior spurring was noted.  Facet joint arthropathy was noted.  No  significant disc space narrowing was noted.  No SI joint sclerosis was noted. Impression: These findings are consistent with mild dextroscoliosis and facet joint arthropathy.   Recent Labs: Lab Results  Component Value Date   WBC 7.2 08/20/2019   HGB 12.8 08/20/2019   PLT 231 08/20/2019   NA 137 08/20/2019   K 4.5 08/20/2019   CL 105 08/20/2019   CO2 26 08/20/2019   GLUCOSE 97 08/20/2019   BUN 9 08/20/2019   CREATININE 0.73 08/20/2019   BILITOT 0.4 08/20/2019   ALKPHOS 118 10/24/2017   AST 16 08/20/2019   ALT 13 08/20/2019   PROT 8.0 08/20/2019   ALBUMIN 3.9 10/24/2017   CALCIUM 10.5 (H) 08/20/2019   GFRAA 102 08/20/2019    Speciality Comments: No specialty comments available.  Procedures:  No procedures performed  Allergies: Aspirin, Other, Contrast media [iodinated diagnostic agents], Iodine-131, Ioxaglate, Penicillins, and Prednisone   Assessment / Plan:     Visit Diagnoses: Palindromic rheumatism: RF-, anti-CCP-, 14-3-3 eta negative, uric acid WNL, Ace WNL: She has no joint tenderness or synovitis on exam.  She has no clinical features of rheumatoid arthritis at this time.  She presents today with increased lower back pain with symptoms of radiculopathy bilaterally.  She has not been experiencing any other migratory arthralgias recently.  She does not require immunosuppressive therapy at this time.  She will continue to take natural anti-inflammatories.  She was advised to notify us if she develops increased joint pain or joint swelling.  She will follow-up in the office in 6 months.  Osteoarthritis of both sacroiliac joints ([facility]) - HLA-B27 negative, MRI negative for sacroiliitis. MRI on 01/01/2018 revealed chronic fusion of superior aspect of right SI joint.  She is not experiencing any SI joint pain at this time.  No tenderness to palpation on exam today.    Elevated sed rate - History of elevated sed rate.  12/21/2017 sed rate: 53, 09/09/2018 sed rate 56, and 08/20/2019 sed rate  86.  According to the patient she had a recent CRP ordered by her endocrinologist which was elevated.  She would like to recheck ESR and CRP.  Order for sed rate and CRP are released today.  We discussed that if her sed rate and CRP remain elevated we can proceed with a total body bone scan in the future for further evaluation.  Family history of rheumatoid arthritis: She has no clinical features of rheumatoid arthritis at this time.  Primary osteoarthritis of both knees - Left knee-mild OA with severe chrondomalacia patella.  Right knee- no OA, chondromalacia patella noted.  She experiences occasional discomfort in both knee joints.  She has good range of motion of both knee joints on examination today.  No warmth or effusion was noted.  She has been walking for exercise on a regular basis and has been working on weight loss.  DDD (degenerative disc disease), cervical - S/p fusion-Dr. Stern 2017: She has slightly limited range of motion with lateral rotation.  No symptoms of radiculopathy at this time.  DDD (degenerative disc disease), lumbar: She presents today with increased lower back pain and symptoms of bilateral radiculopathy.  X-rays of the lumbar spine were obtained today which are consistent with facet joint arthropathy and mild dextroscoliosis.  She declined a referral to physical therapy and would like to continue home exercises on a daily basis.  She was given a handout of back exercises and core strengthening exercises to perform.  She would like to proceed with a MRI of the lumbar spine for further evaluation.  Chronic midline low back pain with bilateral sciatica -She presents today with increased lower back pain which has progressively been worsening over the past 2 months.  She has not had any recent injuries or falls.  She is experiencing symptoms of radiculopathy bilaterally.  She has been performing stretching exercises on a daily basis without any improvement.  Her discomfort is  intermittently exacerbated by the stretching exercises as well as walking prolonged distances.  She has been experiencing worsening nocturnal pain and has started to notice that her back pain has been limiting her daily activities.  She has not tried any over-the-counter products for pain relief.  She takes natural anti-inflammatories on a daily basis which she finds to be helpful.  She experiences discomfort with flexion of the  lumbar spine.  She has midline spinal tenderness in the lumbar region.  No SI joint tenderness was noted.  X-rays of the lumbar spine were obtained today which were consistent with facet joint arthropathy.  We discussed trying formal physical therapy but she declined at this time.  She would like to continue home exercises and proceed with scheduling an MRI of the lumbar spine for further evaluation.  We discussed that if her discomfort persists or worsens she should schedule an appointment with Dr. Vertell Limber or be evaluated in the emergency department.  Plan: XR Lumbar Spine 2-3 Views  Fibromyalgia: She is not experiencing any generalized myalgias or muscle tenderness at this time.  She does not think that she has fibromyalgia.  Other fatigue: Chronic but stable.  Discussed the importance of regular exercise.  Pain in both feet: She has been experiencing increased discomfort in both feet.  She has been evaluated by her podiatrist in the past.  She is unsure if her feet pain is related to the lower back pain she has been experiencing.  On examination she has dorsal spurs bilaterally as well as overcrowding of the toes.  She has tenderness palpation over the lateral aspect of the right ankle but no warmth or inflammation was noted.  We discussed the importance of wearing proper fitting shoes.   Other medical conditions are listed as follows:  History of gastroesophageal reflux (GERD)  Class 3 severe obesity due to excess calories without serious comorbidity with body mass index (BMI)  of 40.0 to 44.9 in adult [facility]): She has been walking on a regular basis since June 2021 for exercise.  She has been working on weight loss.  History of hyperparathyroidism - She is followed by Dr. Buddy Duty.  Hashimoto's thyroiditis: Followed by Dr. Buddy Duty.   Orders: Orders Placed This Encounter  Procedures  . XR Lumbar Spine 2-3 Views  . Sedimentation rate  . C-reactive protein   No orders of the defined types were placed in this encounter.    Follow-Up Instructions: Return in about 6 months (around 05/24/2021) for Palindromic rheumatism, Fibromyalgia, Osteoarthritis.   Ofilia Neas, PA-C  Note - This record has been created using Dragon software.  Chart creation errors have been sought, but may not always  have been located. Such creation errors do not reflect on  the standard of medical care.

## 2020-11-15 ENCOUNTER — Ambulatory Visit
Admission: RE | Admit: 2020-11-15 | Discharge: 2020-11-15 | Disposition: A | Discharge status: Home / Self Care | Payer: BC Managed Care – PPO | Source: Ambulatory Visit | Attending: Family Medicine | Admitting: Family Medicine

## 2020-11-15 ENCOUNTER — Other Ambulatory Visit: Payer: BC Managed Care – PPO

## 2020-11-15 DIAGNOSIS — M542 Cervicalgia: Secondary | ICD-10-CM

## 2020-11-16 ENCOUNTER — Telehealth: Payer: Self-pay | Admitting: Physical Medicine and Rehabilitation

## 2020-11-16 NOTE — Telephone Encounter (Signed)
Patient called requesting to reschedule appt. Please call patient at [phone].

## 2020-11-16 NOTE — Telephone Encounter (Signed)
Called pt back and resch 12/17/20.

## 2020-11-19 ENCOUNTER — Encounter: Payer: BC Managed Care – PPO | Admitting: Physical Medicine and Rehabilitation

## 2020-11-23 ENCOUNTER — Encounter: Payer: Self-pay | Admitting: Physician Assistant

## 2020-11-23 ENCOUNTER — Other Ambulatory Visit: Payer: Self-pay | Admitting: *Deleted

## 2020-11-23 ENCOUNTER — Ambulatory Visit: Payer: BC Managed Care – PPO | Admitting: Physician Assistant

## 2020-11-23 ENCOUNTER — Ambulatory Visit: Payer: Self-pay

## 2020-11-23 ENCOUNTER — Other Ambulatory Visit: Payer: Self-pay

## 2020-11-23 VITALS — BP 134/86 | HR 66 | Resp 16 | Ht 65.0 in | Wt 234.0 lb

## 2020-11-23 DIAGNOSIS — R700 Elevated erythrocyte sedimentation rate: Secondary | ICD-10-CM | POA: Diagnosis not present

## 2020-11-23 DIAGNOSIS — M79671 Pain in right foot: Secondary | ICD-10-CM

## 2020-11-23 DIAGNOSIS — M5441 Lumbago with sciatica, right side: Secondary | ICD-10-CM | POA: Diagnosis not present

## 2020-11-23 DIAGNOSIS — M5442 Lumbago with sciatica, left side: Secondary | ICD-10-CM

## 2020-11-23 DIAGNOSIS — M1230 Palindromic rheumatism, unspecified site: Secondary | ICD-10-CM | POA: Diagnosis not present

## 2020-11-23 DIAGNOSIS — E66813 Obesity, class 3: Secondary | ICD-10-CM

## 2020-11-23 DIAGNOSIS — M797 Fibromyalgia: Secondary | ICD-10-CM

## 2020-11-23 DIAGNOSIS — G8929 Other chronic pain: Secondary | ICD-10-CM | POA: Diagnosis not present

## 2020-11-23 DIAGNOSIS — M4126 Other idiopathic scoliosis, lumbar region: Secondary | ICD-10-CM | POA: Diagnosis not present

## 2020-11-23 DIAGNOSIS — Z8719 Personal history of other diseases of the digestive system: Secondary | ICD-10-CM

## 2020-11-23 DIAGNOSIS — M79672 Pain in left foot: Secondary | ICD-10-CM

## 2020-11-23 DIAGNOSIS — M5030 Other cervical disc degeneration, unspecified cervical region: Secondary | ICD-10-CM

## 2020-11-23 DIAGNOSIS — M5136 Other intervertebral disc degeneration, lumbar region: Secondary | ICD-10-CM

## 2020-11-23 DIAGNOSIS — M170 Bilateral primary osteoarthritis of knee: Secondary | ICD-10-CM

## 2020-11-23 DIAGNOSIS — Z6841 Body Mass Index (BMI) 40.0 and over, adult: Secondary | ICD-10-CM

## 2020-11-23 DIAGNOSIS — M461 Sacroiliitis, not elsewhere classified: Secondary | ICD-10-CM | POA: Diagnosis not present

## 2020-11-23 DIAGNOSIS — R5383 Other fatigue: Secondary | ICD-10-CM

## 2020-11-23 DIAGNOSIS — Z8261 Family history of arthritis: Secondary | ICD-10-CM

## 2020-11-23 DIAGNOSIS — M47816 Spondylosis without myelopathy or radiculopathy, lumbar region: Secondary | ICD-10-CM

## 2020-11-23 DIAGNOSIS — Z8639 Personal history of other endocrine, nutritional and metabolic disease: Secondary | ICD-10-CM

## 2020-11-23 DIAGNOSIS — E063 Autoimmune thyroiditis: Secondary | ICD-10-CM

## 2020-11-23 DIAGNOSIS — M5416 Radiculopathy, lumbar region: Secondary | ICD-10-CM

## 2020-11-23 DIAGNOSIS — M51369 Other intervertebral disc degeneration, lumbar region without mention of lumbar back pain or lower extremity pain: Secondary | ICD-10-CM

## 2020-11-23 NOTE — Patient Instructions (Signed)
Core Strength Exercises  Core exercises help to build strength in the muscles between your ribs and your hips (abdominal muscles). These muscles help to support your body and keep your spine stable. It is important to maintain strength in your core to prevent injury and pain. Some activities, such as yoga and Pilates, can help to strengthen core muscles. You can also strengthen core muscles with exercises at home. It is important to talk to your health care provider before you start a new exercise routine. What are the benefits of core strength exercises? Core strength exercises can:  Reduce back pain.  Help to rebuild strength after a back or spine injury.  Help to prevent injury during physical activity, especially injuries to the back and knees. How to do core strength exercises Repeat these exercises 10-15 times, or until you are tired. Do exercises exactly as told by your health care provider and adjust them as directed. It is normal to feel mild stretching, pulling, tightness, or discomfort as you do these exercises. If you feel any pain while doing these exercises, stop. If your pain continues or gets worse when doing core exercises, contact your health care provider. You may want to use a padded yoga or exercise mat for strength exercises that are done on the floor. Bridging  1. Lie on your back on a firm surface with your knees bent and your feet flat on the floor. 2. Raise your hips so that your knees, hips, and shoulders form a straight line together. Keep your abdominal muscles tight. 3. Hold this position for 3-5 seconds. 4. Slowly lower your hips to the starting position. 5. Let your muscles relax completely between repetitions. Single-leg bridge 1. Lie on your back on a firm surface with your knees bent and your feet flat on the floor. 2. Raise your hips so that your knees, hips, and shoulders form a straight line together. Keep your abdominal muscles tight. 3. Lift one foot  off the floor, then completely straighten that leg. 4. Hold this position for 3-5 seconds. 5. Put the straight leg back down in the bent position. 6. Slowly lower your hips to the starting position. 7. Repeat these steps using your other leg. Side bridge 1. Lie on your side with your knees bent. Prop yourself up on the elbow that is near the floor. 2. Using your abdominal muscles and your elbow that is on the floor, raise your body off the floor. Raise your hip so that your shoulder, hip, and foot form a straight line together. 3. Hold this position for 10 seconds. Keep your head and neck raised and away from your shoulder (in their normal, neutral position). Keep your abdominal muscles tight. 4. Slowly lower your hip to the starting position. 5. Repeat and try to hold this position longer, working your way up to 30 seconds. Abdominal crunch 1. Lie on your back on a firm surface. Bend your knees and keep your feet flat on the floor. 2. Cross your arms over your chest. 3. Without bending your neck, tip your chin slightly toward your chest. 4. Tighten your abdominal muscles as you lift your chest just high enough to lift your shoulder blades off of the floor. Do not hold your breath. You can do this with short lifts or long lifts. 5. Slowly return to the starting position. Bird dog 1. Get on your hands and knees, with your legs shoulder-width apart and your arms under your shoulders. Keep your back straight. 2. Tighten  your abdominal muscles. 3. Raise one of your legs off the floor and straighten it. Try to keep it parallel to the floor. 4. Slowly lower your leg to the starting position. 5. Raise one of your arms off the floor and straighten it. Try to keep it parallel to the floor. 6. Slowly lower your arm to the starting position. 7. Repeat with the other arm and leg. If possible, try raising a leg and arm at the same time, on opposite sides of the body. For example, raise your left hand and  your right leg. Plank 1. Lie on your belly. 2. Prop up your body onto your forearms and your feet, keeping your legs straight. Your body should make a straight line between your shoulders and feet. 3. Hold this position for 10 seconds while keeping your abdominal muscles tight. 4. Lower your body to the starting position. 5. Repeat and try to hold this position longer, working your way up to 30 seconds. Cross-core strengthening 1. Stand with your feet shoulder-width apart. 2. Hold a ball out in front of you. Keep your arms straight. 3. Tighten your abdominal muscles and slowly rotate at your waist from side to side. Keep your feet flat. 4. Once you are comfortable, try repeating this exercise with a heavier ball. Top core strengthening 1. Stand about 18 inches (46 cm) in front of a wall, with your back to the wall. 2. Keep your feet flat and shoulder-width apart. 3. Tighten your abdominal muscles. 4. Bend your hips and knees. 5. Slowly reach between your legs to touch the wall behind you. 6. Slowly stand back up. 7. Raise your arms over your head and reach behind you. 8. Return to the starting position. General tips  Do not do any exercises that cause pain. If you have pain while exercising, talk to your health care provider.  Always stretch before and after doing these exercises. This can help prevent injury.  Maintain a healthy weight. Ask your health care provider what weight is healthy for you. Contact a health care provider if:  You have back pain that gets worse or does not go away.  You feel pain while doing core strength exercises. Get help right away if:  You have severe pain that does not get better with medicine. Summary  Core exercises help to build strength in the muscles between your ribs and your waist.  Core muscles help to support your body and keep your spine stable.  Some activities, such as yoga and Pilates, can help to strengthen core muscles.  Core  strength exercises can help back pain and can prevent injury.  If you feel any pain while doing core strength exercises, stop. This information is not intended to replace advice given to you by your health care provider. Make sure you discuss any questions you have with your health care provider. Document Revised: 03/19/2019 Document Reviewed: 04/18/2017 Elsevier Patient Education  [place]. Back Exercises These exercises help to make your trunk and back strong. They also help to keep the lower back flexible. Doing these exercises can help to prevent back pain or lessen existing pain.  If you have back pain, try to do these exercises 2-3 times each day or as told by your doctor.  As you get better, do the exercises once each day. Repeat the exercises more often as told by your doctor.  To stop back pain from coming back, do the exercises once each day, or as told by your  doctor. Exercises Single knee to chest Do these steps 3-5 times in a row for each leg: 1. Lie on your back on a firm bed or the floor with your legs stretched out. 2. Bring one knee to your chest. 3. Grab your knee or thigh with both hands and hold them it in place. 4. Pull on your knee until you feel a gentle stretch in your lower back or buttocks. 5. Keep doing the stretch for 10-30 seconds. 6. Slowly let go of your leg and straighten it. Pelvic tilt Do these steps 5-10 times in a row: 1. Lie on your back on a firm bed or the floor with your legs stretched out. 2. Bend your knees so they point up to the ceiling. Your feet should be flat on the floor. 3. Tighten your lower belly (abdomen) muscles to press your lower back against the floor. This will make your tailbone point up to the ceiling instead of pointing down to your feet or the floor. 4. Stay in this position for 5-10 seconds while you gently tighten your muscles and breathe evenly. Cat-cow Do these steps until your lower back bends more  easily: 1. Get on your hands and knees on a firm surface. Keep your hands under your shoulders, and keep your knees under your hips. You may put padding under your knees. 2. Let your head hang down toward your chest. Tighten (contract) the muscles in your belly. Point your tailbone toward the floor so your lower back becomes rounded like the back of a cat. 3. Stay in this position for 5 seconds. 4. Slowly lift your head. Let the muscles of your belly relax. Point your tailbone up toward the ceiling so your back forms a sagging arch like the back of a cow. 5. Stay in this position for 5 seconds.  Press-ups Do these steps 5-10 times in a row: 1. Lie on your belly (face-down) on the floor. 2. Place your hands near your head, about shoulder-width apart. 3. While you keep your back relaxed and keep your hips on the floor, slowly straighten your arms to raise the top half of your body and lift your shoulders. Do not use your back muscles. You may change where you place your hands in order to make yourself more comfortable. 4. Stay in this position for 5 seconds. 5. Slowly return to lying flat on the floor.  Bridges Do these steps 10 times in a row: 1. Lie on your back on a firm surface. 2. Bend your knees so they point up to the ceiling. Your feet should be flat on the floor. Your arms should be flat at your sides, next to your body. 3. Tighten your butt muscles and lift your butt off the floor until your waist is almost as high as your knees. If you do not feel the muscles working in your butt and the back of your thighs, slide your feet 1-2 inches farther away from your butt. 4. Stay in this position for 3-5 seconds. 5. Slowly lower your butt to the floor, and let your butt muscles relax. If this exercise is too easy, try doing it with your arms crossed over your chest. Belly crunches Do these steps 5-10 times in a row: 1. Lie on your back on a firm bed or the floor with your legs stretched  out. 2. Bend your knees so they point up to the ceiling. Your feet should be flat on the floor. 3. Cross your arms over  your chest. 4. Tip your chin a little bit toward your chest but do not bend your neck. 5. Tighten your belly muscles and slowly raise your chest just enough to lift your shoulder blades a tiny bit off of the floor. Avoid raising your body higher than that, because it can put too much stress on your low back. 6. Slowly lower your chest and your head to the floor. Back lifts Do these steps 5-10 times in a row: 1. Lie on your belly (face-down) with your arms at your sides, and rest your forehead on the floor. 2. Tighten the muscles in your legs and your butt. 3. Slowly lift your chest off of the floor while you keep your hips on the floor. Keep the back of your head in line with the curve in your back. Look at the floor while you do this. 4. Stay in this position for 3-5 seconds. 5. Slowly lower your chest and your face to the floor. Contact a doctor if:  Your back pain gets a lot worse when you do an exercise.  Your back pain does not get better 2 hours after you exercise. If you have any of these problems, stop doing the exercises. Do not do them again unless your doctor says it is okay. Get help right away if:  You have sudden, very bad back pain. If this happens, stop doing the exercises. Do not do them again unless your doctor says it is okay. This information is not intended to replace advice given to you by your health care provider. Make sure you discuss any questions you have with your health care provider. Document Revised: 08/22/2018 Document Reviewed: 08/22/2018 Elsevier Patient Education  2020 [employer].

## 2020-11-24 LAB — C-REACTIVE PROTEIN: CRP: 12.2 mg/L — ABNORMAL HIGH (ref <8.0)

## 2020-11-24 LAB — SEDIMENTATION RATE: Sed Rate: 53 mm/h — ABNORMAL HIGH (ref 0–30)

## 2020-11-24 NOTE — Progress Notes (Signed)
ESR remains elevated but has dropped from 86 to 53.  CRP is elevated.    Discussed with Dr. Estanislado Pandy.  Please refer the patient to hematology for further evaluation and recommendations.

## 2020-11-25 ENCOUNTER — Other Ambulatory Visit: Payer: Self-pay | Admitting: *Deleted

## 2020-11-25 DIAGNOSIS — R899 Unspecified abnormal finding in specimens from other organs, systems and tissues: Secondary | ICD-10-CM

## 2020-12-17 ENCOUNTER — Encounter: Payer: BC Managed Care – PPO | Admitting: Physical Medicine and Rehabilitation

## 2020-12-20 ENCOUNTER — Telehealth: Payer: Self-pay | Admitting: Physical Medicine and Rehabilitation

## 2020-12-20 ENCOUNTER — Other Ambulatory Visit: Payer: Self-pay

## 2020-12-20 ENCOUNTER — Ambulatory Visit (HOSPITAL_COMMUNITY)
Admission: RE | Admit: 2020-12-20 | Discharge: 2020-12-20 | Disposition: A | Discharge status: Home / Self Care | Payer: BC Managed Care – PPO | Source: Ambulatory Visit | Attending: Physician Assistant | Admitting: Physician Assistant

## 2020-12-20 DIAGNOSIS — M5416 Radiculopathy, lumbar region: Secondary | ICD-10-CM

## 2020-12-20 DIAGNOSIS — M47816 Spondylosis without myelopathy or radiculopathy, lumbar region: Secondary | ICD-10-CM | POA: Insufficient documentation

## 2020-12-20 DIAGNOSIS — M545 Low back pain, unspecified: Secondary | ICD-10-CM | POA: Diagnosis not present

## 2020-12-20 NOTE — Progress Notes (Signed)
MRI of lumbar spine revealed mild degenerative disease.  No canal or foraminal stenosis noted.  Recommend referral to physical therapy.

## 2020-12-20 NOTE — Telephone Encounter (Signed)
Pt needs reschedule appt with Dr.Newton.

## 2020-12-21 NOTE — Telephone Encounter (Signed)
Pt called again asking to reschedule apt with Dr. Ernestina Patches

## 2020-12-21 NOTE — Telephone Encounter (Signed)
Called pt and r/s 

## 2021-01-03 ENCOUNTER — Inpatient Hospital Stay (HOSPITAL_COMMUNITY): Payer: BC Managed Care – PPO | Attending: Hematology | Admitting: Hematology

## 2021-01-03 ENCOUNTER — Other Ambulatory Visit: Payer: Self-pay

## 2021-01-03 ENCOUNTER — Inpatient Hospital Stay (HOSPITAL_COMMUNITY): Payer: BC Managed Care – PPO

## 2021-01-03 VITALS — BP 156/91 | HR 64 | Temp 97.0°F | Resp 20 | Ht 65.0 in | Wt 237.4 lb

## 2021-01-03 DIAGNOSIS — R7982 Elevated C-reactive protein (CRP): Secondary | ICD-10-CM | POA: Diagnosis not present

## 2021-01-03 DIAGNOSIS — R700 Elevated erythrocyte sedimentation rate: Secondary | ICD-10-CM | POA: Insufficient documentation

## 2021-01-03 DIAGNOSIS — Z87891 Personal history of nicotine dependence: Secondary | ICD-10-CM | POA: Insufficient documentation

## 2021-01-03 DIAGNOSIS — Z8261 Family history of arthritis: Secondary | ICD-10-CM | POA: Insufficient documentation

## 2021-01-03 DIAGNOSIS — Z79899 Other long term (current) drug therapy: Secondary | ICD-10-CM | POA: Diagnosis not present

## 2021-01-03 DIAGNOSIS — Z808 Family history of malignant neoplasm of other organs or systems: Secondary | ICD-10-CM | POA: Insufficient documentation

## 2021-01-03 DIAGNOSIS — Z806 Family history of leukemia: Secondary | ICD-10-CM | POA: Insufficient documentation

## 2021-01-03 DIAGNOSIS — E213 Hyperparathyroidism, unspecified: Secondary | ICD-10-CM | POA: Insufficient documentation

## 2021-01-03 LAB — CBC WITH DIFFERENTIAL/PLATELET
Abs Immature Granulocytes: 0.01 10*3/uL (ref 0.00–0.07)
Basophils Absolute: 0 10*3/uL (ref 0.0–0.1)
Basophils Relative: 1 %
Eosinophils Absolute: 0.1 10*3/uL (ref 0.0–0.5)
Eosinophils Relative: 2 %
HCT: 40 % (ref 36.0–46.0)
Hemoglobin: 12.7 g/dL (ref 12.0–15.0)
Immature Granulocytes: 0 %
Lymphocytes Relative: 22 %
Lymphs Abs: 1.4 10*3/uL (ref 0.7–4.0)
MCH: 29.1 pg (ref 26.0–34.0)
MCHC: 31.8 g/dL (ref 30.0–36.0)
MCV: 91.7 fL (ref 80.0–100.0)
Monocytes Absolute: 0.6 10*3/uL (ref 0.1–1.0)
Monocytes Relative: 9 %
Neutro Abs: 4.4 10*3/uL (ref 1.7–7.7)
Neutrophils Relative %: 66 %
Platelets: 197 10*3/uL (ref 150–400)
RBC: 4.36 MIL/uL (ref 3.87–5.11)
RDW: 14 % (ref 11.5–15.5)
WBC: 6.6 10*3/uL (ref 4.0–10.5)
nRBC: 0 % (ref 0.0–0.2)

## 2021-01-03 LAB — COMPREHENSIVE METABOLIC PANEL
ALT: 17 U/L (ref 0–44)
AST: 23 U/L (ref 15–41)
Albumin: 3.8 g/dL (ref 3.5–5.0)
Alkaline Phosphatase: 124 U/L (ref 38–126)
Anion gap: 6 (ref 5–15)
BUN: 9 mg/dL (ref 8–23)
CO2: 26 mmol/L (ref 22–32)
Calcium: 10.2 mg/dL (ref 8.9–10.3)
Chloride: 105 mmol/L (ref 98–111)
Creatinine, Ser: 0.77 mg/dL (ref 0.44–1.00)
GFR, Estimated: >60 mL/min (ref >60)
Glucose, Bld: 96 mg/dL (ref 70–99)
Potassium: 4.1 mmol/L (ref 3.5–5.1)
Sodium: 137 mmol/L (ref 135–145)
Total Bilirubin: 0.6 mg/dL (ref 0.3–1.2)
Total Protein: 8.2 g/dL — ABNORMAL HIGH (ref 6.5–8.1)

## 2021-01-03 LAB — C-REACTIVE PROTEIN: CRP: 0.8 mg/dL (ref <1.0)

## 2021-01-03 LAB — SEDIMENTATION RATE: Sed Rate: 35 mm/hr — ABNORMAL HIGH (ref 0–22)

## 2021-01-03 NOTE — Patient Instructions (Signed)
[place] [facility] Discharge Instructions  You were seen and examined today by Dr. Delton Coombes. Dr. Delton Coombes discussed your past medical history, family history of blood disorders/autoimmune disorders/cancers, and the events that led to you being here today. You were seen here due to elevated sedimentation rate and CRP.   Fatty liver can be associated with elevated CRP. There is no medications for this, continue with your diet and exercise. The best treatment for this is weight loss.   Thank you for choosing [place] at [facility] to provide your oncology and hematology care.  To afford each patient quality time with our provider, please arrive at least 15 minutes before your scheduled appointment time.   If you have a lab appointment with the [facility] please come in thru the Main Entrance and check in at the main information desk.  You need to re-schedule your appointment should you arrive 10 or more minutes late.  We strive to give you quality time with our providers, and arriving late affects you and other patients whose appointments are after yours.  Also, if you no show three or more times for appointments you may be dismissed from the clinic at the providers discretion.     Again, thank you for choosing [facility].  Our hope is that these requests will decrease the amount of time that you wait before being seen by our physicians.       _____________________________________________________________  Should you have questions after your visit to [facility], please contact our office at [phone] and follow the prompts.  Our office hours are 8:00 a.m. and 4:30 p.m. Monday - Friday.  Please note that voicemails left after 4:00 p.m. may not be returned until the following business day.  We are closed weekends and major holidays.  You do have access to a nurse 24-7, just call the main number to the clinic  [phone] and do not press any options, hold on the line and a nurse will answer the phone.    For prescription refill requests, have your pharmacy contact our office and allow 72 hours.    Due to Covid, you will need to wear a mask upon entering the hospital. If you do not have a mask, a mask will be given to you at the Main Entrance upon arrival. For doctor visits, patients may have 1 support person age 65 or older with them. For treatment visits, patients can not have anyone with them due to social distancing guidelines and our immunocompromised population.

## 2021-01-03 NOTE — Progress Notes (Signed)
[facility] [address] [place] [address]   CLINIC:  Medical Oncology/Hematology  Patient Care Team: Harlan Stains, MD as PCP - General (Family Medicine) Delrae Rend, MD as Consulting Physician (Endocrinology) Bo Merino, MD as Consulting Physician (Rheumatology)  CHIEF COMPLAINTS/PURPOSE OF CONSULTATION:  Evaluation of elevated ESR and CRP  HISTORY OF PRESENTING ILLNESS:  Meghan Owen 65 y.o. female is here because of evaluation of elevated CRP and ESR, at the request of Hazel Sams, PA, from [facility].  Today she reports feeling okay. She reports that her ESR and CRP have been elevated for several years, along with intermittent joint pains. She is currently not on any treatments since she tested negative for RA; she denies Crohn's or lupus or history of anemia. She reports having OA in her knees and left hip, but denies swelling in her hand joints or shoulders. She denies having recent infections, F/C, night sweats, or unexplained weight loss, though she reports having occasional hot flashes. She has lost 30 lbs intentionally by exercising and changing her diet. She has not had a mammogram in several years; she used to have mammograms in the breast center in [place].  Her mother had RA, lupus, polymyositis and dermomyositis. Her sister had multiple myeloma; maternal uncle had multiple myeloma; her father had chronic leukemia after receiving treatment. She denies having family history of DVT's or PE's. She lives with her 2 grandsons. She used to work as a Geologist, engineering for elderly patients and her last job was with [employer]; she denies having chemical exposure. She used to smoke 3 cigarettes per week and quit in 1998.   MEDICAL HISTORY:  Past Medical History:  Diagnosis Date  . Arthritis   . Bursitis, hip    bilateral  . DDD (degenerative disc disease), lumbar   . Fibromyalgia   . GERD (gastroesophageal reflux disease)    HX  .  Hashimoto's thyroiditis   . Headache   . Hyperparathyroidism ([place])    Dr. Elyse Hsu  . Hypothyroidism   . Liver cyst    BENIGN  . Osteoarthritis   . Parathyroid disorder ([place])    HYPERPARATHYROID  . Spinal stenosis   . Thyroid disease     SURGICAL HISTORY: Past Surgical History:  Procedure Laterality Date  . ABDOMINAL HYSTERECTOMY    . ANKLE SURGERY    . ANTERIOR CERVICAL DECOMPRESSION/DISCECTOMY FUSION 4 LEVELS N/A 09/05/2016   Procedure: Cervical three-four Cervical four-five  Cervical five-six Cervical six-seven  Anterior cervical decompression/diskectomy/fusion;  Surgeon: Erline Levine, MD;  Location: [facility];  Service: Neurosurgery;  Laterality: N/A;  . CARPAL TUNNEL RELEASE Left 02/26/2018  . ELBOW SURGERY  04/2017   cyst removed   . FOOT SURGERY      SOCIAL HISTORY: Social History   Socioeconomic History  . Marital status: Married    Spouse name: Not on file  . Number of children: Not on file  . Years of education: Not on file  . Highest education level: Not on file  Occupational History  . Not on file  Tobacco Use  . Smoking status: Former Smoker    Types: Cigarettes  . Smokeless tobacco: Never Used  Vaping Use  . Vaping Use: Never used  Substance and Sexual Activity  . Alcohol use: No  . Drug use: No  . Sexual activity: Not on file  Other Topics Concern  . Not on file  Social History Narrative   Caffeine yes, widowed.  3 children.  Social Determinants of Health   Financial Resource Strain: Low Risk   . Difficulty of Paying Living Expenses: Not very hard  Food Insecurity: No Food Insecurity  . Worried About Charity fundraiser in the Last Year: Never true  . Ran Out of Food in the Last Year: Never true  Transportation Needs: No Transportation Needs  . Lack of Transportation (Medical): No  . Lack of Transportation (Non-Medical): No  Physical Activity: Inactive  . Days of Exercise per Week: 0 days  . Minutes of Exercise per Session: 0 min   Stress: No Stress Concern Present  . Feeling of Stress : Only a little  Social Connections: Moderately Integrated  . Frequency of Communication with Friends and Family: Three times a week  . Frequency of Social Gatherings with Friends and Family: Twice a week  . Attends Religious Services: 1 to 4 times per year  . Active Member of Clubs or Organizations: No  . Attends Archivist Meetings: Never  . Marital Status: Married  Human resources officer Violence: Not At Risk  . Fear of Current or Ex-Partner: No  . Emotionally Abused: No  . Physically Abused: No  . Sexually Abused: No    FAMILY HISTORY: Family History  Problem Relation Age of Onset  . Lupus Mother   . Rheum arthritis Mother   . Dementia Father   . Leukemia Father   . Cancer Other   . Cancer Sister        bone   . Healthy Son   . Healthy Son   . Healthy Daughter     ALLERGIES:  is allergic to aspirin, other, contrast media [iodinated diagnostic agents], iodine-131, ioxaglate, penicillins, and prednisone.  MEDICATIONS:  Current Outpatient Medications  Medication Sig Dispense Refill  . Ascorbic Acid (VITAMIN C PO) Take 1 tablet by mouth daily.    Marland Kitchen b complex vitamins tablet Take 1 tablet by mouth daily.    . Cholecalciferol (VITAMIN D3) 5000 UNITS CAPS Take 5,000 Units by mouth daily.     Marland Kitchen MAGNESIUM PO Take 500 mg by mouth daily.     . Multiple Vitamin (MULTIVITAMIN WITH MINERALS) TABS Take 1 tablet by mouth daily. [facility] OTC    . Probiotic Product (PROBIOTIC PO) Take 1 tablet by mouth daily.    . TURMERIC PO Take 1,500 mg by mouth 3 (three) times daily.     Marland Kitchen UNABLE TO FIND Med Name: Curamin 1 tablet as needed 368m    . vitamin E 1000 UNIT capsule Take by mouth.     No current facility-administered medications for this visit.    REVIEW OF SYSTEMS:   Review of Systems  Constitutional: Positive for fatigue (75%). Negative for appetite change, chills, diaphoresis, fever and unexpected weight change.   Gastrointestinal: Positive for abdominal pain (8/10 RLQ pain).  Endocrine: Positive for hot flashes (occasional).  Musculoskeletal: Positive for arthralgias (bilat knees & L hip) and back pain (lower back pain).  All other systems reviewed and are negative.    PHYSICAL EXAMINATION: ECOG PERFORMANCE STATUS: 1 - Symptomatic but completely ambulatory  Vitals:   01/03/21 1105  BP: (!) 156/91  Pulse: 64  Resp: 20  Temp: (!) 97 F (36.1 C)  SpO2: 100%   Filed Weights   01/03/21 1105  Weight: 237 lb 6.4 oz (107.7 kg)   Physical Exam Vitals reviewed.  Constitutional:      Appearance: Normal appearance. She is obese.  Neck:     Thyroid: No thyroid  mass.  Cardiovascular:     Rate and Rhythm: Normal rate and regular rhythm.     Pulses: Normal pulses.     Heart sounds: Normal heart sounds.  Pulmonary:     Effort: Pulmonary effort is normal.     Breath sounds: Normal breath sounds.  Chest:  Breasts:     Right: No supraclavicular adenopathy.     Left: No supraclavicular adenopathy.    Abdominal:     Palpations: Abdomen is soft. There is no hepatomegaly, splenomegaly or mass.     Tenderness: There is no abdominal tenderness.     Hernia: No hernia is present.  Lymphadenopathy:     Cervical: No cervical adenopathy.     Upper Body:     Right upper body: No supraclavicular adenopathy.     Left upper body: No supraclavicular adenopathy.     Lower Body: No right inguinal adenopathy. No left inguinal adenopathy.  Neurological:     General: No focal deficit present.     Mental Status: She is alert and oriented to person, place, and time.  Psychiatric:        Mood and Affect: Mood normal.        Behavior: Behavior normal.      LABORATORY DATA:  I have reviewed the data as listed Recent Results (from the past 2160 hour(s))  Sedimentation rate     Status: Abnormal   Collection Time: 11/23/20 10:41 AM  Result Value Ref Range   Sed Rate 53 (H) 0 - 30 mm/h  C-reactive  protein     Status: Abnormal   Collection Time: 11/23/20 10:41 AM  Result Value Ref Range   CRP 12.2 (H) <8.0 mg/L    RADIOGRAPHIC STUDIES: I have personally reviewed the radiological images as listed and agreed with the findings in the report. MR LUMBAR SPINE WO CONTRAST  Result Date: 12/20/2020 CLINICAL DATA:  Low back pain radiating into both legs for 2 months. No known injury. EXAM: MRI LUMBAR SPINE WITHOUT CONTRAST TECHNIQUE: Multiplanar, multisequence MR imaging of the lumbar spine was performed. No intravenous contrast was administered. COMPARISON:  Plain films lumbar spine 11/23/2020. FINDINGS: Segmentation:  Standard. Alignment:  Maintained. Vertebrae:  No fracture, evidence of discitis, or bone lesion. Conus medullaris and cauda equina: Conus extends to the L1 level. Conus and cauda equina appear normal. Paraspinal and other soft tissues: Negative. Disc levels: T11-12: Mild facet degenerative change.  Otherwise negative. T12-L1: Negative. L1-2: Negative. L2-3: Mild-to-moderate facet degenerative change and a shallow disc bulge. No stenosis. L3-4: Mild-to-moderate facet degenerative change and a shallow disc bulge. No stenosis. L4-5: Mild-to-moderate facet degenerative change and a shallow disc bulge without stenosis. L5-S1: Very shallow disc bulge.  Otherwise negative. IMPRESSION: Mild lumbar degenerative disease as described above without central canal or foraminal narrowing. Electronically Signed   By: Inge Rise M.D.   On: 12/20/2020 12:45    ASSESSMENT:  1.  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 -She does not report any joint pains other than bilateral knee pains and left hip pain from arthritis. -Denies any fevers. Positive for hot flashes. 30 pound weight loss in the last 6 months, but was trying to lose weight by changing diet and exercising. -Labs on 11/23/2020 shows elevated CRP of 12.2 and ESR of 53. She denied any infections  immediately prior to those labs. Denies any joint pains. -MRI abdomen on 06/10/2016 with mild diffuse hepatic steatosis. Right adrenal adenoma 3.2 cm. Small liver  hemangiomas and small simple liver cyst.   2.  Social/family history: -She lives at home with her 2 grandsons and is retired.  No exposure to chemicals.  Quit smoking 1998.  Although smoker. -Mother had rheumatoid arthritis/polymyositis/lupus.  Sister died of multiple myeloma at age 27.  Father had some form of leukemia.  Maternal uncle had multiple myeloma.   PLAN:  1.  Elevated ESR and CRP: -Discussed various causes of elevated ESR and CRP. -Hematological causes like anemia, renal disease can cause elevated ESR. Fatty liver is also reported to cause elevated ESR and CRP. -We will repeat ESR and CRP today along with CBC and CMP. As per total protein was slightly elevated and given family history, will also check serum protein electrophoresis. -We will see her back in 2 to 3 weeks to discuss results.  2. Health maintenance: -She did not have any recent mammograms. Recommended doing mammogram, bilateral screening.  3. Mild hypercalcemia: -Calcium in September 2020 was mildly elevated at 10.5. -She does report hyperparathyroidism. We will repeat calcium level today.   All questions were answered. The patient knows to call the clinic with any problems, questions or concerns.   Derek Jack, MD 01/03/21 12:09 PM  [facility] [phone]   I, Milinda Antis, am acting as a scribe for Dr. Sanda Linger.  I, Derek Jack MD, have reviewed the above documentation for accuracy and completeness, and I agree with the above.

## 2021-01-04 LAB — PROTEIN ELECTROPHORESIS, SERUM
A/G Ratio: 0.9 (ref 0.7–1.7)
Albumin ELP: 3.6 g/dL (ref 2.9–4.4)
Alpha-1-Globulin: 0.2 g/dL (ref 0.0–0.4)
Alpha-2-Globulin: 0.7 g/dL (ref 0.4–1.0)
Beta Globulin: 1.2 g/dL (ref 0.7–1.3)
Gamma Globulin: 2 g/dL — ABNORMAL HIGH (ref 0.4–1.8)
Globulin, Total: 4.1 g/dL — ABNORMAL HIGH (ref 2.2–3.9)
Total Protein ELP: 7.7 g/dL (ref 6.0–8.5)

## 2021-01-13 ENCOUNTER — Encounter: Payer: BC Managed Care – PPO | Admitting: Physical Medicine and Rehabilitation

## 2021-01-13 ENCOUNTER — Telehealth: Payer: Self-pay | Admitting: Physical Medicine and Rehabilitation

## 2021-01-13 NOTE — Telephone Encounter (Signed)
Appointment cancelled per patient request

## 2021-01-13 NOTE — Telephone Encounter (Signed)
Pt called stating she isn't feeling well so she wants to cancel her appt for today @ 10:30 and she will CB later to R/S

## 2021-01-19 ENCOUNTER — Other Ambulatory Visit: Payer: Self-pay

## 2021-01-19 ENCOUNTER — Ambulatory Visit (HOSPITAL_COMMUNITY)
Admission: RE | Admit: 2021-01-19 | Discharge: 2021-01-19 | Disposition: A | Discharge status: Home / Self Care | Payer: BC Managed Care – PPO | Source: Ambulatory Visit | Attending: Hematology | Admitting: Hematology

## 2021-01-19 DIAGNOSIS — R7982 Elevated C-reactive protein (CRP): Secondary | ICD-10-CM

## 2021-01-19 DIAGNOSIS — R700 Elevated erythrocyte sedimentation rate: Secondary | ICD-10-CM | POA: Diagnosis not present

## 2021-01-19 DIAGNOSIS — Z1231 Encounter for screening mammogram for malignant neoplasm of breast: Secondary | ICD-10-CM | POA: Diagnosis not present

## 2021-01-25 ENCOUNTER — Inpatient Hospital Stay (HOSPITAL_COMMUNITY): Payer: BC Managed Care – PPO | Attending: Hematology | Admitting: Hematology

## 2021-01-25 ENCOUNTER — Other Ambulatory Visit: Payer: Self-pay

## 2021-01-25 VITALS — BP 149/73 | HR 56 | Temp 96.8°F | Resp 20 | Wt 233.8 lb

## 2021-01-25 DIAGNOSIS — Z806 Family history of leukemia: Secondary | ICD-10-CM | POA: Diagnosis not present

## 2021-01-25 DIAGNOSIS — Z87891 Personal history of nicotine dependence: Secondary | ICD-10-CM | POA: Diagnosis not present

## 2021-01-25 DIAGNOSIS — R700 Elevated erythrocyte sedimentation rate: Secondary | ICD-10-CM | POA: Diagnosis not present

## 2021-01-25 DIAGNOSIS — Z807 Family history of other malignant neoplasms of lymphoid, hematopoietic and related tissues: Secondary | ICD-10-CM | POA: Diagnosis not present

## 2021-01-25 DIAGNOSIS — Z8261 Family history of arthritis: Secondary | ICD-10-CM | POA: Diagnosis not present

## 2021-01-25 DIAGNOSIS — M1230 Palindromic rheumatism, unspecified site: Secondary | ICD-10-CM | POA: Diagnosis not present

## 2021-01-25 DIAGNOSIS — R7982 Elevated C-reactive protein (CRP): Secondary | ICD-10-CM | POA: Diagnosis not present

## 2021-01-25 DIAGNOSIS — Z832 Family history of diseases of the blood and blood-forming organs and certain disorders involving the immune mechanism: Secondary | ICD-10-CM | POA: Insufficient documentation

## 2021-01-25 NOTE — Patient Instructions (Signed)
[facility] Discharge Instructions  You were seen today by Dr. Katragadda. He went over your recent results and scans. Dr. Katragadda will see you back in 4 months for labs and follow up.   Thank you for choosing [facility] at [facility] to provide your oncology and hematology care.  To afford each patient quality time with our provider, please arrive at least 15 minutes before your scheduled appointment time.   If you have a lab appointment with the Cancer Center please come in thru the Main Entrance and check in at the main information desk  You need to re-schedule your appointment should you arrive 10 or more minutes late.  We strive to give you quality time with our providers, and arriving late affects you and other patients whose appointments are after yours.  Also, if you no show three or more times for appointments you may be dismissed from the clinic at the providers discretion.     Again, thank you for choosing  Cancer Center.  Our hope is that these requests will decrease the amount of time that you wait before being seen by our physicians.       _____________________________________________________________  Should you have questions after your visit to  Cancer Center, please contact our office at [phone] between the hours of 8:00 a.m. and 4:30 p.m.  Voicemails left after 4:00 p.m. will not be returned until the following business day.  For prescription refill requests, have your pharmacy contact our office and allow 72 hours.    Cancer Center Support Programs:   > Cancer Support Group  2nd Tuesday of the month 1pm-2pm, Journey Room   

## 2021-01-25 NOTE — Progress Notes (Signed)
[facility] [place], [place] [address]   CLINIC:  Medical Oncology/Hematology  PCP:  Harlan Stains, MD [address] / [place] [address]  [phone]  REASON FOR VISIT:  Follow-up for elevated ESR and CRP  PRIOR THERAPY: None  CURRENT THERAPY: Observation  INTERVAL HISTORY:  [name], a 65 y.o. female, returns for routine follow-up for her elevated ESR and CRP. Meghan Owen was last seen on 01/03/2021.  Today she reports feeling well. She continues having pain in her joints due to OA, most prominent in her left shoulder, knees and lower back, but denies swelling in her joints or skin rashes. She has not started any new meds.   REVIEW OF SYSTEMS:  Review of Systems  Constitutional: Positive for appetite change and fatigue.  Musculoskeletal: Positive for arthralgias (OA in L shoulder, knees and lower back).  Skin: Negative for rash.  All other systems reviewed and are negative.   PAST MEDICAL/SURGICAL HISTORY:  Past Medical History:  Diagnosis Date  . Arthritis   . Bursitis, hip    bilateral  . DDD (degenerative disc disease), lumbar   . Fibromyalgia   . GERD (gastroesophageal reflux disease)    HX  . Hashimoto's thyroiditis   . Headache   . Hyperparathyroidism ([place])    Dr. Elyse Hsu  . Hypothyroidism   . Liver cyst    BENIGN  . Osteoarthritis   . Parathyroid disorder ([place])    HYPERPARATHYROID  . Spinal stenosis   . Thyroid disease    Past Surgical History:  Procedure Laterality Date  . ABDOMINAL HYSTERECTOMY    . ANKLE SURGERY    . ANTERIOR CERVICAL DECOMPRESSION/DISCECTOMY FUSION 4 LEVELS N/A 09/05/2016   Procedure: Cervical three-four Cervical four-five  Cervical five-six Cervical six-seven  Anterior cervical decompression/diskectomy/fusion;  Surgeon: Erline Levine, MD;  Location: [facility];  Service: Neurosurgery;  Laterality: N/A;  . CARPAL TUNNEL RELEASE Left 02/26/2018  . ELBOW SURGERY  04/2017    cyst removed   . FOOT SURGERY      SOCIAL HISTORY:  Social History   Socioeconomic History  . Marital status: Married    Spouse name: Not on file  . Number of children: Not on file  . Years of education: Not on file  . Highest education level: Not on file  Occupational History  . Not on file  Tobacco Use  . Smoking status: Former Smoker    Types: Cigarettes  . Smokeless tobacco: Never Used  Vaping Use  . Vaping Use: Never used  Substance and Sexual Activity  . Alcohol use: No  . Drug use: No  . Sexual activity: Not on file  Other Topics Concern  . Not on file  Social History Narrative   Caffeine yes, widowed.  3 children.    Social Determinants of Health   Financial Resource Strain: Low Risk   . Difficulty of Paying Living Expenses: Not very hard  Food Insecurity: No Food Insecurity  . Worried About Charity fundraiser in the Last Year: Never true  . Ran Out of Food in the Last Year: Never true  Transportation Owen: No Transportation Owen  . Lack of Transportation (Medical): No  . Lack of Transportation (Non-Medical): No  Physical Activity: Inactive  . Days of Exercise per Week: 0 days  . Minutes of Exercise per Session: 0 min  Stress: No Stress Concern Present  . Feeling of Stress : Only a little  Social  Connections: Moderately Integrated  . Frequency of Communication with Friends and Family: Three times a week  . Frequency of Social Gatherings with Friends and Family: Twice a week  . Attends Religious Services: 1 to 4 times per year  . Active Member of Clubs or Organizations: No  . Attends Archivist Meetings: Never  . Marital Status: Married  Human resources officer Violence: Not At Risk  . Fear of Current or Ex-Partner: No  . Emotionally Abused: No  . Physically Abused: No  . Sexually Abused: No    FAMILY HISTORY:  Family History  Problem Relation Age of Onset  . Lupus Mother   . Rheum arthritis Mother   . Dementia Father   . Leukemia Father    . Cancer Other   . Cancer Sister        bone   . Healthy Son   . Healthy Son   . Healthy Daughter     CURRENT MEDICATIONS:  Current Outpatient Medications  Medication Sig Dispense Refill  . Ascorbic Acid (VITAMIN C PO) Take 1 tablet by mouth daily.    Marland Kitchen b complex vitamins tablet Take 1 tablet by mouth daily.    . Cholecalciferol (VITAMIN D3) 5000 UNITS CAPS Take 5,000 Units by mouth daily.     Marland Kitchen MAGNESIUM PO Take 500 mg by mouth daily.     . Multiple Vitamin (MULTIVITAMIN WITH MINERALS) TABS Take 1 tablet by mouth daily. [employer] OTC    . Probiotic Product (PROBIOTIC PO) Take 1 tablet by mouth daily.    . TURMERIC PO Take 1,500 mg by mouth 3 (three) times daily.     Marland Kitchen UNABLE TO FIND Med Name: Curamin 1 tablet as needed 33m    . vitamin E 1000 UNIT capsule Take by mouth.     No current facility-administered medications for this visit.    ALLERGIES:  Allergies  Allergen Reactions  . Aspirin Palpitations and Other (See Comments)    Aspirin with caffeine only. Causes tachycardia  . Other   . Contrast Media [Iodinated Diagnostic Agents] Other (See Comments)    Hot flashes   . Iodine-131 Other (See Comments)    flushing  . Ioxaglate Other (See Comments)    Hot flashes   . Penicillins Other (See Comments)    Makes have more infections  Has patient had a PCN reaction causing immediate rash, facial/tongue/throat swelling, SOB or lightheadedness with hypotension: No Has patient had a PCN reaction causing severe rash involving mucus membranes or skin necrosis: No Has patient had a PCN reaction that required hospitalization: No Has patient had a PCN reaction occurring within the last 10 years:No If all of the above answers are "NO", then may proceed with Cephalosporin use.   . Prednisone Other (See Comments)    Repeats infections    PHYSICAL EXAM:  Performance status (ECOG): 1 - Symptomatic but completely ambulatory  Vitals:   01/25/21 1159  BP: (!) 149/73  Pulse: (!) 56   Resp: 20  Temp: (!) 96.8 F (36 C)  SpO2: 100%   Wt Readings from Last 3 Encounters:  01/25/21 233 lb 12.8 oz (106.1 kg)  01/03/21 237 lb 6.4 oz (107.7 kg)  11/23/20 234 lb (106.1 kg)   Physical Exam Vitals reviewed.  Constitutional:      Appearance: Normal appearance. She is obese.  Cardiovascular:     Rate and Rhythm: Normal rate and regular rhythm.     Pulses: Normal pulses.     Heart sounds:  Normal heart sounds.  Pulmonary:     Effort: Pulmonary effort is normal.     Breath sounds: Normal breath sounds.  Musculoskeletal:     Right elbow: No swelling. No tenderness.     Left elbow: No swelling. No tenderness.     Right wrist: No swelling.     Left wrist: No swelling.     Right hand: No swelling.     Left hand: No swelling.     Right lower leg: No edema.     Left lower leg: No edema.  Neurological:     General: No focal deficit present.     Mental Status: She is alert and oriented to person, place, and time.  Psychiatric:        Mood and Affect: Mood normal.        Behavior: Behavior normal.     LABORATORY DATA:  I have reviewed the labs as listed.  CBC Latest Ref Rng & Units 01/03/2021 08/20/2019 09/09/2018  WBC 4.0 - 10.5 K/uL 6.6 7.2 7.9  Hemoglobin 12.0 - 15.0 g/dL 12.7 12.8 13.2  Hematocrit 36.0 - 46.0 % 40.0 39.0 39.3  Platelets 150 - 400 K/uL 197 231 250   CMP Latest Ref Rng & Units 01/03/2021 08/20/2019 09/09/2018  Glucose 70 - 99 mg/dL 96 97 85  BUN 8 - 23 mg/dL _0 Creatinine 0.44 - 1.00 mg/dL 0.77 0.73 0.86  Sodium 135 - 145 mmol/L 137 137 136  Potassium 3.5 - 5.1 mmol/L 4.1 4.5 4.9  Chloride 98 - 111 mmol/L 105 105 104  CO2 22 - 32 mmol/L _1 Calcium 8.9 - 10.3 mg/dL 10.2 10.5(H) 10.9(H)  Total Protein 6.5 - 8.1 g/dL 8.2(H) 8.0 8.3(H)  Total Bilirubin 0.3 - 1.2 mg/dL 0.6 0.4 0.7  Alkaline Phos 38 - 126 U/L 124 - -  AST 15 - 41 U/L _2 ALT 0 - 44 U/L _3 Component Value Date/Time   RBC 4.36 01/03/2021 1211   MCV 91.7  01/03/2021 1211   MCH 29.1 01/03/2021 1211   MCHC 31.8 01/03/2021 1211   RDW 14.0 01/03/2021 1211   LYMPHSABS 1.4 01/03/2021 1211   MONOABS 0.6 01/03/2021 1211   EOSABS 0.1 01/03/2021 1211   BASOSABS 0.0 01/03/2021 1211    DIAGNOSTIC IMAGING:  I have independently reviewed the scans and discussed with the patient. MM 3D SCREEN BREAST BILATERAL  Result Date: 01/21/2021 CLINICAL DATA:  Screening. EXAM: DIGITAL SCREENING BILATERAL MAMMOGRAM WITH TOMOSYNTHESIS AND CAD TECHNIQUE: Bilateral screening digital craniocaudal and mediolateral oblique mammograms were obtained. Bilateral screening digital breast tomosynthesis was performed. The images were evaluated with computer-aided detection. COMPARISON:  None. ACR Breast Density Category b: There are scattered areas of fibroglandular density. FINDINGS: There are no findings suspicious for malignancy. IMPRESSION: No mammographic evidence of malignancy. A result letter of this screening mammogram will be mailed directly to the patient. RECOMMENDATION: Screening mammogram in one year. (Code:SM-B-01Y) BI-RADS CATEGORY  1: Negative. Electronically Signed   By: Dorise Bullion III M.D   On: 01/21/2021 15:57     ASSESSMENT:  1.  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 -She does not report any joint pains other than bilateral knee pains and left hip pain from arthritis. -Denies any fevers. Positive for hot flashes. 30 pound weight loss in the last 6 months, but was trying to lose weight by changing diet and exercising. -Labs  on 11/23/2020 shows elevated CRP of 12.2 and ESR of 53. She denied any infections immediately prior to those labs. Denies any joint pains. -MRI abdomen on 06/10/2016 with mild diffuse hepatic steatosis. Right adrenal adenoma 3.2 cm. Small liver hemangiomas and small simple liver cyst.   2.  Social/family history: -She lives at home with her 2 grandsons and is retired.  No exposure to  chemicals.  Quit smoking 1998.  Although smoker. -Mother had rheumatoid arthritis/polymyositis/lupus.  Sister died of multiple myeloma at age 33.  Father had some form of leukemia.  Maternal uncle had multiple myeloma.   PLAN:  1.  Elevated ESR and CRP: -We have done lab work on 01/03/2021.  CRP improved to 0.8 from 12.2. -ESR improved to 35 from 53. -She has some arthritis in the knees and lower back.  No other joint swellings or arthritic pains. -CBC was completely normal.  SPEP was negative, which was done for increased total protein of 8.2. -No further work-up is needed at this time as her labs almost normalized. -We will recheck in 4 months with repeat labs.  If ESR normalizes, will discharge from clinic.  2. Health maintenance: -Bilateral mammograms on 01/19/2021 was BI-RADS Category 1 reviewed by me.  3. Mild hypercalcemia: -She has history of mildly elevated calcium.  Repeat calcium today is normal at 10.2.  Orders placed this encounter:  Orders Placed This Encounter  Procedures  . CBC with Differential/Platelet  . Comprehensive metabolic panel  . Sedimentation rate  . C-reactive protein     Derek Jack, MD [place] [phone]   I, Milinda Antis, am acting as a scribe for Dr. Sanda Linger.  I, Derek Jack MD, have reviewed the above documentation for accuracy and completeness, and I agree with the above.

## 2021-01-28 DIAGNOSIS — E063 Autoimmune thyroiditis: Secondary | ICD-10-CM | POA: Diagnosis not present

## 2021-01-28 DIAGNOSIS — R7303 Prediabetes: Secondary | ICD-10-CM | POA: Diagnosis not present

## 2021-01-28 DIAGNOSIS — E210 Primary hyperparathyroidism: Secondary | ICD-10-CM | POA: Diagnosis not present

## 2021-01-31 ENCOUNTER — Encounter: Payer: Self-pay | Admitting: Neurology

## 2021-01-31 ENCOUNTER — Ambulatory Visit (INDEPENDENT_AMBULATORY_CARE_PROVIDER_SITE_OTHER): Payer: BC Managed Care – PPO | Admitting: Neurology

## 2021-01-31 VITALS — BP 147/81 | HR 58 | Ht 65.0 in | Wt 232.0 lb

## 2021-01-31 DIAGNOSIS — R202 Paresthesia of skin: Secondary | ICD-10-CM | POA: Diagnosis not present

## 2021-01-31 NOTE — Patient Instructions (Signed)
Will order NCV/EMG Recheck copper level

## 2021-01-31 NOTE — Progress Notes (Signed)
PATIENT: Meghan Owen DOB: 1956-04-10  REASON FOR VISIT: follow up HISTORY FROM: patient  HISTORY OF PRESENT ILLNESS: Today 01/31/21 [name] is a 65 year old female with history of cervical spondylosis with surgery, episodic tremor, gait disturbance.  Some question of fibromyalgia in the past.  Has chronically elevated sed rate, CRP.  Has positive ANA, follows with Dr. Estanislado Pandy for undetermined connective tissue disorder, possibly RA.  She has Hashimoto's thyroiditis.  Her cervical spine surgery was in 2007 with Dr. Vertell Limber, over the years, intermittent episodes of prickly sensations through the body.  This sensation worsened after the passing of her husband in Dec 16, 2019.  Laboratory evaluation showed a slightly elevated copper level.  MRI of the brain and cervical spine were unremarkable.  Felt to be from stress or fibromyalgia.  Has seen oncology, CRP, ESR improved.  Today, she has done some research and thinks she has small fiber neuropathy. She feels burning all over her body, started in her feet. Has tingling in both uppers, worse on the left. Feels something is crawling under her skin, claims it started early 2021 when she was referred here. The burning and prickly are separate and both come and go. Feels someone sticking her with a pin all over, then electrical shocks all over. Feels this is getting worse. Claims these come in flares, when bad has to use a walker. The paresthesia symptoms is her main concerns. At times she feels she may fall, has to hold on. In 2018 had NCV left upper, had CTS surgery still problem. Claims does not have fibromyalgia.  Here today for evaluation unaccompanied.  HISTORY 05/07/2020 Dr. Jannifer Franklin: [name] is a 65 year old right-handed black female with a history of cervical spondylosis in the past requiring surgery.  The patient has been seen through neurology off and on since 1998 for episodic tremors and gait disturbance.  The patient was seen in 2014  through neurology and was felt to have a psychogenic tremor.  The patient over the years has had neuromuscular discomfort and there is some question of fibromyalgia in the past.  The patient however has had chronically elevated inflammatory markers with sedimentation rate and C-reactive protein.  She has had a positive ANA in the past, she is currently followed by Dr. Estanislado Pandy for an undetermined connective tissue disorder, possibly rheumatoid arthritis.  The patient has also been found to have Hashimoto's thyroiditis and primary hyperparathyroidism, she is followed through Dr. Buddy Duty through endocrinology.  The patient has undergone cervical spine surgery by Dr. Vertell Limber in 2017.  She has had over many years intermittent episodes of prickly sensations migrating about the body, these episodes have worsened since 12/16/19.  She goes on to say that her husband died in 12-16-2019.  The patient may go 2 weeks without any symptoms and then the symptoms may come and go for a week and then disappear again.  The symptoms oftentimes are worse at night when she is trying to get to sleep.  She reports no associated shortness of breath or increased heart rate with the events.  She does note however that she oftentimes will get some dizziness associated with a floating sensation that correlates with the onset of the prickly sensations and crawling sensations of the skin.  The episodes may occur on the arms or legs or body or face.  She sometimes will have sharp pains in the V3 distribution on the left.  She does have some chronic low back pain and some neck  discomfort but no pain going down the arms.  She denies issues controlling the bowels or the bladder.  She believes that there has been some mild change in balance but she reports no falls.  When she does get a headache, the headache may last 2 to 3 hours, she usually does not take any medications for the headache which may occur 4-6 times a month.  Headaches tend to come  up in the back of the head.  There is no associated nausea or vomiting or photophobia or phonophobia.  She still has some tremor that comes and goes but is not always present.  Given the above symptoms, the patient is sent to this office for further evaluation.  In the past, MRI evaluation of the brain have been completely normal.  REVIEW OF SYSTEMS: Out of a complete 14 system review of symptoms, the patient complains only of the following symptoms, and all other reviewed systems are negative.  See HPI  ALLERGIES: Allergies  Allergen Reactions  . Aspirin Palpitations and Other (See Comments)    Aspirin with caffeine only. Causes tachycardia  . Other   . Contrast Media [Iodinated Diagnostic Agents] Other (See Comments)    Hot flashes   . Iodine-131 Other (See Comments)    flushing  . Ioxaglate Other (See Comments)    Hot flashes   . Penicillins Other (See Comments)    Makes have more infections  Has patient had a PCN reaction causing immediate rash, facial/tongue/throat swelling, SOB or lightheadedness with hypotension: No Has patient had a PCN reaction causing severe rash involving mucus membranes or skin necrosis: No Has patient had a PCN reaction that required hospitalization: No Has patient had a PCN reaction occurring within the last 10 years:No If all of the above answers are "NO", then may proceed with Cephalosporin use.   . Prednisone Other (See Comments)    Repeats infections    HOME MEDICATIONS: Outpatient Medications Prior to Visit  Medication Sig Dispense Refill  . Ascorbic Acid (VITAMIN C PO) Take 1 tablet by mouth daily.    Marland Kitchen b complex vitamins tablet Take 1 tablet by mouth daily.    . Cholecalciferol (VITAMIN D3) 5000 UNITS CAPS Take 5,000 Units by mouth daily.     Marland Kitchen MAGNESIUM PO Take 500 mg by mouth daily.     . Multiple Vitamin (MULTIVITAMIN WITH MINERALS) TABS Take 1 tablet by mouth daily. [employer] OTC    . Probiotic Product (PROBIOTIC PO) Take 1 tablet by mouth  daily.    . TURMERIC PO Take 1,500 mg by mouth 3 (three) times daily.     Marland Kitchen UNABLE TO FIND Med Name: Curamin 1 tablet as needed $RemoveB'350mg'xLEXfRQF$     . vitamin E 1000 UNIT capsule Take by mouth.     No facility-administered medications prior to visit.    PAST MEDICAL HISTORY: Past Medical History:  Diagnosis Date  . Arthritis   . Bursitis, hip    bilateral  . DDD (degenerative disc disease), lumbar   . Fibromyalgia   . GERD (gastroesophageal reflux disease)    HX  . Hashimoto's thyroiditis   . Headache   . Hyperparathyroidism ([place])    Dr. Elyse Hsu  . Hypothyroidism   . Liver cyst    BENIGN  . Osteoarthritis   . Parathyroid disorder ([place])    HYPERPARATHYROID  . Spinal stenosis   . Thyroid disease     PAST SURGICAL HISTORY: Past Surgical History:  Procedure Laterality Date  . ABDOMINAL HYSTERECTOMY    .  ANKLE SURGERY    . ANTERIOR CERVICAL DECOMPRESSION/DISCECTOMY FUSION 4 LEVELS N/A 09/05/2016   Procedure: Cervical three-four Cervical four-five  Cervical five-six Cervical six-seven  Anterior cervical decompression/diskectomy/fusion;  Surgeon: Erline Levine, MD;  Location: [facility];  Service: Neurosurgery;  Laterality: N/A;  . CARPAL TUNNEL RELEASE Left 02/26/2018  . ELBOW SURGERY  04/2017   cyst removed   . FOOT SURGERY      FAMILY HISTORY: Family History  Problem Relation Age of Onset  . Lupus Mother   . Rheum arthritis Mother   . Dementia Father   . Leukemia Father   . Cancer Other   . Cancer Sister        bone   . Healthy Son   . Healthy Son   . Healthy Daughter     SOCIAL HISTORY: Social History   Socioeconomic History  . Marital status: Married    Spouse name: Not on file  . Number of children: Not on file  . Years of education: Not on file  . Highest education level: Not on file  Occupational History  . Not on file  Tobacco Use  . Smoking status: Former Smoker    Types: Cigarettes  . Smokeless tobacco: Never Used  Vaping Use  . Vaping Use: Never  used  Substance and Sexual Activity  . Alcohol use: No  . Drug use: No  . Sexual activity: Not on file  Other Topics Concern  . Not on file  Social History Narrative   Caffeine yes, widowed.  3 children.    Social Determinants of Health   Financial Resource Strain: Low Risk   . Difficulty of Paying Living Expenses: Not very hard  Food Insecurity: No Food Insecurity  . Worried About Charity fundraiser in the Last Year: Never true  . Ran Out of Food in the Last Year: Never true  Transportation Needs: No Transportation Needs  . Lack of Transportation (Medical): No  . Lack of Transportation (Non-Medical): No  Physical Activity: Inactive  . Days of Exercise per Week: 0 days  . Minutes of Exercise per Session: 0 min  Stress: No Stress Concern Present  . Feeling of Stress : Only a little  Social Connections: Moderately Integrated  . Frequency of Communication with Friends and Family: Three times a week  . Frequency of Social Gatherings with Friends and Family: Twice a week  . Attends Religious Services: 1 to 4 times per year  . Active Member of Clubs or Organizations: No  . Attends Archivist Meetings: Never  . Marital Status: Married  Human resources officer Violence: Not At Risk  . Fear of Current or Ex-Partner: No  . Emotionally Abused: No  . Physically Abused: No  . Sexually Abused: No   PHYSICAL EXAM  Vitals:   01/31/21 1027  BP: (!) 147/81  Pulse: (!) 58  Weight: 232 lb (105.2 kg)  Height: $Remove'5\' 5"'gEIGzqT$  (1.651 m)   Body mass index is 38.61 kg/m.  Generalized: Well developed, in no acute distress   Neurological examination  Mentation: Alert oriented to time, place, history taking. Follows all commands speech and language fluent Cranial nerve II-XII: Pupils were equal round reactive to light. Extraocular movements were full, visual field were full on confrontational test. Facial sensation and strength were normal. Head turning and shoulder shrug  were normal and  symmetric. Motor: The motor testing reveals 5 over 5 strength of all 4 extremities. Good symmetric motor tone is noted throughout.  Sensory:  Sensation is intact to all extremities with soft touch, vibration, pinprick, with exception to the left lower extremity decreased pinprick sensation Coordination: Cerebellar testing reveals good finger-nose-finger and heel-to-shin bilaterally.  Gait and station: Gait is normal. Tandem gait is slightly unsteady. Romberg is negative. No drift is seen.  Reflexes: Deep tendon reflexes are symmetric and normal bilaterally.   DIAGNOSTIC DATA (LABS, IMAGING, TESTING) - I reviewed patient records, labs, notes, testing and imaging myself where available.  Lab Results  Component Value Date   WBC 6.6 01/03/2021   HGB 12.7 01/03/2021   HCT 40.0 01/03/2021   MCV 91.7 01/03/2021   PLT 197 01/03/2021      Component Value Date/Time   NA 137 01/03/2021 1211   K 4.1 01/03/2021 1211   CL 105 01/03/2021 1211   CO2 26 01/03/2021 1211   GLUCOSE 96 01/03/2021 1211   BUN 9 01/03/2021 1211   CREATININE 0.77 01/03/2021 1211   CREATININE 0.73 08/20/2019 1211   CALCIUM 10.2 01/03/2021 1211   PROT 8.2 (H) 01/03/2021 1211   ALBUMIN 3.8 01/03/2021 1211   AST 23 01/03/2021 1211   ALT 17 01/03/2021 1211   ALKPHOS 124 01/03/2021 1211   BILITOT 0.6 01/03/2021 1211   GFRNONAA >60 01/03/2021 1211   GFRNONAA 88 08/20/2019 1211   GFRAA 102 08/20/2019 1211   No results found for: CHOL, HDL, LDLCALC, LDLDIRECT, TRIG, CHOLHDL No results found for: HGBA1C Lab Results  Component Value Date   VITAMINB12 547 05/07/2020   No results found for: TSH  ASSESSMENT AND PLAN 65 y.o. year old female  has a past medical history of Arthritis, Bursitis, hip, DDD (degenerative disc disease), lumbar, Fibromyalgia, GERD (gastroesophageal reflux disease), Hashimoto's thyroiditis, Headache, Hyperparathyroidism ([place]), Hypothyroidism, Liver cyst, Osteoarthritis, Parathyroid disorder ([place]),  Spinal stenosis, and Thyroid disease. here with:  1.  Migratory paresthesias, total body, intermittent 2.  Elevated ESR, CRP  -Will check NCV/EMG left upper, left lower due to report continue paresthesia to complete work-up, worsening in early 2021, chronic gait disturbance -MRI of the brain and cervical spine were unremarkable, no source of sensory complaints, felt could be related to fibromyalgia/stress -Will recheck copper level, elevated last visit 198 (80-158) -Seeing rheumatology, oncology, recent CRP improved to 0.8 was 12.2; sed rate 35 was 53 -Will wait to schedule follow-up until after NCV and depending on results  Orders Placed This Encounter  Procedures  . Copper, serum  . NCV with EMG(electromyography)   I spent 30 minutes of face-to-face and non-face-to-face time with patient.  This included previsit chart review, lab review, study review, order entry, electronic health record documentation, patient education.  Butler Denmark, AGNP-C, DNP 01/31/2021, 11:03 AM [facility] [address] [place], [place] [address] [phone]

## 2021-02-01 NOTE — Progress Notes (Signed)
I have read the note, and I agree with the clinical assessment and plan.  Geniece Akers K Rino Hosea   

## 2021-02-02 LAB — COPPER, SERUM: Copper: 175 ug/dL — ABNORMAL HIGH (ref 80–158)

## 2021-02-18 NOTE — Progress Notes (Signed)
Office Visit Note  Patient: Meghan Owen             Date of Birth: [birth year]           MRN: [id number]             PCP: Harlan Stains, MD Referring: Harlan Stains, MD Visit Date: 02/23/2021 Occupation: @GUAROCC @  Subjective:  Sicca symptoms   History of Present Illness: Meghan Owen is a 65 y.o. female with history of Palindromic rheumatism, fibromyalgia, and DDD.  She is not taking any immunosuppressive agents at this time.  Patient reports that a few weeks ago she was experiencing increased pain and stiffness in both hands, both wrist joints, both knee joints, and both ankle joints.  She denies any joint swelling during that time. She continues to have chronic lower back pain.  She did not go to physical therapy as recommended. She has persistent left hip joint pain.  She states that she has post to have a hip replacement in follow-up 2020 but she has not been ready to proceed with surgery during the pandemic. She continues to take tumeric on a daily basis.  She is not taking any other OTC products for pain relief. She has noticed increased mouth and eye dryness recently.  She has not tried any OTC products for symptomatic relief.  She is concerned because she has family history of sjogren's syndrome.  She has also noticed increased hair loss, but she is unsure if it is due to underlying hashimoto's thyroiditis.  She underwent a NCV with EMG performed by Dr. Jannifer Franklin on 02/21/21, which was unremarkable.      Activities of Daily Living:  Patient reports morning stiffness for 30-60 minutes.    Patient Denies nocturnal pain.  Difficulty dressing/grooming: Denies Difficulty climbing stairs: Denies Difficulty getting out of chair: Reports Difficulty using hands for taps, buttons, cutlery, and/or writing: Denies  Review of Systems  Constitutional: Positive for fatigue.  HENT: Positive for mouth dryness and nose dryness. Negative for mouth sores.   Eyes: Positive for itching and  dryness. Negative for pain.  Respiratory: Negative for shortness of breath and difficulty breathing.   Cardiovascular: Positive for palpitations. Negative for chest pain.  Gastrointestinal: Positive for constipation. Negative for blood in stool and diarrhea.  Genitourinary: Negative for difficulty urinating.  Musculoskeletal: Positive for arthralgias, joint pain, myalgias, morning stiffness, muscle tenderness and myalgias. Negative for joint swelling.  Skin: Positive for color change. Negative for rash and redness.  Allergic/Immunologic: Negative for susceptible to infections.  Neurological: Positive for numbness. Negative for memory loss.  Hematological: Positive for bruising/bleeding tendency.  Psychiatric/Behavioral: Positive for sleep disturbance. Negative for confusion.    PMFS History:  Patient Active Problem List   Diagnosis Date Noted   Paresthesia 01/31/2021   Osteoarthritis of both sacroiliac joints ([place]) 01/04/2018   Primary osteoarthritis of both knees 01/04/2018   DDD (degenerative disc disease), cervical 01/04/2018   DDD (degenerative disc disease), lumbar 01/04/2018   Chronic right shoulder pain 01/04/2018   Fibromyalgia 01/04/2018   Hashimoto's thyroiditis 01/04/2018   History of hyperparathyroidism 01/04/2018   History of gastroesophageal reflux (GERD) 01/04/2018   Family history of rheumatoid arthritis 01/04/2018   Cervical myelopathy ([place]) 09/05/2016   Primary osteoarthritis of left hip 01/04/2016   Chronic left hip pain 12/15/2014   Hypercalcemia 09/08/2013   Varus deformity of foot 08/26/2013   Peroneal tendonitis 05/23/2013   Conversion disorder 05/21/2013   Low back pain 11/01/2012   Achilles  tendinitis 09/05/2012   Bilateral ankle pain 09/05/2012   Hammer toe, acquired 09/05/2012   Cubital tunnel syndrome on left 08/22/2012   Stress fracture 07/04/2012   Lumbar facet arthropathy 05/10/2012   Diffuse myofascial pain syndrome  01/25/2012   GERD 02/05/2009   OTHER BURSITIS DISORDERS 02/05/2009   ABNORMAL ELECTROCARDIOGRAM 02/05/2009    Past Medical History:  Diagnosis Date   Arthritis    Bursitis, hip    bilateral   DDD (degenerative disc disease), lumbar    Fibromyalgia    GERD (gastroesophageal reflux disease)    HX   Hashimoto's thyroiditis    Headache    Hyperparathyroidism ([place])    Dr. Elyse Hsu   Hypothyroidism    Liver cyst    BENIGN   Osteoarthritis    Parathyroid disorder ([place])    HYPERPARATHYROID   Spinal stenosis    Thyroid disease     Family History  Problem Relation Age of Onset   Lupus Mother    Rheum arthritis Mother    Dementia Father    Leukemia Father    Cancer Other    Cancer Sister        bone    Healthy Son    Healthy Son    Healthy Daughter    Past Surgical History:  Procedure Laterality Date   ABDOMINAL HYSTERECTOMY     ANKLE SURGERY     ANTERIOR CERVICAL DECOMPRESSION/DISCECTOMY FUSION 4 LEVELS N/A 09/05/2016   Procedure: Cervical three-four Cervical four-five  Cervical five-six Cervical six-seven  Anterior cervical decompression/diskectomy/fusion;  Surgeon: Erline Levine, MD;  Location: [facility];  Service: Neurosurgery;  Laterality: N/A;   CARPAL TUNNEL RELEASE Left 02/26/2018   ELBOW SURGERY  04/2017   cyst removed    FOOT SURGERY     Social History   Social History Narrative   Caffeine yes, widowed.  3 children.     There is no immunization history on file for this patient.   Objective: Vital Signs: BP (!) 141/84 (BP Location: Left Arm, Patient Position: Sitting, Cuff Size: Large)    Pulse 61    Resp 15    Ht $R'5\' 5"'qC$  (1.651 m)    Wt 232 lb (105.2 kg)    BMI 38.61 kg/m    Physical Exam Vitals and nursing note reviewed.  Constitutional:      Appearance: She is well-developed.  HENT:     Head: Normocephalic and atraumatic.     Mouth/Throat:     Comments: No parotid swelling or tenderness.   Eyes:      Conjunctiva/sclera: Conjunctivae normal.  Pulmonary:     Effort: Pulmonary effort is normal.  Abdominal:     Palpations: Abdomen is soft.  Musculoskeletal:     Cervical back: Normal range of motion.  Lymphadenopathy:     Cervical: No cervical adenopathy.  Skin:    General: Skin is warm and dry.     Capillary Refill: Capillary refill takes less than 2 seconds.  Neurological:     Mental Status: She is alert and oriented to person, place, and time.  Psychiatric:        Behavior: Behavior normal.      Musculoskeletal Exam: C-spine, thoracic spine, and lumbar spine with good range of motion with mild midline spinal tenderness in the lumbar region.  Shoulder joints, elbow joints, wrist joints, MCPs, PIPs, DIPs have good range of motion with no synovitis.  She is able to make a complete fist bilaterally.  Left hip has limited range  of motion with discomfort.  Knee joints have good range of motion with no warmth or effusion.  Ankle joints have good range of motion with tenderness bilaterally.   CDAI Exam: CDAI Score: -- Patient Global: --; Provider Global: -- Swollen: 0 ; Tender: 6  Joint Exam 02/23/2021      Right  Left  Lumbar Spine   Tender     Sacroiliac   Tender   Tender  Hip      Tender  Ankle   Tender   Tender     Investigation: No additional findings.  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  She reports some upper neck and shoulder pain on the left.  The patient is being evaluated for possible neuropathy or a cervical radiculopathy. NERVE CONDUCTION STUDIES: Nerve conduction studies were performed on the left upper extremity. The distal motor latencies and motor amplitudes for the median and ulnar nerves were within normal limits. The nerve conduction velocities for these  nerves were also normal. The sensory latencies for the median and ulnar nerves were normal. The F wave latency for the ulnar nerve was within normal limits. Nerve conduction studies were performed on the left lower extremity. The distal motor latencies and motor amplitudes for the peroneal and posterior tibial nerves were within normal limits. The nerve conduction velocities for these nerves were also normal. The sensory latencies for the peroneal and sural nerves were within normal limits. The F wave latency for the posterior tibial nerve was within normal limits. EMG STUDIES: EMG study was performed on the left upper extremity: The first dorsal interosseous muscle reveals 2 to 4 K units with full recruitment. No fibrillations or positive waves were noted. The abductor pollicis brevis muscle reveals 2 to 4 K units with full recruitment. No fibrillations or positive waves were noted. The extensor indicis proprius muscle reveals 1 to 3 K units with full recruitment. No fibrillations or positive waves were noted. The pronator teres muscle reveals 2 to 3 K units with full recruitment. No fibrillations or positive waves were noted. The biceps muscle reveals 1 to 2 K units with full recruitment. No fibrillations or positive waves were noted. The triceps muscle reveals 2 to 4 K units with full recruitment. No fibrillations or positive waves were noted. The anterior deltoid muscle reveals 2 to 3 K units with full recruitment. No fibrillations or positive waves were noted. The cervical paraspinal muscles were tested at 2 levels. No abnormalities of insertional activity were seen at either level tested. There was fair relaxation. IMPRESSION: Nerve conduction studies done on the left upper and left lower extremities were unremarkable, no evidence of a neuropathy is seen.  EMG evaluation of the left upper extremity was unremarkable, no evidence of an overlying cervical radiculopathy was seen. Jill Alexanders MD 02/21/2021 4:34  PM [facility] [address] [place], [place] [address] Phone [phone] Fax [phone]    Recent Labs: Lab Results  Component Value Date   WBC 6.6 01/03/2021   HGB 12.7 01/03/2021   PLT 197 01/03/2021   NA 137 01/03/2021   K 4.1 01/03/2021   CL 105 01/03/2021   CO2 26 01/03/2021   GLUCOSE 96 01/03/2021   BUN 9 01/03/2021   CREATININE 0.77 01/03/2021   BILITOT 0.6 01/03/2021  ALKPHOS 124 01/03/2021   AST 23 01/03/2021   ALT 17 01/03/2021   PROT 8.2 (H) 01/03/2021   ALBUMIN 3.8 01/03/2021   CALCIUM 10.2 01/03/2021   GFRAA 102 08/20/2019    Speciality Comments: No specialty comments available.  Procedures:  No procedures performed Allergies: Aspirin, Other, Contrast media [iodinated diagnostic agents], Iodine-131, Ioxaglate, Penicillins, and Prednisone    Assessment / Plan:     Visit Diagnoses: Palindromic rheumatism - RF-, anti-CCP-, 14-3-3 eta negative, uric acid WNL, Ace WNL: She has no synovitis on exam.  She continues to experience intermittent pain and stiffness in multiple joints.  A few weeks ago she was experiencing pain in both wrist joints, both hands, both knee joints, and both ankle joints.  She has not noticed any joint swelling.  She is not currently taking any immunosuppressive agents.  She has been taking turmeric on a daily basis.  She has not been taking any other over-the-counter products for pain relief.  We discussed that if her hand and wrist pain persists or worsens we will schedule an ultrasound of both hands to assess for synovitis.  Due to the ongoing arthralgias, fatigue, and new onset sicca symptoms we will obtain AVISE lab work.  She will follow up in 4-6 weeks to discuss the results.   Osteoarthritis of both sacroiliac joints ([facility]) - HLA-B27 negative, MRI negative for sacroiliitis. MRI on 01/01/2018 revealed chronic fusion of superior aspect of right SI joint.  She continues to have chronic SI joint with tenderness  to palpation bilaterally.   Elevated sed rate - History of elevated sed rate.  12/21/2017 sed rate: 53, 09/09/2018 sed rate 56, 08/20/2019 sed rate 86, 11/23/20: 53, 01/03/21: 35.  We will obtain AVISE lab work.  Family history of rheumatoid arthritis - She has no clinical features of rheumatoid arthritis at this time.  If she continues to have persistent pain and stiffness in both hands and both wrist joints we will schedule an ultrasound of both hands to assess for synovitis.   Primary osteoarthritis of both knees - Left knee-mild OA with severe chrondomalacia patella.  Right knee- no OA, chondromalacia patella noted.  She continues to have intermittent pain in both knee joints.  She has good ROM on exam today with no discomfort.  No warmth or effusion noted.   DDD (degenerative disc disease), cervical - S/p fusion-Dr. Venetia Maxon 2017: EMG on 02/21/21: evaluation of the left upper extremity was unremarkable, no evidence of an overlying cervical radiculopathy was seen.  DDD (degenerative disc disease), lumbar - X-rays of the lumbar spine were consistent with facet joint arthropathy and mild dextroscoliosis. MRI of lumbar spine performed on 12/20/20: Mild lumbar degenerative disease as described above without central canal or foraminal narrowing.  She continues to have chronic lower back pain and stiffness.  She did not go to physical therapy as recommended.  NCV with EMG performed by Dr. Anne Hahn on 02/21/21: Nerve conduction studies done on the left upper and left lower extremities were unremarkable, no evidence of a neuropathy is seen.    Fibromyalgia: According to the patient she was diagnosed with fibromyalgia by her PCP but she is not confident that she has fibromyalgia.  She has tenderness palpation over the deltoid insertion site and bilateral trapezius muscles on examination today.  Other fatigue: She continues to have ongoing fatigue. AVISE orders provided to the patient.   Dry mouth: She has been  experiencing increased mouth dryness on a daily basis.  She has been unable  to identify a trigger for the new onset mild dryness but at times her symptoms can be severe.  According to the patient her mother had Sjogren's syndrome so she is concerned about the diagnosis.  She has not parotid swelling or tenderness on exam. We discussed the use of over-the-counter products including Biotene as well as Tylenol.  We also discussed increasing her fluid intake.  AVISE lab work will be obtained for further evaluation.   Dry eyes: She has been experiencing increased eye dryness recently.  She describes it as a gritty feeling.  She has not followed up with her ophthalmologist recently.  We discussed the use of over-the-counter eyedrops for symptomatic relief.  She was also encouraged to schedule an appointment with her ophthalmologist. AVISE orders provided to the patient today.   Other medical conditions are listed as follows:   History of gastroesophageal reflux (GERD)  Hashimoto's thyroiditis - Followed by Dr. Buddy Duty.  History of hyperparathyroidism - She is followed by Dr. Buddy Duty.  Orders: No orders of the defined types were placed in this encounter.  No orders of the defined types were placed in this encounter.     Follow-Up Instructions: Return for Palindromic rheumatism, Fibromyalgia, DDD.   Ofilia Neas, PA-C  Note - This record has been created using Dragon software.  Chart creation errors have been sought, but may not always  have been located. Such creation errors do not reflect on  the standard of medical care.

## 2021-02-21 ENCOUNTER — Ambulatory Visit: Payer: BC Managed Care – PPO | Admitting: Neurology

## 2021-02-21 ENCOUNTER — Encounter: Payer: Self-pay | Admitting: Neurology

## 2021-02-21 ENCOUNTER — Ambulatory Visit (INDEPENDENT_AMBULATORY_CARE_PROVIDER_SITE_OTHER): Payer: BC Managed Care – PPO | Admitting: Neurology

## 2021-02-21 DIAGNOSIS — R202 Paresthesia of skin: Secondary | ICD-10-CM

## 2021-02-21 DIAGNOSIS — M797 Fibromyalgia: Secondary | ICD-10-CM

## 2021-02-21 NOTE — Progress Notes (Addendum)
Patient comes in for EMG and nerve conduction study evaluation.  The patient has had migratory sensation alterations, she does report some left neck and shoulder pain.  Nerve conductions on the left arm and leg were normal, EMG of the left arm is normal.    MNC    Nerve / Sites Muscle Latency Ref. Amplitude Ref. Rel Amp Segments Distance Velocity Ref. Area    ms ms mV mV %  cm m/s m/s mVms  L Median - APB     Wrist APB 3.7 ?4.4 7.3 ?4.0 100 Wrist - APB 7   30.4     Upper arm APB 7.6  7.2  99 Upper arm - Wrist 22 56 ?49 29.4  L Ulnar - ADM     Wrist ADM 3.3 ?3.3 13.0 ?6.0 100 Wrist - ADM 7   43.7     B.Elbow ADM 6.2  12.1  93.6 B.Elbow - Wrist 19 67 ?49 41.4     A.Elbow ADM 7.8  11.6  95.9 A.Elbow - B.Elbow 10 61 ?49 44.9  L Peroneal - EDB     Ankle EDB 4.7 ?6.5 6.4 ?2.0 100 Ankle - EDB 9   18.8     Fib head EDB 10.9  5.4  83.8 Fib head - Ankle 29 47 ?44 16.3     Pop fossa EDB 13.1  4.8  89.8 Pop fossa - Fib head 10 45 ?44 15.6         Pop fossa - Ankle      L Tibial - AH     Ankle AH 5.1 ?5.8 8.6 ?4.0 100 Ankle - AH 9   29.2     Pop fossa AH 13.5  7.8  90.9 Pop fossa - Ankle 35 42 ?41 27.5             SNC    Nerve / Sites Rec. Site Peak Lat Ref.  Amp Ref. Segments Distance    ms ms V V  cm  L Sural - Ankle (Calf)     Calf Ankle 4.2 ?4.4 17 ?6 Calf - Ankle 14  L Superficial peroneal - Ankle     Lat leg Ankle 3.8 ?4.4 9 ?6 Lat leg - Ankle 14  L Median - Orthodromic (Dig II, Mid palm)     Dig II Wrist 2.9 ?3.4 27 ?10 Dig II - Wrist 13  L Ulnar - Orthodromic, (Dig V, Mid palm)     Dig V Wrist 2.9 ?3.1 9 ?5 Dig V - Wrist 3             F  Wave    Nerve F Lat Ref.   ms ms  L Tibial - AH 52.1 ?56.0  L Ulnar - ADM 28.6 ?32.0

## 2021-02-21 NOTE — Progress Notes (Signed)
Please refer to EMG and nerve conduction procedure note.  

## 2021-02-21 NOTE — Procedures (Signed)
     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  She reports some upper neck and shoulder pain on the left.  The patient is being evaluated for possible neuropathy or a cervical radiculopathy.  NERVE CONDUCTION STUDIES:  Nerve conduction studies were performed on the left upper extremity. The distal motor latencies and motor amplitudes for the median and ulnar nerves were within normal limits. The nerve conduction velocities for these nerves were also normal. The sensory latencies for the median and ulnar nerves were normal. The F wave latency for the ulnar nerve was within normal limits.  Nerve conduction studies were performed on the left lower extremity. The distal motor latencies and motor amplitudes for the peroneal and posterior tibial nerves were within normal limits. The nerve conduction velocities for these nerves were also normal. The sensory latencies for the peroneal and sural nerves were within normal limits. The F wave latency for the posterior tibial nerve was within normal limits.   EMG STUDIES:  EMG study was performed on the left upper extremity:  The first dorsal interosseous muscle reveals 2 to 4 K units with full recruitment. No fibrillations or positive waves were noted. The abductor pollicis brevis muscle reveals 2 to 4 K units with full recruitment. No fibrillations or positive waves were noted. The extensor indicis proprius muscle reveals 1 to 3 K units with full recruitment. No fibrillations or positive waves were noted. The pronator teres muscle reveals 2 to 3 K units with full recruitment. No fibrillations or positive waves were noted. The biceps muscle reveals 1 to 2 K units with full recruitment. No fibrillations or positive waves were noted. The triceps muscle reveals 2 to 4 K units with full recruitment. No fibrillations or positive waves were noted. The anterior  deltoid muscle reveals 2 to 3 K units with full recruitment. No fibrillations or positive waves were noted. The cervical paraspinal muscles were tested at 2 levels. No abnormalities of insertional activity were seen at either level tested. There was fair relaxation.   IMPRESSION:  Nerve conduction studies done on the left upper and left lower extremities were unremarkable, no evidence of a neuropathy is seen.  EMG evaluation of the left upper extremity was unremarkable, no evidence of an overlying cervical radiculopathy was seen.  Jill Alexanders MD 02/21/2021 4:34 PM  [facility] [address] [place], [place] [address]  Phone [phone] Fax [phone]

## 2021-02-23 ENCOUNTER — Encounter: Payer: Self-pay | Admitting: Physician Assistant

## 2021-02-23 ENCOUNTER — Other Ambulatory Visit: Payer: Self-pay

## 2021-02-23 ENCOUNTER — Ambulatory Visit: Payer: BC Managed Care – PPO | Admitting: Physician Assistant

## 2021-02-23 VITALS — BP 141/84 | HR 61 | Resp 15 | Ht 65.0 in | Wt 232.0 lb

## 2021-02-23 DIAGNOSIS — M1230 Palindromic rheumatism, unspecified site: Secondary | ICD-10-CM

## 2021-02-23 DIAGNOSIS — R682 Dry mouth, unspecified: Secondary | ICD-10-CM

## 2021-02-23 DIAGNOSIS — M461 Sacroiliitis, not elsewhere classified: Secondary | ICD-10-CM | POA: Diagnosis not present

## 2021-02-23 DIAGNOSIS — Z8719 Personal history of other diseases of the digestive system: Secondary | ICD-10-CM

## 2021-02-23 DIAGNOSIS — M5030 Other cervical disc degeneration, unspecified cervical region: Secondary | ICD-10-CM

## 2021-02-23 DIAGNOSIS — M797 Fibromyalgia: Secondary | ICD-10-CM

## 2021-02-23 DIAGNOSIS — Z8261 Family history of arthritis: Secondary | ICD-10-CM

## 2021-02-23 DIAGNOSIS — G8929 Other chronic pain: Secondary | ICD-10-CM

## 2021-02-23 DIAGNOSIS — R700 Elevated erythrocyte sedimentation rate: Secondary | ICD-10-CM | POA: Diagnosis not present

## 2021-02-23 DIAGNOSIS — E063 Autoimmune thyroiditis: Secondary | ICD-10-CM

## 2021-02-23 DIAGNOSIS — M170 Bilateral primary osteoarthritis of knee: Secondary | ICD-10-CM

## 2021-02-23 DIAGNOSIS — M5136 Other intervertebral disc degeneration, lumbar region: Secondary | ICD-10-CM

## 2021-02-23 DIAGNOSIS — Z8639 Personal history of other endocrine, nutritional and metabolic disease: Secondary | ICD-10-CM

## 2021-02-23 DIAGNOSIS — H04123 Dry eye syndrome of bilateral lacrimal glands: Secondary | ICD-10-CM

## 2021-02-23 DIAGNOSIS — R5383 Other fatigue: Secondary | ICD-10-CM

## 2021-02-23 DIAGNOSIS — M51369 Other intervertebral disc degeneration, lumbar region without mention of lumbar back pain or lower extremity pain: Secondary | ICD-10-CM

## 2021-02-28 ENCOUNTER — Other Ambulatory Visit: Payer: Self-pay | Admitting: Internal Medicine

## 2021-02-28 DIAGNOSIS — E210 Primary hyperparathyroidism: Secondary | ICD-10-CM

## 2021-02-28 DIAGNOSIS — M1230 Palindromic rheumatism, unspecified site: Secondary | ICD-10-CM | POA: Diagnosis not present

## 2021-02-28 DIAGNOSIS — Z13820 Encounter for screening for osteoporosis: Secondary | ICD-10-CM

## 2021-02-28 DIAGNOSIS — R5383 Other fatigue: Secondary | ICD-10-CM | POA: Diagnosis not present

## 2021-02-28 DIAGNOSIS — R700 Elevated erythrocyte sedimentation rate: Secondary | ICD-10-CM | POA: Diagnosis not present

## 2021-03-03 DIAGNOSIS — E063 Autoimmune thyroiditis: Secondary | ICD-10-CM | POA: Diagnosis not present

## 2021-03-03 DIAGNOSIS — R002 Palpitations: Secondary | ICD-10-CM | POA: Diagnosis not present

## 2021-03-03 DIAGNOSIS — E210 Primary hyperparathyroidism: Secondary | ICD-10-CM | POA: Diagnosis not present

## 2021-03-03 DIAGNOSIS — R700 Elevated erythrocyte sedimentation rate: Secondary | ICD-10-CM | POA: Diagnosis not present

## 2021-03-16 NOTE — Progress Notes (Addendum)
Office Visit Note  Patient: Meghan Owen             Date of Birth: 1956-08-03           MRN: [id number]             PCP: Harlan Stains, MD Referring: Harlan Stains, MD Visit Date: 03/28/2021 Occupation: @GUAROCC @  Subjective:  Discuss AVISE  History of Present Illness: Meghan Owen is a 65 y.o. female with history of osteoarthritis, fibromyalgia, and DDD.  She presents today to discuss [place] lab work.  She continues to have pain in both hands, both wrist joints, knee joints, and both ankle joints.  She has noticed intermittent swelling in her hands and ankle joints.  She had difficulty getting out of bed due to the pain and stiffness.  Her most recent flare was 2 weeks ago.  She continues to have chronic sicca symptoms.  She denies any oral or nasal ulcerations.  She denies any rashes recently.  She continues to have intermittent symptoms of raynaud's in there fingers and toes.  She continues notice hair thinning.  She is unsure how many of her symptoms are due to underlying hashimoto's. She will be following up with her endocrinologist in June 2022. She is also scheduled for an upcoming with cardiology for evaluation of the palpitations she has been experiencing.   Activities of Daily Living:  Patient reports morning stiffness for 1 hour.   Patient Reports nocturnal pain.  Difficulty dressing/grooming: Denies Difficulty climbing stairs: Denies Difficulty getting out of chair: Reports Difficulty using hands for taps, buttons, cutlery, and/or writing: Denies  Review of Systems  Constitutional: Positive for fatigue.  HENT: Positive for mouth dryness and nose dryness. Negative for mouth sores.   Eyes: Positive for pain and redness. Negative for itching, visual disturbance and dryness.  Respiratory: Negative for cough, hemoptysis, shortness of breath and difficulty breathing.   Cardiovascular: Positive for palpitations. Negative for hypertension and swelling in legs/feet.   Gastrointestinal: Positive for constipation. Negative for blood in stool and diarrhea.  Genitourinary: Negative for difficulty urinating and painful urination.  Musculoskeletal: Positive for arthralgias, joint pain, joint swelling, myalgias, morning stiffness, muscle tenderness and myalgias. Negative for muscle weakness.  Skin: Positive for color change. Negative for pallor, rash, hair loss, nodules/bumps, redness, skin tightness, ulcers and sensitivity to sunlight.  Allergic/Immunologic: Negative for susceptible to infections.  Neurological: Positive for headaches. Negative for dizziness and weakness.  Hematological: Positive for bruising/bleeding tendency. Negative for swollen glands.  Psychiatric/Behavioral: Negative for depressed mood, confusion and sleep disturbance. The patient is not nervous/anxious.     PMFS History:  Patient Active Problem List   Diagnosis Date Noted  . Paresthesia 01/31/2021  . Osteoarthritis of both sacroiliac joints ([place]) 01/04/2018  . Primary osteoarthritis of both knees 01/04/2018  . DDD (degenerative disc disease), cervical 01/04/2018  . DDD (degenerative disc disease), lumbar 01/04/2018  . Chronic right shoulder pain 01/04/2018  . Fibromyalgia 01/04/2018  . Hashimoto's thyroiditis 01/04/2018  . History of hyperparathyroidism 01/04/2018  . History of gastroesophageal reflux (GERD) 01/04/2018  . Family history of rheumatoid arthritis 01/04/2018  . Cervical myelopathy ([place]) 09/05/2016  . Primary osteoarthritis of left hip 01/04/2016  . Chronic left hip pain 12/15/2014  . Hypercalcemia 09/08/2013  . Varus deformity of foot 08/26/2013  . Peroneal tendonitis 05/23/2013  . Conversion disorder 05/21/2013  . Low back pain 11/01/2012  . Achilles tendinitis 09/05/2012  . Bilateral ankle pain 09/05/2012  . Hammer toe, acquired  09/05/2012  . Cubital tunnel syndrome on left 08/22/2012  . Stress fracture 07/04/2012  . Lumbar facet arthropathy 05/10/2012  .  Diffuse myofascial pain syndrome 01/25/2012  . GERD 02/05/2009  . OTHER BURSITIS DISORDERS 02/05/2009  . ABNORMAL ELECTROCARDIOGRAM 02/05/2009    Past Medical History:  Diagnosis Date  . Arthritis   . Bursitis, hip    bilateral  . DDD (degenerative disc disease), lumbar   . Fibromyalgia   . GERD (gastroesophageal reflux disease)    HX  . Hashimoto's thyroiditis   . Headache   . Hyperparathyroidism ([place])    Dr. Elyse Hsu  . Hypothyroidism   . Liver cyst    BENIGN  . Osteoarthritis   . Parathyroid disorder ([place])    HYPERPARATHYROID  . Spinal stenosis   . Thyroid disease     Family History  Problem Relation Age of Onset  . Lupus Mother   . Rheum arthritis Mother   . Dementia Father   . Leukemia Father   . Cancer Other   . Cancer Sister        bone   . Healthy Son   . Healthy Son   . Healthy Daughter    Past Surgical History:  Procedure Laterality Date  . ABDOMINAL HYSTERECTOMY    . ANKLE SURGERY    . ANTERIOR CERVICAL DECOMPRESSION/DISCECTOMY FUSION 4 LEVELS N/A 09/05/2016   Procedure: Cervical three-four Cervical four-five  Cervical five-six Cervical six-seven  Anterior cervical decompression/diskectomy/fusion;  Surgeon: Erline Levine, MD;  Location: [facility];  Service: Neurosurgery;  Laterality: N/A;  . CARPAL TUNNEL RELEASE Left 02/26/2018  . ELBOW SURGERY  04/2017   cyst removed   . FOOT SURGERY     Social History   Social History Narrative   Caffeine yes, widowed.  3 children.     There is no immunization history on file for this patient.   Objective: Vital Signs: BP (!) 150/83 (BP Location: Left Arm, Patient Position: Sitting, Cuff Size: Large)   Pulse (!) 59   Resp 16   Ht $R'5\' 5"'BR$  (1.651 m)   Wt 231 lb (104.8 kg) Comment: per patient, patient refused to weigh  BMI 38.44 kg/m    Physical Exam Vitals and nursing note reviewed.  Constitutional:      Appearance: She is well-developed.  HENT:     Head: Normocephalic and atraumatic.  Eyes:      Conjunctiva/sclera: Conjunctivae normal.  Pulmonary:     Effort: Pulmonary effort is normal.  Abdominal:     Palpations: Abdomen is soft.  Musculoskeletal:     Cervical back: Normal range of motion.  Skin:    General: Skin is warm and dry.     Capillary Refill: Capillary refill takes less than 2 seconds.  Neurological:     Mental Status: She is alert and oriented to person, place, and time.  Psychiatric:        Behavior: Behavior normal.      Musculoskeletal Exam: Generalized hyperalgesia and positive tender points.  C-spine good ROM.  Trapezius muscle tension and tenderness bilaterally.  Discomfort with thoracic and lumbar spine ROM. Shoulder joints, elbow joints, wrist joints, MCPs, PIPs, and DIPs good ROM with no synovitis. Complete fist formation bilaterally.  Tenderness over the left CMC joint.  Tenderness over the left 1st, 2nd, and 3rd MCP joints.  Painful and limited ROM of the left hip joint.  Right hip has good ROM with no discomfort.  Knee joints good ROM with no warmth or effusion.  Tenderness over both ankle joints.   CDAI Exam: CDAI Score: 7.8  Patient Global: 5 mm; Provider Global: 3 mm Swollen: 0 ; Tender: 10  Joint Exam 03/28/2021      Right  Left  CMC      Tender  MCP 1      Tender  MCP 2      Tender  MCP 3      Tender  PIP 2   Tender     PIP 3   Tender     PIP 4   Tender     PIP 5   Tender     Ankle   Tender   Tender     Investigation: No additional findings.  Imaging: XR Foot 2 Views Left  Result Date: 03/28/2021 PIP and DIP narrowing was noted.  No MCP, tibiotalar or subtalar narrowing was noted.  Intertarsal narrowing with dorsal spurring was noted.  No erosive changes were noted. Impression: These findings are consistent with osteoarthritis of the foot.  XR Foot 2 Views Right  Result Date: 03/28/2021 PIP and DIP narrowing was noted.  Right second MTP narrowing was noted.  A screw was noted in the right second metatarsal head.  Intertarsal  narrowing and dorsal spurring was noted.  No tibiotalar or subtalar joint space narrowing was noted. An screw was noted in the calcaneum.  Posterior inferior calcaneal spurs were noted. Impression: These findings are consistent with osteoarthritis of the foot and postsurgical changes.  XR Hand 2 View Left  Result Date: 03/28/2021 [facility] and PIP narrowing was noted.  No MCP, intercarpal or radiocarpal joint space narrowing was noted.  No DIP narrowing was noted.  No erosive changes were noted. Impression: These findings are consistent with mild osteoarthritis of the hand.  XR Hand 2 View Right  Result Date: 03/28/2021 CMC, PIP narrowing was noted.  No MCP, intercarpal or radiocarpal joint space narrowing was noted.  No erosive changes were noted. Impression: These findings are consistent with early osteoarthritic changes.   Recent Labs: Lab Results  Component Value Date   WBC 6.6 01/03/2021   HGB 12.7 01/03/2021   PLT 197 01/03/2021   NA 137 01/03/2021   K 4.1 01/03/2021   CL 105 01/03/2021   CO2 26 01/03/2021   GLUCOSE 96 01/03/2021   BUN 9 01/03/2021   CREATININE 0.77 01/03/2021   BILITOT 0.6 01/03/2021   ALKPHOS 124 01/03/2021   AST 23 01/03/2021   ALT 17 01/03/2021   PROT 8.2 (H) 01/03/2021   ALBUMIN 3.8 01/03/2021   CALCIUM 10.2 01/03/2021   GFRAA 102 08/20/2019    Speciality Comments: No specialty comments available.  Procedures:  No procedures performed Allergies: Aspirin, Other, Contrast media [iodinated diagnostic agents], Iodine-131, Ioxaglate, Penicillins, and Prednisone   Assessment / Plan:     Visit Diagnoses: Rheumatoid arthritis of multiple sites with negative rheumatoid factor (HCC) - RF-, Anti-CCP-, Anti-CarP+, family history of rheumatoid arthritis: She has tenderness and discomfort in multiple joints including both wrist joints, both hands, and both ankle joints.  No synovitis was noted on exam.  She brought pictures of swelling in both ankles and over MCPs and  PIP joints for about 2 weeks ago. X-rays of both hands and feet were obtained today.  We discussed AV[facility] lab work from 02/28/21 today in detail: Index -2.2, Anti-RNA Pol III equivocal, anti-thyroglobulin+, anti-thyroid peroxidase+, and Anti-CarP+.  Discussed that a positive CarP antibody can be associated with increased risk for more severe RA disease  progression. Due to her worsening joint pain and inflammation, family history of RA, and positive CarP antibody we discussed trying a trial of plaquenil.  Indications, contraindications, and potential side effects of PLQ were discussed. All questions were addressed and consent was obtained. She would like to discuss starting on PLQ with her ophthalmologist and cardiologist prior to starting. Her dose will be 200 mg 1 tablet by mouth twice daily Monday through Friday.  She will require lab work 1 month, 3 months, then every 5 months after starting on PLQ.  She will notify us once she would like to start on PLQ.  She will follow up in 2 months.   - Plan: XR Hand 2 View Left, XR Hand 2 View Right, XR Foot 2 Views Right, XR Foot 2 Views Left  Patient was counseled on the purpose, proper use, and adverse effects of hydroxychloroquine including nausea/diarrhea, skin rash, headaches, and sun sensitivity.  Discussed importance of annual eye exams while on hydroxychloroquine to monitor to ocular toxicity and discussed importance of frequent laboratory monitoring.  Provided patient with eye exam form for baseline ophthalmologic exam.  Provided patient with educational materials on hydroxychloroquine and answered all questions.  Patient consented to hydroxychloroquine.  Will upload consent in the media tab.    Dose will be Plaquenil 200 mg twice daily Monday through Friday.  Prescription pending lab results.  High risk medication use - She will be starting on Plaquenil 200 mg 1 tablet by mouth twice daily Monday through Friday.  She will require a baseline PLQ eye exam.  She  plans on discussing starting on PLQ with her ophthamologist and cardiologist prior to starting on PLQ.  CBC and CMP were updated on 01/03/21.  She will require updated lab work 1 month, 3 months, then every 5 months after starting on PLQ. Standing orders placed today. Plan: CBC with Differential/Platelet, COMPLETE METABOLIC PANEL WITH GFR  Pain in both hands -She has been experiencing increased pain and intermittent swelling in both hands.  X-rays of both hands updated today.  Plan: XR Hand 2 View Left, XR Hand 2 View Right  Primary osteoarthritis of both knees - Left knee-mild OA with severe chrondomalacia patella.  Right knee- no OA, chondromalacia patella noted.  Pain in both feet -She has been experiencing intermittent pain and swelling in both ankle joints.  X-rays of both feet were obtained today. Plan: XR Foot 2 Views Right, XR Foot 2 Views Left  Fibromyalgia: She has generalized hyperalgesia and positive tender points.   DDD (degenerative disc disease), cervical - S/p fusion-Dr. Stern 2017: EMG on 02/21/21: evaluation of the left upper extremity was unremarkable.  Osteoarthritis of both sacroiliac joints (HCC) - HLA-B27 negative, MRI negative for sacroiliitis. MRI on 01/01/2018 revealed chronic fusion of superior aspect of right SI joint.  Elevated sed rate - History of elevated sed rate. ESR was 35 on 01/03/21.   Family history of rheumatoid arthritis  DDD (degenerative disc disease), lumbar - X-rays of the lumbar spine were consistent with facet joint arthropathy and mild dextroscoliosis. MRI of lumbar spine performed on 12/20/20.  Chronic pain.   Other fatigue: Stable  Dry mouth: Chronic   Dry eyes: Chronic, unchanged.   Hashimoto's thyroiditis - Followed by Dr. Buddy Duty.  History of hyperparathyroidism - She is followed by Dr. Buddy Duty.  History of gastroesophageal reflux (GERD)  Orders: Orders Placed This Encounter  Procedures  . XR Hand 2 View Left  . XR Hand 2 View Right  . XR  Foot 2 Views Right  . XR Foot 2 Views Left  . CBC with Differential/Platelet  . COMPLETE METABOLIC PANEL WITH GFR   No orders of the defined types were placed in this encounter.    Follow-Up Instructions: Return in about 2 months (around 05/28/2021) for Osteoarthritis, Fibromyalgia, DDD.   Ofilia Neas, PA-C  Note - This record has been created using Dragon software.  Chart creation errors have been sought, but may not always  have been located. Such creation errors do not reflect on  the standard of medical care.

## 2021-03-28 ENCOUNTER — Ambulatory Visit: Payer: Self-pay

## 2021-03-28 ENCOUNTER — Ambulatory Visit: Payer: BC Managed Care – PPO | Admitting: Physician Assistant

## 2021-03-28 ENCOUNTER — Other Ambulatory Visit: Payer: Self-pay

## 2021-03-28 ENCOUNTER — Encounter: Payer: Self-pay | Admitting: Physician Assistant

## 2021-03-28 ENCOUNTER — Telehealth: Payer: Self-pay

## 2021-03-28 VITALS — BP 150/83 | HR 59 | Resp 16 | Ht 65.0 in | Wt 231.0 lb

## 2021-03-28 DIAGNOSIS — M79671 Pain in right foot: Secondary | ICD-10-CM

## 2021-03-28 DIAGNOSIS — M797 Fibromyalgia: Secondary | ICD-10-CM

## 2021-03-28 DIAGNOSIS — M79672 Pain in left foot: Secondary | ICD-10-CM | POA: Diagnosis not present

## 2021-03-28 DIAGNOSIS — M79641 Pain in right hand: Secondary | ICD-10-CM | POA: Diagnosis not present

## 2021-03-28 DIAGNOSIS — M5136 Other intervertebral disc degeneration, lumbar region: Secondary | ICD-10-CM

## 2021-03-28 DIAGNOSIS — Z79899 Other long term (current) drug therapy: Secondary | ICD-10-CM | POA: Diagnosis not present

## 2021-03-28 DIAGNOSIS — Z8261 Family history of arthritis: Secondary | ICD-10-CM

## 2021-03-28 DIAGNOSIS — M0609 Rheumatoid arthritis without rheumatoid factor, multiple sites: Secondary | ICD-10-CM | POA: Diagnosis not present

## 2021-03-28 DIAGNOSIS — M170 Bilateral primary osteoarthritis of knee: Secondary | ICD-10-CM | POA: Diagnosis not present

## 2021-03-28 DIAGNOSIS — H04123 Dry eye syndrome of bilateral lacrimal glands: Secondary | ICD-10-CM

## 2021-03-28 DIAGNOSIS — E063 Autoimmune thyroiditis: Secondary | ICD-10-CM

## 2021-03-28 DIAGNOSIS — R700 Elevated erythrocyte sedimentation rate: Secondary | ICD-10-CM

## 2021-03-28 DIAGNOSIS — R5383 Other fatigue: Secondary | ICD-10-CM

## 2021-03-28 DIAGNOSIS — Z8719 Personal history of other diseases of the digestive system: Secondary | ICD-10-CM

## 2021-03-28 DIAGNOSIS — M79642 Pain in left hand: Secondary | ICD-10-CM

## 2021-03-28 DIAGNOSIS — M461 Sacroiliitis, not elsewhere classified: Secondary | ICD-10-CM

## 2021-03-28 DIAGNOSIS — M1230 Palindromic rheumatism, unspecified site: Secondary | ICD-10-CM

## 2021-03-28 DIAGNOSIS — R682 Dry mouth, unspecified: Secondary | ICD-10-CM

## 2021-03-28 DIAGNOSIS — M5030 Other cervical disc degeneration, unspecified cervical region: Secondary | ICD-10-CM

## 2021-03-28 DIAGNOSIS — Z8639 Personal history of other endocrine, nutritional and metabolic disease: Secondary | ICD-10-CM

## 2021-03-28 NOTE — Patient Instructions (Addendum)
Labs in 1 month after you see your cardiologist  Standing Labs We placed an order today for your standing lab work.   Please have your standing labs drawn in 1 month, 3 months, and every 5 months   If possible, please have your labs drawn 2 weeks prior to your appointment so that the provider can discuss your results at your appointment.  We have open lab daily Monday through Thursday from 1:30-4:30 PM and Friday from 1:30-4:00 PM at the office of Dr. Bo Merino, [facility].   Please be advised, all patients with office appointments requiring lab work will take precedents over walk-in lab work.  If possible, please come for your lab work on Monday and Friday afternoons, as you may experience shorter wait times. The office is located at [address] [place], [place], [place] [address] No appointment is necessary.   Labs are drawn by [facility]. Please bring your co-pay at the time of your lab draw.  You may receive a bill from [facility] for your lab work.  If you wish to have your labs drawn at another location, please call the office 24 hours in advance to send orders.  If you have any questions regarding directions or hours of operation,  please call [phone].   As a reminder, please drink plenty of water prior to coming for your lab work. Thanks!    Hydroxychloroquine tablets What is this medicine? HYDROXYCHLOROQUINE (hye drox ee KLOR oh kwin) is used to treat rheumatoid arthritis and systemic lupus erythematosus. It is also used to treat malaria. This medicine may be used for other purposes; ask your health care provider or pharmacist if you have questions. COMMON BRAND NAME(S): Plaquenil, Quineprox What should I tell my health care provider before I take this medicine? They need to know if you have any of these conditions:  diabetes  eye disease, vision problems  G6PD deficiency  heart disease  history of irregular heartbeat  if you often drink  alcohol  kidney disease  liver disease  porphyria  psoriasis  an unusual or allergic reaction to chloroquine, hydroxychloroquine, other medicines, foods, dyes, or preservatives  pregnant or trying to get pregnant  breast-feeding How should I use this medicine? Take this medicine by mouth with a glass of water. Take it as directed on the prescription label. Do not cut, crush or chew this medicine. Swallow the tablets whole. Take it with food. Do not take it more than directed. Take all of this medicine unless your health care provider tells you to stop it early. Keep taking it even if you think you are better. Take products with antacids in them at a different time of day than this medicine. Take this medicine 4 hours before or 4 hours after antacids. Talk to your health care provider if you have questions. Talk to your pediatrician regarding the use of this medicine in children. While this drug may be prescribed for selected conditions, precautions do apply. Overdosage: If you think you have taken too much of this medicine contact a poison control center or emergency room at once. NOTE: This medicine is only for you. Do not share this medicine with others. What if I miss a dose? If you miss a dose, take it as soon as you can. If it is almost time for your next dose, take only that dose. Do not take double or extra doses. What may interact with this medicine? Do not take this medicine with any of the following  medications:  cisapride  dronedarone  pimozide  thioridazine This medicine may also interact with the following medications:  ampicillin  antacids  cimetidine  cyclosporine  digoxin  kaolin  medicines for diabetes, like insulin, glipizide, glyburide  medicines for seizures like carbamazepine, phenobarbital, phenytoin  mefloquine  methotrexate  other medicines that prolong the QT interval (cause an abnormal heart rhythm)  praziquantel This list may not  describe all possible interactions. Give your health care provider a list of all the medicines, herbs, non-prescription drugs, or dietary supplements you use. Also tell them if you smoke, drink alcohol, or use illegal drugs. Some items may interact with your medicine. What should I watch for while using this medicine? Visit your health care provider for regular checks on your progress. Tell your health care provider if your symptoms do not start to get better or if they get worse. You may need blood work done while you are taking this medicine. If you take other medicines that can affect heart rhythm, you may need more testing. Talk to your health care provider if you have questions. Your vision may be tested before and during use of this medicine. Tell your health care provider right away if you have any change in your eyesight. This medicine may cause serious skin reactions. They can happen weeks to months after starting the medicine. Contact your health care provider right away if you notice fevers or flu-like symptoms with a rash. The rash may be red or purple and then turn into blisters or peeling of the skin. Or, you might notice a red rash with swelling of the face, lips or lymph nodes in your neck or under your arms. If you or your family notice any changes in your behavior, such as new or worsening depression, thoughts of harming yourself, anxiety, or other unusual or disturbing thoughts, or memory loss, call your health care provider right away. What side effects may I notice from receiving this medicine? Side effects that you should report to your doctor or health care professional as soon as possible:  allergic reactions (skin rash, itching or hives; swelling of the face, lips, or tongue)  changes in vision  decreased hearing, ringing in the ears  heartbeat rhythm changes (trouble breathing; chest pain; dizziness; fast, irregular heartbeat; feeling faint or lightheaded, falls)  liver  injury (dark yellow or brown urine; general ill feeling or flu-like symptoms; loss of appetite, right upper belly pain; unusually weak or tired, yellowing of the eyes or skin)  low blood sugar (feeling anxious; confusion; dizziness; increased hunger; unusually weak or tired; increased sweating; shakiness; cold, clammy skin; irritable; headache; blurred vision; fast heartbeat; loss of consciousness)  low red blood cell counts (trouble breathing; feeling faint; lightheaded, falls; unusually weak or tired)  muscle weakness  pain, tingling, numbness in the hands or feet  rash, fever, and swollen lymph nodes  redness, blistering, peeling or loosening of the skin, including inside the mouth  suicidal thoughts, mood changes  uncontrollable head, mouth, neck, arm, or leg movements  unusual bruising or bleeding Side effects that usually do not require medical attention (report to your doctor or health care professional if they continue or are bothersome):  diarrhea  hair loss  irritable This list may not describe all possible side effects. Call your doctor for medical advice about side effects. You may report side effects to FDA at 1-800-FDA-1088. Where should I keep my medicine? Keep out of the reach of children and pets. Store at room  temperature up to 30 degrees C (86 degrees F). Protect from light. Get rid of any unused medicine after the expiration date. To get rid of medicines that are no longer needed or have expired:  Take the medicine to a medicine take-back program. Check with your pharmacy or law enforcement to find a location.  If you cannot return the medicine, check the label or package insert to see if the medicine should be thrown out in the garbage or flushed down the toilet. If you are not sure, ask your health care provider. If it is safe to put it in the trash, empty the medicine out of the container. Mix the medicine with cat litter, dirt, coffee grounds, or other  unwanted substance. Seal the mixture in a bag or container. Put it in the trash. NOTE: This sheet is a summary. It may not cover all possible information. If you have questions about this medicine, talk to your doctor, pharmacist, or health care provider.  2021 Elsevier/Gold Standard (2020-05-17 15:07:49)

## 2021-03-28 NOTE — Telephone Encounter (Signed)
[name] discussed restarting plaquenil with patient today. The patient will call the office when she is ready to re-start.

## 2021-03-28 NOTE — Progress Notes (Signed)
X-rays of both hands are consistent with osteoarthritis.  No erosive changes noted.   Please notify the patient of these results.  She will notify us when she is ready to start on plaquenil.

## 2021-03-28 NOTE — Progress Notes (Signed)
Pharmacy Note  Subjective: Patient presents today to [facility] for follow up office visit.   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  She was previously on hydroxychloroquine but couldn't determine for how long - but only for short period of time  Objective: CMP     Component Value Date/Time   NA 137 01/03/2021 1211   K 4.1 01/03/2021 1211   CL 105 01/03/2021 1211   CO2 26 01/03/2021 1211   GLUCOSE 96 01/03/2021 1211   BUN 9 01/03/2021 1211   CREATININE 0.77 01/03/2021 1211   CREATININE 0.73 08/20/2019 1211   CALCIUM 10.2 01/03/2021 1211   PROT 8.2 (H) 01/03/2021 1211   ALBUMIN 3.8 01/03/2021 1211   AST 23 01/03/2021 1211   ALT 17 01/03/2021 1211   ALKPHOS 124 01/03/2021 1211   BILITOT 0.6 01/03/2021 1211   GFRNONAA >60 01/03/2021 1211   GFRNONAA 88 08/20/2019 1211   GFRAA 102 08/20/2019 1211    CBC    Component Value Date/Time   WBC 6.6 01/03/2021 1211   RBC 4.36 01/03/2021 1211   HGB 12.7 01/03/2021 1211   HCT 40.0 01/03/2021 1211   PLT 197 01/03/2021 1211   MCV 91.7 01/03/2021 1211   MCH 29.1 01/03/2021 1211   MCHC 31.8 01/03/2021 1211   RDW 14.0 01/03/2021 1211   LYMPHSABS 1.4 01/03/2021 1211   MONOABS 0.6 01/03/2021 1211   EOSABS 0.1 01/03/2021 1211   BASOSABS 0.0 01/03/2021 1211    Assessment/Plan: Patient was counseled on the purpose, proper use, and adverse effects of hydroxychloroquine including nausea/diarrhea, skin rash, headaches, and sun sensitivity.  Discussed importance of annual eye exams while on hydroxychloroquine to monitor to ocular toxicity and discussed importance of frequent laboratory monitoring.  Provided patient with eye exam form for baseline ophthalmologic exam and standing lab instructions.  Provided patient with educational materials on hydroxychloroquine and answered all questions.  Patient consented to hydroxychloroquine.  Will upload consent in the media tab.  Patient  has f/u visit with cardiologist, Dr. Johney Frame, on 05/06/21 and would like to wait until after her work-up to start hydroxychloroquine. She will get labs re-drawn prior to starting hydroxychloroquine    Dose will be Plaquenil 200 mg twice daily Monday through Friday.

## 2021-04-25 ENCOUNTER — Other Ambulatory Visit: Payer: Self-pay

## 2021-04-25 ENCOUNTER — Ambulatory Visit
Admission: RE | Admit: 2021-04-25 | Discharge: 2021-04-25 | Disposition: A | Discharge status: Home / Self Care | Payer: BC Managed Care – PPO | Source: Ambulatory Visit | Attending: Internal Medicine | Admitting: Internal Medicine

## 2021-04-25 DIAGNOSIS — E210 Primary hyperparathyroidism: Secondary | ICD-10-CM

## 2021-04-25 DIAGNOSIS — Z13820 Encounter for screening for osteoporosis: Secondary | ICD-10-CM

## 2021-04-25 DIAGNOSIS — Z780 Asymptomatic menopausal state: Secondary | ICD-10-CM | POA: Diagnosis not present

## 2021-05-06 ENCOUNTER — Ambulatory Visit: Payer: BC Managed Care – PPO | Admitting: Cardiology

## 2021-05-10 NOTE — Progress Notes (Signed)
Office Visit Note  Patient: Meghan Owen             Date of Birth: 09-22-1956           MRN: [id number]             PCP: Harlan Stains, MD Referring: Harlan Stains, MD Visit Date: 05/24/2021 Occupation: @GUAROCC @  Subjective:  Pain in joints.   History of Present Illness: Meghan Owen is a 66 y.o. female with history of seropositive rheumatoid arthritis.  She states she has some pain and discomfort in her joints which comes and goes.  The pain is mostly in mostly in her hands, knees and her feet.  She has occasional swelling in her fingers and her ankles.  She decided to stay off Plaquenil for now.  She has been also experiencing some redness in her eyes and has an appointment coming up with the ophthalmologist.  Activities of Daily Living:  Patient reports morning stiffness for 0 minutes.   Patient Denies nocturnal pain.  Difficulty dressing/grooming: Denies Difficulty climbing stairs: Denies Difficulty getting out of chair: Denies Difficulty using hands for taps, buttons, cutlery, and/or writing: Denies  Review of Systems  Constitutional:  Positive for fatigue.  HENT:  Positive for mouth sores. Negative for mouth dryness and nose dryness.   Eyes:  Positive for pain. Negative for itching and dryness.  Respiratory:  Negative for shortness of breath and difficulty breathing.   Cardiovascular:  Positive for palpitations. Negative for chest pain.  Gastrointestinal:  Negative for blood in stool, constipation and diarrhea.  Endocrine: Negative for increased urination.  Genitourinary:  Negative for difficulty urinating.  Musculoskeletal:  Positive for joint pain, joint pain, myalgias and myalgias. Negative for joint swelling, morning stiffness and muscle tenderness.  Skin:  Positive for color change and rash.  Allergic/Immunologic: Negative for susceptible to infections.  Neurological:  Positive for dizziness, headaches and parasthesias. Negative for numbness, memory loss and  weakness.  Hematological:  Negative for bruising/bleeding tendency.  Psychiatric/Behavioral:  Negative for confusion.    PMFS History:  Patient Active Problem List   Diagnosis Date Noted   Paresthesia 01/31/2021   Osteoarthritis of both sacroiliac joints ([place]) 01/04/2018   Primary osteoarthritis of both knees 01/04/2018   DDD (degenerative disc disease), cervical 01/04/2018   DDD (degenerative disc disease), lumbar 01/04/2018   Chronic right shoulder pain 01/04/2018   Fibromyalgia 01/04/2018   Hashimoto's thyroiditis 01/04/2018   History of hyperparathyroidism 01/04/2018   History of gastroesophageal reflux (GERD) 01/04/2018   Family history of rheumatoid arthritis 01/04/2018   Cervical myelopathy ([place]) 09/05/2016   Primary osteoarthritis of left hip 01/04/2016   Chronic left hip pain 12/15/2014   Hypercalcemia 09/08/2013   Varus deformity of foot 08/26/2013   Peroneal tendonitis 05/23/2013   Conversion disorder 05/21/2013   Low back pain 11/01/2012   Achilles tendinitis 09/05/2012   Bilateral ankle pain 09/05/2012   Hammer toe, acquired 09/05/2012   Cubital tunnel syndrome on left 08/22/2012   Stress fracture 07/04/2012   Lumbar facet arthropathy 05/10/2012   Diffuse myofascial pain syndrome 01/25/2012   GERD 02/05/2009   OTHER BURSITIS DISORDERS 02/05/2009   ABNORMAL ELECTROCARDIOGRAM 02/05/2009    Past Medical History:  Diagnosis Date   Arthritis    Bursitis, hip    bilateral   DDD (degenerative disc disease), lumbar    Dyslipidemia    Elevated sed rate    Fatty liver    Fibromyalgia    GERD (gastroesophageal reflux disease)  HX   Hashimoto's thyroiditis    Headache    Hypercalcemia    Hyperparathyroidism ([place])    Dr. Elyse Hsu   Hypothyroidism    Liver cyst    BENIGN   Obesity    Osteoarthritis    Parathyroid disorder ([place])    HYPERPARATHYROID   Paresthesia    Peripheral neuropathy    Spinal stenosis    Synovial cyst of elbow    Thyroid disease     Tremor    Vitamin D deficiency     Family History  Problem Relation Age of Onset   Lupus Mother    Rheum arthritis Mother    Dementia Father    Leukemia Father    Cancer Other    Cancer Sister        bone    Healthy Son    Healthy Son    Healthy Daughter    Past Surgical History:  Procedure Laterality Date   ABDOMINAL HYSTERECTOMY     ANKLE SURGERY     ANTERIOR CERVICAL DECOMPRESSION/DISCECTOMY FUSION 4 LEVELS N/A 09/05/2016   Procedure: Cervical three-four Cervical four-five  Cervical five-six Cervical six-seven  Anterior cervical decompression/diskectomy/fusion;  Surgeon: Erline Levine, MD;  Location: [facility];  Service: Neurosurgery;  Laterality: N/A;   CARPAL TUNNEL RELEASE Left 02/26/2018   ELBOW SURGERY  04/2017   cyst removed    FOOT SURGERY     Social History   Social History Narrative   Caffeine yes, widowed.  3 children.     There is no immunization history on file for this patient.   Objective: Vital Signs: BP 136/83 (BP Location: Right Arm, Patient Position: Sitting, Cuff Size: Large)   Pulse 65   Resp 16   Ht $R'5\' 5"'zO$  (1.651 m)   Wt 231 lb 3.2 oz (104.9 kg)   BMI 38.47 kg/m    Physical Exam Vitals and nursing note reviewed.  Constitutional:      Appearance: She is well-developed.  HENT:     Head: Normocephalic and atraumatic.  Eyes:     Conjunctiva/sclera: Conjunctivae normal.  Cardiovascular:     Rate and Rhythm: Normal rate and regular rhythm.     Heart sounds: Normal heart sounds.  Pulmonary:     Effort: Pulmonary effort is normal.     Breath sounds: Normal breath sounds.  Abdominal:     General: Bowel sounds are normal.     Palpations: Abdomen is soft.  Musculoskeletal:     Cervical back: Normal range of motion.  Lymphadenopathy:     Cervical: No cervical adenopathy.  Skin:    General: Skin is warm and dry.     Capillary Refill: Capillary refill takes less than 2 seconds.  Neurological:     Mental Status: She is alert and  oriented to person, place, and time.  Psychiatric:        Behavior: Behavior normal.     Musculoskeletal Exam: C-spine thoracic and lumbar spine with good range of motion.  Shoulder joints, elbow joints, wrist joints, MCPs PIPs and DIPs with good range of motion with no synovitis.  Hip joints, knee joints, ankles, MTPs and PIPs with good range of motion with no synovitis.  CDAI Exam: CDAI Score: 0.2  Patient Global: 1 mm; Provider Global: 1 mm Swollen: 0 ; Tender: 0  Joint Exam 05/24/2021   No joint exam has been documented for this visit   There is currently no information documented on the homunculus. Go to the Rheumatology activity  and complete the homunculus joint exam.  Investigation: No additional findings.  Imaging: DG BONE DENSITY (DXA)  Result Date: 04/25/2021 EXAM: DUAL X-RAY ABSORPTIOMETRY (DXA) FOR BONE MINERAL DENSITY IMPRESSION: Referring Physician:  JEFFREY KERR Your patient completed a bone mineral density test using GE Lunar iDXA system (analysis version: 16). Technologist: [place] PATIENT: Name: Meghan Owen, Meghan Owen Patient ID: [id number] Birth Date: October 02, 1956 Height: 65.0 in. Sex: Female Measured: 04/25/2021 Weight: 231.0 lbs. Indications: Estrogen Deficient, Hyperparathyroid (252.1), Hypothyroid, Postmenopausal Fractures: None Treatments: ASSESSMENT: The BMD measured at Femur Total Left is 0.991 g/cm2 with a T-score of -0.1. This patient is considered normal according to [facility]) criteria. The quality of the exam is good. Site Region Measured Date Measured Age YA BMD Significant CHANGE T-score AP Spine L1-L4 4/25/2021 64.7 0.1 1.196 g/cm2 DualFemur Total Left 04/25/2021 64.7 -0.1 0.991 g/cm2 DualFemur Total Mean 04/25/2021 64.7 0.0 1.010 g/cm2 Left Forearm Radius 33% 04/25/2021 64.7 0.3 0.918 g/cm2 World Health Organization [facility]) criteria for post-menopausal, Caucasian Women: Normal       T-score at or above -1 SD Osteopenia   T-score between -1 and -2.5 SD  Osteoporosis T-score at or below -2.5 SD RECOMMENDATION: 1. All patients should optimize calcium and vitamin D intake. 2. Consider FDA-approved medical therapies in postmenopausal women and men aged 75 years and older, based on the following: a. A hip or vertebral (clinical or morphometric) fracture. b. T-score = -2.5 at the femoral neck or spine after appropriate evaluation to exclude secondary causes. c. Low bone mass (T-score between -1.0 and -2.5 at the femoral neck or spine) and a 10-year probability of a hip fracture = 3% or a 10-year probability of a major osteoporosis-related fracture = 20% based on the [place]-adapted WHO algorithm. d. Clinician judgment and/or patient preferences may indicate treatment for people with 10-year fracture probabilities above or below these levels. FOLLOW-UP: Patients with diagnosis of osteoporosis or at high risk for fracture should have regular bone mineral density tests.? Patients eligible for Medicare are allowed routine testing every 2 years.? The testing frequency can be increased to one year for patients who have rapidly progressing disease, are receiving or discontinuing medical therapy to restore bone mass, or have additional risk factors. I have reviewed this study and agree with the findings. [facility] Electronically Signed   By: Rolm Baptise M.D.   On: 04/25/2021 16:40     Recent Labs: Lab Results  Component Value Date   WBC 6.6 01/03/2021   HGB 12.7 01/03/2021   PLT 197 01/03/2021   NA 137 01/03/2021   K 4.1 01/03/2021   CL 105 01/03/2021   CO2 26 01/03/2021   GLUCOSE 96 01/03/2021   BUN 9 01/03/2021   CREATININE 0.77 01/03/2021   BILITOT 0.6 01/03/2021   ALKPHOS 124 01/03/2021   AST 23 01/03/2021   ALT 17 01/03/2021   PROT 8.2 (H) 01/03/2021   ALBUMIN 3.8 01/03/2021   CALCIUM 10.2 01/03/2021   GFRAA 102 08/20/2019    Speciality Comments: No specialty comments available.  Procedures:  No procedures performed Allergies:  Aspirin, Other, Contrast media [iodinated diagnostic agents], Iodine-131, Ioxaglate, Penicillins, and Prednisone   Assessment / Plan:     Visit Diagnoses: Rheumatoid arthritis of multiple sites with negative rheumatoid factor (HCC) - RF-, Anti-CCP-, Anti-CarP+, family history of rheumatoid arthritis: Patient had no synovitis on examination.  She gives history of intermittent swelling in her hands and her ankles.  She has not had any symptoms since the last  visit.  She appears to have symptoms consistent with palindromic rheumatism.  I do not see a need for immunosuppressive therapy at this point.  I advised her to contact us in case she develops new symptoms.  High risk medication use -( we discussed starting Plaquenil 200 mg 1 tablet by mouth twice daily Monday through Friday at the last visit-which is on hold at this point )  Pain in both hands-she gives history of episodic joint swelling.  No synovitis was noted.  Primary osteoarthritis of both knees - Left knee-mild OA with severe chrondomalacia patella.  Right knee- no OA, chondromalacia patella noted.  She continues to have knee joint discomfort.  Pain in both feet-she gives history of intermittent swelling in the ankles and her feet.  No synovitis was noted.  Fibromyalgia-she has history of generalized pain and hyperalgesia.  DDD (degenerative disc disease), cervical - S/p fusion-Dr. Stern 2017: EMG on 02/21/21: evaluation of the left upper extremity was unremarkable.  Osteoarthritis of both sacroiliac joints (HCC) - HLA-B27 negative, MRI negative for sacroiliitis. MRI on 01/01/2018 revealed chronic fusion of superior aspect of right SI joint.  DDD (degenerative disc disease), lumbar - X-rays of the lumbar spine were consistent with facet joint arthropathy and mild dextroscoliosis. MRI of lumbar spine performed on 12/20/20.  Dry eyes-over-the-counter products were discussed.  Dry mouth  Family history of rheumatoid arthritis -  mother  Elevated sed rate - History of elevated sed rate. ESR was 35 on 01/03/21.   Other fatigue  History of hyperparathyroidism - She is followed by Dr. Buddy Duty.  History of gastroesophageal reflux (GERD)  Hashimoto's thyroiditis - Followed by Dr. Buddy Duty.  Orders: No orders of the defined types were placed in this encounter.  No orders of the defined types were placed in this encounter.   Follow-Up Instructions: Return in about 5 months (around 10/24/2021) for Rheumatoid arthritis.   Bo Merino, MD  Note - This record has been created using Editor, commissioning.  Chart creation errors have been sought, but may not always  have been located. Such creation errors do not reflect on  the standard of medical care.

## 2021-05-24 ENCOUNTER — Other Ambulatory Visit: Payer: Self-pay

## 2021-05-24 ENCOUNTER — Encounter: Payer: Self-pay | Admitting: Rheumatology

## 2021-05-24 ENCOUNTER — Ambulatory Visit: Payer: BC Managed Care – PPO | Admitting: Rheumatology

## 2021-05-24 VITALS — BP 136/83 | HR 65 | Resp 16 | Ht 65.0 in | Wt 231.2 lb

## 2021-05-24 DIAGNOSIS — M461 Sacroiliitis, not elsewhere classified: Secondary | ICD-10-CM

## 2021-05-24 DIAGNOSIS — M5136 Other intervertebral disc degeneration, lumbar region: Secondary | ICD-10-CM

## 2021-05-24 DIAGNOSIS — M51369 Other intervertebral disc degeneration, lumbar region without mention of lumbar back pain or lower extremity pain: Secondary | ICD-10-CM

## 2021-05-24 DIAGNOSIS — R682 Dry mouth, unspecified: Secondary | ICD-10-CM

## 2021-05-24 DIAGNOSIS — R5383 Other fatigue: Secondary | ICD-10-CM

## 2021-05-24 DIAGNOSIS — M79641 Pain in right hand: Secondary | ICD-10-CM

## 2021-05-24 DIAGNOSIS — Z8719 Personal history of other diseases of the digestive system: Secondary | ICD-10-CM

## 2021-05-24 DIAGNOSIS — H04123 Dry eye syndrome of bilateral lacrimal glands: Secondary | ICD-10-CM

## 2021-05-24 DIAGNOSIS — M79672 Pain in left foot: Secondary | ICD-10-CM

## 2021-05-24 DIAGNOSIS — M79642 Pain in left hand: Secondary | ICD-10-CM

## 2021-05-24 DIAGNOSIS — Z79899 Other long term (current) drug therapy: Secondary | ICD-10-CM | POA: Diagnosis not present

## 2021-05-24 DIAGNOSIS — M170 Bilateral primary osteoarthritis of knee: Secondary | ICD-10-CM

## 2021-05-24 DIAGNOSIS — E063 Autoimmune thyroiditis: Secondary | ICD-10-CM

## 2021-05-24 DIAGNOSIS — Z8639 Personal history of other endocrine, nutritional and metabolic disease: Secondary | ICD-10-CM

## 2021-05-24 DIAGNOSIS — M0609 Rheumatoid arthritis without rheumatoid factor, multiple sites: Secondary | ICD-10-CM | POA: Diagnosis not present

## 2021-05-24 DIAGNOSIS — R700 Elevated erythrocyte sedimentation rate: Secondary | ICD-10-CM

## 2021-05-24 DIAGNOSIS — M79671 Pain in right foot: Secondary | ICD-10-CM

## 2021-05-24 DIAGNOSIS — M5030 Other cervical disc degeneration, unspecified cervical region: Secondary | ICD-10-CM

## 2021-05-24 DIAGNOSIS — Z8261 Family history of arthritis: Secondary | ICD-10-CM

## 2021-05-24 DIAGNOSIS — M797 Fibromyalgia: Secondary | ICD-10-CM

## 2021-05-25 ENCOUNTER — Inpatient Hospital Stay (HOSPITAL_COMMUNITY): Payer: BC Managed Care – PPO | Attending: Hematology

## 2021-05-25 DIAGNOSIS — R700 Elevated erythrocyte sedimentation rate: Secondary | ICD-10-CM

## 2021-05-25 DIAGNOSIS — M069 Rheumatoid arthritis, unspecified: Secondary | ICD-10-CM | POA: Diagnosis not present

## 2021-05-25 DIAGNOSIS — R7982 Elevated C-reactive protein (CRP): Secondary | ICD-10-CM

## 2021-05-25 LAB — CBC WITH DIFFERENTIAL/PLATELET
Abs Immature Granulocytes: 0.02 10*3/uL (ref 0.00–0.07)
Basophils Absolute: 0 10*3/uL (ref 0.0–0.1)
Basophils Relative: 1 %
Eosinophils Absolute: 0.1 10*3/uL (ref 0.0–0.5)
Eosinophils Relative: 2 %
HCT: 41.4 % (ref 36.0–46.0)
Hemoglobin: 13.2 g/dL (ref 12.0–15.0)
Immature Granulocytes: 0 %
Lymphocytes Relative: 23 %
Lymphs Abs: 1.5 10*3/uL (ref 0.7–4.0)
MCH: 29.5 pg (ref 26.0–34.0)
MCHC: 31.9 g/dL (ref 30.0–36.0)
MCV: 92.4 fL (ref 80.0–100.0)
Monocytes Absolute: 0.6 10*3/uL (ref 0.1–1.0)
Monocytes Relative: 9 %
Neutro Abs: 4.3 10*3/uL (ref 1.7–7.7)
Neutrophils Relative %: 65 %
Platelets: 211 10*3/uL (ref 150–400)
RBC: 4.48 MIL/uL (ref 3.87–5.11)
RDW: 13.4 % (ref 11.5–15.5)
WBC: 6.6 10*3/uL (ref 4.0–10.5)
nRBC: 0 % (ref 0.0–0.2)

## 2021-05-25 LAB — COMPREHENSIVE METABOLIC PANEL
ALT: 18 U/L (ref 0–44)
AST: 22 U/L (ref 15–41)
Albumin: 4 g/dL (ref 3.5–5.0)
Alkaline Phosphatase: 112 U/L (ref 38–126)
Anion gap: 5 (ref 5–15)
BUN: 12 mg/dL (ref 8–23)
CO2: 26 mmol/L (ref 22–32)
Calcium: 10.1 mg/dL (ref 8.9–10.3)
Chloride: 105 mmol/L (ref 98–111)
Creatinine, Ser: 0.91 mg/dL (ref 0.44–1.00)
GFR, Estimated: >60 mL/min (ref >60)
Glucose, Bld: 96 mg/dL (ref 70–99)
Potassium: 4.3 mmol/L (ref 3.5–5.1)
Sodium: 136 mmol/L (ref 135–145)
Total Bilirubin: 0.5 mg/dL (ref 0.3–1.2)
Total Protein: 8.1 g/dL (ref 6.5–8.1)

## 2021-05-25 LAB — SEDIMENTATION RATE: Sed Rate: 47 mm/hr — ABNORMAL HIGH (ref 0–22)

## 2021-05-25 LAB — C-REACTIVE PROTEIN: CRP: 1 mg/dL — ABNORMAL HIGH (ref <1.0)

## 2021-06-01 ENCOUNTER — Inpatient Hospital Stay (HOSPITAL_COMMUNITY): Payer: BC Managed Care – PPO | Admitting: Hematology and Oncology

## 2021-06-01 ENCOUNTER — Other Ambulatory Visit: Payer: Self-pay

## 2021-06-01 ENCOUNTER — Encounter (HOSPITAL_COMMUNITY): Payer: Self-pay | Admitting: Hematology and Oncology

## 2021-06-01 DIAGNOSIS — M069 Rheumatoid arthritis, unspecified: Secondary | ICD-10-CM | POA: Diagnosis not present

## 2021-06-01 DIAGNOSIS — R7982 Elevated C-reactive protein (CRP): Secondary | ICD-10-CM | POA: Diagnosis not present

## 2021-06-01 NOTE — Assessment & Plan Note (Signed)
I have reviewed her records She follows closely with rheumatologist for rheumatoid arthritis She was also found to have thyroiditis I suspect these are the cause of her elevated inflammatory markers and total protein She does not need long-term hematology follow-up or further testing I have addressed all her questions

## 2021-06-01 NOTE — Progress Notes (Signed)
[facility] progress notes  Patient Care Team: Harlan Stains, MD as PCP - General (Family Medicine) Delrae Rend, MD as Consulting Physician (Endocrinology) Bo Merino, MD as Consulting Physician (Rheumatology)  CHIEF COMPLAINTS/PURPOSE OF VISIT:  Elevated total protein and inflammatory markers  HISTORY OF PRESENTING ILLNESS:  Meghan Owen 65 y.o. female was seen in the absence of her hematologist The patient had extensive work-up earlier this year for abnormal calcium level, elevated total protein and inflammatory markers She had extensive work-up and was found to have rheumatoid arthritis and thyroiditis She feels well She has joint pain but it does not bother her too much No recent infection  MEDICAL HISTORY:  Past Medical History:  Diagnosis Date   Arthritis    Bursitis, hip    bilateral   DDD (degenerative disc disease), lumbar    Dyslipidemia    Elevated sed rate    Fatty liver    Fibromyalgia    GERD (gastroesophageal reflux disease)    HX   Hashimoto's thyroiditis    Headache    Hypercalcemia    Hyperparathyroidism (HCC)    Dr. Elyse Hsu   Hypothyroidism    Liver cyst    BENIGN   Obesity    Osteoarthritis    Parathyroid disorder ([place])    HYPERPARATHYROID   Paresthesia    Peripheral neuropathy    Spinal stenosis    Synovial cyst of elbow    Thyroid disease    Tremor    Vitamin D deficiency     SURGICAL HISTORY: Past Surgical History:  Procedure Laterality Date   ABDOMINAL HYSTERECTOMY     ANKLE SURGERY     ANTERIOR CERVICAL DECOMPRESSION/DISCECTOMY FUSION 4 LEVELS N/A 09/05/2016   Procedure: Cervical three-four Cervical four-five  Cervical five-six Cervical six-seven  Anterior cervical decompression/diskectomy/fusion;  Surgeon: Erline Levine, MD;  Location: [facility];  Service: Neurosurgery;  Laterality: N/A;   CARPAL TUNNEL RELEASE Left 02/26/2018   ELBOW SURGERY  04/2017   cyst removed    FOOT SURGERY       SOCIAL HISTORY: Social History   Socioeconomic History   Marital status: Widowed    Spouse name: Not on file   Number of children: Not on file   Years of education: Not on file   Highest education level: Not on file  Occupational History   Not on file  Tobacco Use   Smoking status: Former    Pack years: 0.00    Types: Cigarettes   Smokeless tobacco: Never  Vaping Use   Vaping Use: Never used  Substance and Sexual Activity   Alcohol use: No   Drug use: No   Sexual activity: Not on file  Other Topics Concern   Not on file  Social History Narrative   Caffeine yes, widowed.  3 children.    Social Determinants of Health   Financial Resource Strain: Low Risk    Difficulty of Paying Living Expenses: Not very hard  Food Insecurity: No Food Insecurity   Worried About Charity fundraiser in the Last Year: Never true   Ran Out of Food in the Last Year: Never true  Transportation Needs: No Transportation Needs   Lack of Transportation (Medical): No   Lack of Transportation (Non-Medical): No  Physical Activity: Inactive   Days of Exercise per Week: 0 days   Minutes of Exercise per Session: 0 min  Stress: No Stress Concern Present   Feeling of Stress : Only a little  Social  Connections: Moderately Integrated   Frequency of Communication with Friends and Family: Three times a week   Frequency of Social Gatherings with Friends and Family: Twice a week   Attends Religious Services: 1 to 4 times per year   Active Member of [employer] or Organizations: No   Attends Music therapist: Never   Marital Status: Married  Human resources officer Violence: Not At Risk   Fear of Current or Ex-Partner: No   Emotionally Abused: No   Physically Abused: No   Sexually Abused: No    FAMILY HISTORY: Family History  Problem Relation Age of Onset   Lupus Mother    Rheum arthritis Mother    Dementia Father    Leukemia Father    Cancer Other    Cancer Sister        bone    Healthy  Son    Healthy Son    Healthy Daughter     ALLERGIES:  is allergic to aspirin, other, caffeine, contrast media [iodinated diagnostic agents], iodine-131, ioxaglate, penicillins, and prednisone.  MEDICATIONS:  Current Outpatient Medications  Medication Sig Dispense Refill   Ascorbic Acid (VITAMIN C PO) Take 1 tablet by mouth daily.     b complex vitamins tablet Take 1 tablet by mouth daily.     Cholecalciferol (VITAMIN D3) 5000 UNITS CAPS Take 5,000 Units by mouth daily.      Digestive Enzymes (DIGESTIVE ENZYME PO) Take by mouth.     Probiotic Product (PROBIOTIC PO) Take 1 tablet by mouth daily.     TURMERIC PO Take 1,500 mg by mouth as needed.     UNABLE TO FIND Med Name: Curamin 1 tablet as needed 350mg      vitamin E 1000 UNIT capsule Take by mouth.     No current facility-administered medications for this visit.    REVIEW OF SYSTEMS:   Constitutional: Denies fevers, chills or abnormal night sweats Eyes: Denies blurriness of vision, double vision or watery eyes Ears, nose, mouth, throat, and face: Denies mucositis or sore throat Respiratory: Denies cough, dyspnea or wheezes Cardiovascular: Denies palpitation, chest discomfort or lower extremity swelling Gastrointestinal:  Denies nausea, heartburn or change in bowel habits Skin: Denies abnormal skin rashes Lymphatics: Denies new lymphadenopathy or easy bruising Neurological:Denies numbness, tingling or new weaknesses Behavioral/Psych: Mood is stable, no new changes  All other systems were reviewed with the patient and are negative.  PHYSICAL EXAMINATION: ECOG PERFORMANCE STATUS: 1 - Symptomatic but completely ambulatory  Vitals:   06/01/21 1120  BP: (!) 154/78  Pulse: (!) 56  Resp: 18  Temp: (!) 96.9 F (36.1 C)  SpO2: 100%   Filed Weights   06/01/21 1120  Weight: 233 lb 9.6 oz (106 kg)    GENERAL:alert, no distress and comfortable NEURO: no focal motor/sensory deficits  LABORATORY DATA:  I have reviewed the  data as listed Lab Results  Component Value Date   WBC 6.6 05/25/2021   HGB 13.2 05/25/2021   HCT 41.4 05/25/2021   MCV 92.4 05/25/2021   PLT 211 05/25/2021   Recent Labs    01/03/21 1211 05/25/21 1059  NA 137 136  K 4.1 4.3  CL 105 105  CO2 26 26  GLUCOSE 96 96  BUN 9 12  CREATININE 0.77 0.91  CALCIUM 10.2 10.1  GFRNONAA >60 >60  PROT 8.2* 8.1  ALBUMIN 3.8 4.0  AST 23 22  ALT 17 18  ALKPHOS 124 112  BILITOT 0.6 0.5    RADIOGRAPHIC STUDIES: I  have personally reviewed the radiological images as listed and agreed with the findings in the report. No results found.  ASSESSMENT & PLAN:  Elevated C-reactive protein (CRP) I have reviewed her records She follows closely with rheumatologist for rheumatoid arthritis She was also found to have thyroiditis I suspect these are the cause of her elevated inflammatory markers and total protein She does not need long-term hematology follow-up or further testing I have addressed all her questions  No orders of the defined types were placed in this encounter.   All questions were answered. The patient knows to call the clinic with any problems, questions or concerns. The total time spent in the appointment was 10 minutes encounter with patients including review of chart and various tests results, discussions about plan of care and coordination of care plan   Heath Lark, MD 06/01/2021 12:00 PM

## 2021-06-14 DIAGNOSIS — H52203 Unspecified astigmatism, bilateral: Secondary | ICD-10-CM | POA: Diagnosis not present

## 2021-06-14 DIAGNOSIS — H33303 Unspecified retinal break, bilateral: Secondary | ICD-10-CM | POA: Diagnosis not present

## 2021-07-02 NOTE — Progress Notes (Signed)
Cardiology Office Note:    Date:  07/05/2021   ID:  Meghan Owen, DOB 1956-08-09, MRN [id number]  PCP:  Harlan Stains, MD   [facility] Providers Cardiologist:  None {   Referring MD: Harlan Stains, MD    History of Present Illness:    Meghan Owen is a 65 y.o. female with a hx of rheumatoid arthritis, fibromyalgia, Hashimotos, and HLD who was referred by Dr. Dema Severin for further evaluation of palpitations.  Today, the patient states that she has been having intermittent palpitations over the past several months. Symptoms occur with exertion and resolve with rest. Each episode lasts a couple of seconds and then resolves. Feels like a fluttering in her chest and her heart "skips a beat." No associated shortness of breath, lightheadedness, dizziness or chest pain. No orthopnea, PND, LE edema. She is overall active and feels well with activity with no exertional chest discomfort. Notably is currently being treated for Hashimoto's thyroiditis and RA which she thinks may also be contributing to the frequency of her symptoms.   Past Medical History:  Diagnosis Date   Arthritis    Bursitis, hip    bilateral   DDD (degenerative disc disease), lumbar    Dyslipidemia    Elevated sed rate    Fatty liver    Fibromyalgia    GERD (gastroesophageal reflux disease)    HX   Hashimoto's thyroiditis    Headache    Hypercalcemia    Hyperparathyroidism ([place])    Dr. Elyse Hsu   Hypothyroidism    Liver cyst    BENIGN   Obesity    Osteoarthritis    Parathyroid disorder ([place])    HYPERPARATHYROID   Paresthesia    Peripheral neuropathy    Spinal stenosis    Synovial cyst of elbow    Thyroid disease    Tremor    Vitamin D deficiency     Past Surgical History:  Procedure Laterality Date   ABDOMINAL HYSTERECTOMY     ANKLE SURGERY     ANTERIOR CERVICAL DECOMPRESSION/DISCECTOMY FUSION 4 LEVELS N/A 09/05/2016   Procedure: Cervical three-four Cervical four-five  Cervical five-six  Cervical six-seven  Anterior cervical decompression/diskectomy/fusion;  Surgeon: Erline Levine, MD;  Location: [place] [facility];  Service: Neurosurgery;  Laterality: N/A;   CARPAL TUNNEL RELEASE Left 02/26/2018   ELBOW SURGERY  04/2017   cyst removed    FOOT SURGERY      Current Medications: No outpatient medications have been marked as taking for the 07/05/21 encounter (Office Visit) with Freada Bergeron, MD.     Allergies:   Aspirin, Other, Caffeine, Contrast media [iodinated diagnostic agents], Iodine-131, Ioxaglate, Penicillins, and Prednisone   Social History   Socioeconomic History   Marital status: Widowed    Spouse name: Not on file   Number of children: Not on file   Years of education: Not on file   Highest education level: Not on file  Occupational History   Not on file  Tobacco Use   Smoking status: Former    Types: Cigarettes   Smokeless tobacco: Never  Vaping Use   Vaping Use: Never used  Substance and Sexual Activity   Alcohol use: No   Drug use: No   Sexual activity: Not on file  Other Topics Concern   Not on file  Social History Narrative   Caffeine yes, widowed.  3 children.    Social Determinants of Health   Financial Resource Strain: Low Risk    Difficulty of  Paying Living Expenses: Not very hard  Food Insecurity: No Food Insecurity   Worried About [place] in the Last Year: Never true   Ran Out of Food in the Last Year: Never true  Transportation Needs: No Transportation Needs   Lack of Transportation (Medical): No   Lack of Transportation (Non-Medical): No  Physical Activity: Inactive   Days of Exercise per Week: 0 days   Minutes of Exercise per Session: 0 min  Stress: No Stress Concern Present   Feeling of Stress : Only a little  Social Connections: Moderately Integrated   Frequency of Communication with Friends and Family: Three times a week   Frequency of Social Gatherings with Friends and Family: Twice a week   Attends  Religious Services: 1 to 4 times per year   Active Member of [employer] or Organizations: No   Attends Music therapist: Never   Marital Status: Married     Family History: The patient's family history includes Cancer in her sister and another family member; Dementia in her father; Healthy in her daughter, son, and son; Leukemia in her father; Lupus in her mother; Rheum arthritis in her mother.  ROS:   Please see the history of present illness.    Review of Systems  Constitutional:  Negative for chills and fever.  HENT:  Negative for sore throat.   Respiratory:  Negative for shortness of breath.   Cardiovascular:  Positive for palpitations. Negative for chest pain, orthopnea, claudication, leg swelling and PND.  Gastrointestinal:  Negative for blood in stool and melena.  Genitourinary:  Negative for dysuria.  Musculoskeletal:  Positive for joint pain and myalgias.  Neurological:  Negative for dizziness and loss of consciousness.  Psychiatric/Behavioral:  Negative for substance abuse.     EKGs/Labs/Other Studies Reviewed:    The following studies were reviewed today: No cardiac studies  EKG:  EKG is  ordered today.  The ekg ordered today demonstrates NSR with HR 65  Recent Labs: 05/25/2021: ALT 18; BUN 12; Creatinine, Ser 0.91; Hemoglobin 13.2; Platelets 211; Potassium 4.3; Sodium 136  Recent Lipid Panel No results found for: CHOL, TRIG, HDL, CHOLHDL, VLDL, LDLCALC, LDLDIRECT        Physical Exam:    VS:  BP 128/78   Pulse 65   Ht '5\' 5"'$  (1.651 m)   Wt 230 lb 9.6 oz (104.6 kg)   SpO2 99%   BMI 38.37 kg/m     Wt Readings from Last 3 Encounters:  07/05/21 230 lb 9.6 oz (104.6 kg)  06/01/21 233 lb 9.6 oz (106 kg)  05/24/21 231 lb 3.2 oz (104.9 kg)     GEN:  Well nourished, well developed in no acute distress HEENT: Normal NECK: No JVD; No carotid bruits CARDIAC: RRR, no murmurs, rubs, gallops RESPIRATORY:  Clear to auscultation without rales, wheezing or  rhonchi  ABDOMEN: Soft, non-tender, non-distended MUSCULOSKELETAL:  No edema; No deformity  SKIN: Warm and dry NEUROLOGIC:  Alert and oriented x 3 PSYCHIATRIC:  Normal affect   ASSESSMENT:    1. Palpitations   2. Hashimoto's thyroiditis   3. Rheumatoid arthritis, involving unspecified site, unspecified whether rheumatoid factor present ([place])    PLAN:    In order of problems listed above:  #Palpitations: Patient presents with intermittent palpitations or "flutters in the chest" that occurs with exertion and resolve with rest. Symptoms are occurring about once a week and last a couple of seconds before resolving. No associated lightheadedness, dizziness, chest pain,  or SOB. Notably, has underlying RA and hashimotos, which may be contributing. Will check cardiac monitor. -Check 14 day zio -Maintain good hydration -Minimize caffeine intake -Pending zio findings, can consider medication at that time  #Hashimoto's Thyroiditis: Recently diagnosed.  -Follow-up with Rheum and PCP as scheduled  #RA: -Follow-up with Rheum as scheduled   Medication Adjustments/Labs and Tests Ordered: Current medicines are reviewed at length with the patient today.  Concerns regarding medicines are outlined above.  Orders Placed This Encounter  Procedures   LONG TERM MONITOR (3-14 DAYS)   EKG 12-Lead   No orders of the defined types were placed in this encounter.   Patient Instructions  Medication Instructions:   Your physician recommends that you continue on your current medications as directed. Please refer to the Current Medication list given to you today.  *If you need a refill on your cardiac medications before your next appointment, please call your pharmacy*   Testing/Procedures:  [place] Monitor Instructions  Your physician has requested you wear a ZIO patch monitor for 14 days.  This is a single patch monitor. [employer] supplies one patch monitor per enrollment.  Additional stickers are not available. Please do not apply patch if you will be having a Nuclear Stress Test,  Echocardiogram, Cardiac CT, MRI, or Chest Xray during the period you would be wearing the  monitor. The patch cannot be worn during these tests. You cannot remove and re-apply the  ZIO XT patch monitor.  Your ZIO patch monitor will be mailed 3 day USPS to your address on file. It may take 3-5 days  to receive your monitor after you have been enrolled.  Once you have received your monitor, please review the enclosed instructions. Your monitor  has already been registered assigning a specific monitor serial # to you.  Billing and Patient Assistance Program Information  We have supplied [employer] with any of your insurance information on file for billing purposes. [employer] offers a sliding scale Patient Assistance Program for patients that do not have  insurance, or whose insurance does not completely cover the cost of the ZIO monitor.  You must apply for the Patient Assistance Program to qualify for this discounted rate.  To apply, please call [employer] at [phone], select option 4, select option 2, ask to apply for  Patient Assistance Program. Theodore Demark will ask your household income, and how many people  are in your household. They will quote your out-of-pocket cost based on that information.  [employer] will also be able to set up a 61-month interest-free payment plan if needed.  Applying the monitor   Shave hair from upper left chest.  Hold abrader disc by orange tab. Rub abrader in 40 strokes over the upper left chest as  indicated in your monitor instructions.  Clean area with 4 enclosed alcohol pads. Let dry.  Apply patch as indicated in monitor instructions. Patch will be placed under collarbone on left  side of chest with arrow pointing upward.  Rub patch adhesive wings for 2 minutes. Remove white label marked "1". Remove the white  label marked "2". Rub patch adhesive wings  for 2 additional minutes.  While looking in a mirror, press and release button in center of patch. A small green light will  flash 3-4 times. This will be your only indicator that the monitor has been turned on.  Do not shower for the first 24 hours. You may shower after the first 24 hours.  Press the button if  you feel a symptom. You will hear a small click. Record Date, Time and  Symptom in the Patient Logbook.  When you are ready to remove the patch, follow instructions on the last 2 pages of Patient  Logbook. Stick patch monitor onto the last page of Patient Logbook.  Place Patient Logbook in the blue and white box. Use locking tab on box and tape box closed  securely. The blue and white box has prepaid postage on it. Please place it in the mailbox as  soon as possible. Your physician should have your test results approximately 7 days after the  monitor has been mailed back to [facility].  Call [place] at [phone] if you have questions regarding  your ZIO XT patch monitor. Call them immediately if you see an orange light blinking on your  monitor.  If your monitor falls off in less than 4 days, contact our Monitor department at [phone].  If your monitor becomes loose or falls off after 4 days call [employer] at [phone] for  suggestions on securing your monitor   Follow-Up:  AS NEEDED WITH DR. Johney Frame   Signed, Freada Bergeron, MD  07/05/2021 11:36 AM    [place]

## 2021-07-05 ENCOUNTER — Other Ambulatory Visit: Payer: Self-pay

## 2021-07-05 ENCOUNTER — Ambulatory Visit: Payer: BC Managed Care – PPO

## 2021-07-05 ENCOUNTER — Telehealth: Payer: Self-pay | Admitting: *Deleted

## 2021-07-05 ENCOUNTER — Encounter: Payer: Self-pay | Admitting: Cardiology

## 2021-07-05 ENCOUNTER — Ambulatory Visit: Payer: BC Managed Care – PPO | Admitting: Cardiology

## 2021-07-05 VITALS — BP 128/78 | HR 65 | Ht 65.0 in | Wt 230.6 lb

## 2021-07-05 DIAGNOSIS — E063 Autoimmune thyroiditis: Secondary | ICD-10-CM | POA: Diagnosis not present

## 2021-07-05 DIAGNOSIS — M069 Rheumatoid arthritis, unspecified: Secondary | ICD-10-CM | POA: Diagnosis not present

## 2021-07-05 DIAGNOSIS — R002 Palpitations: Secondary | ICD-10-CM

## 2021-07-05 NOTE — Telephone Encounter (Signed)
-----   Message from Jennefer Bravo sent at 07/05/2021 11:37 AM EDT ----- Regarding: RE: 14 DAY ZIO done ----- Message ----- From: Nuala Alpha, LPN Sent: [id number]  11:28 AM EDT To: Kendall Flack Subject: 14 DAY ZIO                                     This pt needs a 14 day zio for palp per [place]. Please enroll and let me know.  Thanks, [employer]

## 2021-07-05 NOTE — Patient Instructions (Signed)
Medication Instructions:   Your physician recommends that you continue on your current medications as directed. Please refer to the Current Medication list given to you today.  *If you need a refill on your cardiac medications before your next appointment, please call your pharmacy*   Testing/Procedures:  [place] Monitor Instructions  Your physician has requested you wear a ZIO patch monitor for 14 days.  This is a single patch monitor. [employer] supplies one patch monitor per enrollment. Additional stickers are not available. Please do not apply patch if you will be having a Nuclear Stress Test,  Echocardiogram, Cardiac CT, MRI, or Chest Xray during the period you would be wearing the  monitor. The patch cannot be worn during these tests. You cannot remove and re-apply the  ZIO XT patch monitor.  Your ZIO patch monitor will be mailed 3 day USPS to your address on file. It may take 3-5 days  to receive your monitor after you have been enrolled.  Once you have received your monitor, please review the enclosed instructions. Your monitor  has already been registered assigning a specific monitor serial # to you.  Billing and Patient Assistance Program Information  We have supplied [employer] with any of your insurance information on file for billing purposes. [employer] offers a sliding scale Patient Assistance Program for patients that do not have  insurance, or whose insurance does not completely cover the cost of the ZIO monitor.  You must apply for the Patient Assistance Program to qualify for this discounted rate.  To apply, please call [employer] at [phone], select option 4, select option 2, ask to apply for  Patient Assistance Program. Theodore Demark will ask your household income, and how many people  are in your household. They will quote your out-of-pocket cost based on that information.  [employer] will also be able to set up a 52-month interest-free payment plan if  needed.  Applying the monitor   Shave hair from upper left chest.  Hold abrader disc by orange tab. Rub abrader in 40 strokes over the upper left chest as  indicated in your monitor instructions.  Clean area with 4 enclosed alcohol pads. Let dry.  Apply patch as indicated in monitor instructions. Patch will be placed under collarbone on left  side of chest with arrow pointing upward.  Rub patch adhesive wings for 2 minutes. Remove white label marked "1". Remove the white  label marked "2". Rub patch adhesive wings for 2 additional minutes.  While looking in a mirror, press and release button in center of patch. A small green light will  flash 3-4 times. This will be your only indicator that the monitor has been turned on.  Do not shower for the first 24 hours. You may shower after the first 24 hours.  Press the button if you feel a symptom. You will hear a small click. Record Date, Time and  Symptom in the Patient Logbook.  When you are ready to remove the patch, follow instructions on the last 2 pages of Patient  Logbook. Stick patch monitor onto the last page of Patient Logbook.  Place Patient Logbook in the blue and white box. Use locking tab on box and tape box closed  securely. The blue and white box has prepaid postage on it. Please place it in the mailbox as  soon as possible. Your physician should have your test results approximately 7 days after the  monitor [facility]  Call [employer]  Customer Care at [phone] if you have questions regarding  your ZIO XT patch monitor. Call them immediately if you see an orange light blinking on your  monitor.  If your monitor falls off in less than 4 days, contact our Monitor department at [phone].  If your monitor becomes loose or falls off after 4 days call [employer] at [phone] for  suggestions on securing your monitor   Follow-Up:  AS NEEDED WITH DR. Johney Frame

## 2021-07-05 NOTE — Progress Notes (Unsigned)
Patient enrolled for [employer] to mail a 14 day ZIO XT to address on file.

## 2021-08-09 NOTE — Progress Notes (Deleted)
Office Visit Note  Patient: Meghan Owen             Date of Birth: [birth year]           MRN: [id number]             PCP: Harlan Stains, MD Referring: Harlan Stains, MD Visit Date: 08/23/2021 Occupation: '@GUAROCC'$ @  Subjective:  No chief complaint on file.   History of Present Illness: Meghan Owen is a 65 y.o. female ***   Activities of Daily Living:  Patient reports morning stiffness for *** {minute/hour:19697}.   Patient {ACTIONS;DENIES/REPORTS:21021675::"Denies"} nocturnal pain.  Difficulty dressing/grooming: {ACTIONS;DENIES/REPORTS:21021675::"Denies"} Difficulty climbing stairs: {ACTIONS;DENIES/REPORTS:21021675::"Denies"} Difficulty getting out of chair: {ACTIONS;DENIES/REPORTS:21021675::"Denies"} Difficulty using hands for taps, buttons, cutlery, and/or writing: {ACTIONS;DENIES/REPORTS:21021675::"Denies"}  No Rheumatology ROS completed.   PMFS History:  Patient Active Problem List   Diagnosis Date Noted   Elevated C-reactive protein (CRP) 06/01/2021   Paresthesia 01/31/2021   Osteoarthritis of both sacroiliac joints ([place]) 01/04/2018   Primary osteoarthritis of both knees 01/04/2018   DDD (degenerative disc disease), cervical 01/04/2018   DDD (degenerative disc disease), lumbar 01/04/2018   Chronic right shoulder pain 01/04/2018   Fibromyalgia 01/04/2018   Hashimoto's thyroiditis 01/04/2018   History of hyperparathyroidism 01/04/2018   History of gastroesophageal reflux (GERD) 01/04/2018   Family history of rheumatoid arthritis 01/04/2018   Cervical myelopathy ([place]) 09/05/2016   Primary osteoarthritis of left hip 01/04/2016   Chronic left hip pain 12/15/2014   Hypercalcemia 09/08/2013   Varus deformity of foot 08/26/2013   Peroneal tendonitis 05/23/2013   Conversion disorder 05/21/2013   Low back pain 11/01/2012   Achilles tendinitis 09/05/2012   Bilateral ankle pain 09/05/2012   Hammer toe, acquired 09/05/2012   Cubital tunnel syndrome on left  08/22/2012   Stress fracture 07/04/2012   Lumbar facet arthropathy 05/10/2012   Diffuse myofascial pain syndrome 01/25/2012   GERD 02/05/2009   OTHER BURSITIS DISORDERS 02/05/2009   ABNORMAL ELECTROCARDIOGRAM 02/05/2009    Past Medical History:  Diagnosis Date   Arthritis    Bursitis, hip    bilateral   DDD (degenerative disc disease), lumbar    Dyslipidemia    Elevated sed rate    Fatty liver    Fibromyalgia    GERD (gastroesophageal reflux disease)    HX   Hashimoto's thyroiditis    Headache    Hypercalcemia    Hyperparathyroidism ([place])    Dr. Elyse Hsu   Hypothyroidism    Liver cyst    BENIGN   Obesity    Osteoarthritis    Parathyroid disorder ([place])    HYPERPARATHYROID   Paresthesia    Peripheral neuropathy    Spinal stenosis    Synovial cyst of elbow    Thyroid disease    Tremor    Vitamin D deficiency     Family History  Problem Relation Age of Onset   Lupus Mother    Rheum arthritis Mother    Dementia Father    Leukemia Father    Cancer Other    Cancer Sister        bone    Healthy Son    Healthy Son    Healthy Daughter    Past Surgical History:  Procedure Laterality Date   ABDOMINAL HYSTERECTOMY     ANKLE SURGERY     ANTERIOR CERVICAL DECOMPRESSION/DISCECTOMY FUSION 4 LEVELS N/A 09/05/2016   Procedure: Cervical three-four Cervical four-five  Cervical five-six Cervical six-seven  Anterior cervical decompression/diskectomy/fusion;  Surgeon: Erline Levine, MD;  Location: [place] [facility];  Service: Neurosurgery;  Laterality: N/A;   CARPAL TUNNEL RELEASE Left 02/26/2018   ELBOW SURGERY  04/2017   cyst removed    FOOT SURGERY     Social History   Social History Narrative   Caffeine yes, widowed.  3 children.     There is no immunization history on file for this patient.   Objective: Vital Signs: There were no vitals taken for this visit.   Physical Exam   Musculoskeletal Exam: ***  CDAI Exam: CDAI Score: -- Patient Global: --; Provider  Global: -- Swollen: --; Tender: -- Joint Exam 08/23/2021   No joint exam has been documented for this visit   There is currently no information documented on the homunculus. Go to the Rheumatology activity and complete the homunculus joint exam.  Investigation: No additional findings.  Imaging: No results found.  Recent Labs: Lab Results  Component Value Date   WBC 6.6 05/25/2021   HGB 13.2 05/25/2021   PLT 211 05/25/2021   NA 136 05/25/2021   K 4.3 05/25/2021   CL 105 05/25/2021   CO2 26 05/25/2021   GLUCOSE 96 05/25/2021   BUN 12 05/25/2021   CREATININE 0.91 05/25/2021   BILITOT 0.5 05/25/2021   ALKPHOS 112 05/25/2021   AST 22 05/25/2021   ALT 18 05/25/2021   PROT 8.1 05/25/2021   ALBUMIN 4.0 05/25/2021   CALCIUM 10.1 05/25/2021   GFRAA 102 08/20/2019    Speciality Comments: No specialty comments available.  Procedures:  No procedures performed Allergies: Aspirin, Other, Caffeine, Contrast media [iodinated diagnostic agents], Iodine-131, Ioxaglate, Penicillins, and Prednisone   Assessment / Plan:     Visit Diagnoses: No diagnosis found.  Orders: No orders of the defined types were placed in this encounter.  No orders of the defined types were placed in this encounter.   Face-to-face time spent with patient was *** minutes. Greater than 50% of time was spent in counseling and coordination of care.  Follow-Up Instructions: No follow-ups on file.   Earnestine Mealing, CMA  Note - This record has been created using Editor, commissioning.  Chart creation errors have been sought, but may not always  have been located. Such creation errors do not reflect on  the standard of medical care.

## 2021-08-10 ENCOUNTER — Other Ambulatory Visit: Payer: BC Managed Care – PPO

## 2021-08-23 ENCOUNTER — Ambulatory Visit: Payer: BC Managed Care – PPO | Admitting: Physician Assistant

## 2021-08-23 DIAGNOSIS — Z8719 Personal history of other diseases of the digestive system: Secondary | ICD-10-CM

## 2021-08-23 DIAGNOSIS — R5383 Other fatigue: Secondary | ICD-10-CM

## 2021-08-23 DIAGNOSIS — M5030 Other cervical disc degeneration, unspecified cervical region: Secondary | ICD-10-CM

## 2021-08-23 DIAGNOSIS — Z8639 Personal history of other endocrine, nutritional and metabolic disease: Secondary | ICD-10-CM

## 2021-08-23 DIAGNOSIS — M797 Fibromyalgia: Secondary | ICD-10-CM

## 2021-08-23 DIAGNOSIS — M79671 Pain in right foot: Secondary | ICD-10-CM

## 2021-08-23 DIAGNOSIS — Z8261 Family history of arthritis: Secondary | ICD-10-CM

## 2021-08-23 DIAGNOSIS — E063 Autoimmune thyroiditis: Secondary | ICD-10-CM

## 2021-08-23 DIAGNOSIS — Z79899 Other long term (current) drug therapy: Secondary | ICD-10-CM

## 2021-08-23 DIAGNOSIS — R682 Dry mouth, unspecified: Secondary | ICD-10-CM

## 2021-08-23 DIAGNOSIS — M170 Bilateral primary osteoarthritis of knee: Secondary | ICD-10-CM

## 2021-08-23 DIAGNOSIS — M0609 Rheumatoid arthritis without rheumatoid factor, multiple sites: Secondary | ICD-10-CM

## 2021-08-23 DIAGNOSIS — R700 Elevated erythrocyte sedimentation rate: Secondary | ICD-10-CM

## 2021-08-23 DIAGNOSIS — M5136 Other intervertebral disc degeneration, lumbar region: Secondary | ICD-10-CM

## 2021-08-23 DIAGNOSIS — M79641 Pain in right hand: Secondary | ICD-10-CM

## 2021-08-23 DIAGNOSIS — M461 Sacroiliitis, not elsewhere classified: Secondary | ICD-10-CM

## 2021-08-23 DIAGNOSIS — H04123 Dry eye syndrome of bilateral lacrimal glands: Secondary | ICD-10-CM

## 2021-09-06 NOTE — Progress Notes (Deleted)
Office Visit Note  Patient: Meghan Owen             Date of Birth: 10-03-1956           MRN: [id number]             PCP: Harlan Stains, MD Referring: Harlan Stains, MD Visit Date: 09/20/2021 Occupation: @GUAROCC @  Subjective:  No chief complaint on file.   History of Present Illness: Meghan Owen is a 65 y.o. female ***   Activities of Daily Living:  Patient reports morning stiffness for *** {minute/hour:19697}.   Patient {ACTIONS;DENIES/REPORTS:21021675::"Denies"} nocturnal pain.  Difficulty dressing/grooming: {ACTIONS;DENIES/REPORTS:21021675::"Denies"} Difficulty climbing stairs: {ACTIONS;DENIES/REPORTS:21021675::"Denies"} Difficulty getting out of chair: {ACTIONS;DENIES/REPORTS:21021675::"Denies"} Difficulty using hands for taps, buttons, cutlery, and/or writing: {ACTIONS;DENIES/REPORTS:21021675::"Denies"}  No Rheumatology ROS completed.   PMFS History:  Patient Active Problem List   Diagnosis Date Noted  . Elevated C-reactive protein (CRP) 06/01/2021  . Paresthesia 01/31/2021  . Osteoarthritis of both sacroiliac joints ([place]) 01/04/2018  . Primary osteoarthritis of both knees 01/04/2018  . DDD (degenerative disc disease), cervical 01/04/2018  . DDD (degenerative disc disease), lumbar 01/04/2018  . Chronic right shoulder pain 01/04/2018  . Fibromyalgia 01/04/2018  . Hashimoto's thyroiditis 01/04/2018  . History of hyperparathyroidism 01/04/2018  . History of gastroesophageal reflux (GERD) 01/04/2018  . Family history of rheumatoid arthritis 01/04/2018  . Cervical myelopathy ([place]) 09/05/2016  . Primary osteoarthritis of left hip 01/04/2016  . Chronic left hip pain 12/15/2014  . Hypercalcemia 09/08/2013  . Varus deformity of foot 08/26/2013  . Peroneal tendonitis 05/23/2013  . Conversion disorder 05/21/2013  . Low back pain 11/01/2012  . Achilles tendinitis 09/05/2012  . Bilateral ankle pain 09/05/2012  . Hammer toe, acquired 09/05/2012  . Cubital tunnel  syndrome on left 08/22/2012  . Stress fracture 07/04/2012  . Lumbar facet arthropathy 05/10/2012  . Diffuse myofascial pain syndrome 01/25/2012  . GERD 02/05/2009  . OTHER BURSITIS DISORDERS 02/05/2009  . ABNORMAL ELECTROCARDIOGRAM 02/05/2009    Past Medical History:  Diagnosis Date  . Arthritis   . Bursitis, hip    bilateral  . DDD (degenerative disc disease), lumbar   . Dyslipidemia   . Elevated sed rate   . Fatty liver   . Fibromyalgia   . GERD (gastroesophageal reflux disease)    HX  . Hashimoto's thyroiditis   . Headache   . Hypercalcemia   . Hyperparathyroidism ([place])    Dr. Elyse Hsu  . Hypothyroidism   . Liver cyst    BENIGN  . Obesity   . Osteoarthritis   . Parathyroid disorder ([place])    HYPERPARATHYROID  . Paresthesia   . Peripheral neuropathy   . Spinal stenosis   . Synovial cyst of elbow   . Thyroid disease   . Tremor   . Vitamin D deficiency     Family History  Problem Relation Age of Onset  . Lupus Mother   . Rheum arthritis Mother   . Dementia Father   . Leukemia Father   . Cancer Other   . Cancer Sister        bone   . Healthy Son   . Healthy Son   . Healthy Daughter    Past Surgical History:  Procedure Laterality Date  . ABDOMINAL HYSTERECTOMY    . ANKLE SURGERY    . ANTERIOR CERVICAL DECOMPRESSION/DISCECTOMY FUSION 4 LEVELS N/A 09/05/2016   Procedure: Cervical three-four Cervical four-five  Cervical five-six Cervical six-seven  Anterior cervical decompression/diskectomy/fusion;  Surgeon: Erline Levine, MD;  Location: [facility];  Service: Neurosurgery;  Laterality: N/A;  . CARPAL TUNNEL RELEASE Left 02/26/2018  . ELBOW SURGERY  04/2017   cyst removed   . FOOT SURGERY     Social History   Social History Narrative   Caffeine yes, widowed.  3 children.     There is no immunization history on file for this patient.   Objective: Vital Signs: There were no vitals taken for this visit.   Physical Exam   Musculoskeletal Exam:  ***  CDAI Exam: CDAI Score: -- Patient Global: --; Provider Global: -- Swollen: --; Tender: -- Joint Exam 09/20/2021   No joint exam has been documented for this visit   There is currently no information documented on the homunculus. Go to the Rheumatology activity and complete the homunculus joint exam.  Investigation: No additional findings.  Imaging: No results found.  Recent Labs: Lab Results  Component Value Date   WBC 6.6 05/25/2021   HGB 13.2 05/25/2021   PLT 211 05/25/2021   NA 136 05/25/2021   K 4.3 05/25/2021   CL 105 05/25/2021   CO2 26 05/25/2021   GLUCOSE 96 05/25/2021   BUN 12 05/25/2021   CREATININE 0.91 05/25/2021   BILITOT 0.5 05/25/2021   ALKPHOS 112 05/25/2021   AST 22 05/25/2021   ALT 18 05/25/2021   PROT 8.1 05/25/2021   ALBUMIN 4.0 05/25/2021   CALCIUM 10.1 05/25/2021   GFRAA 102 08/20/2019    Speciality Comments: No specialty comments available.  Procedures:  No procedures performed Allergies: Aspirin, Other, Caffeine, Contrast media [iodinated diagnostic agents], Iodine-131, Ioxaglate, Penicillins, and Prednisone   Assessment / Plan:     Visit Diagnoses: No diagnosis found.  Orders: No orders of the defined types were placed in this encounter.  No orders of the defined types were placed in this encounter.   Face-to-face time spent with patient was *** minutes. Greater than 50% of time was spent in counseling and coordination of care.  Follow-Up Instructions: No follow-ups on file.   Earnestine Mealing, CMA  Note - This record has been created using Editor, commissioning.  Chart creation errors have been sought, but may not always  have been located. Such creation errors do not reflect on  the standard of medical care.

## 2021-09-19 NOTE — Progress Notes (Signed)
Office Visit Note  Patient: Meghan Owen             Date of Birth: 10-01-1956           MRN: [id number]             PCP: Harlan Stains, MD Referring: Harlan Stains, MD Visit Date: 10/03/2021 Occupation: @GUAROCC @  Subjective:  Lower back pain   History of Present Illness: Meghan Owen is a 65 y.o. female with history of seronegative rheumatoid arthritis, osteoarthritis, DDD, and fibromyalgia.  Patient presents today for routine follow-up.  She states that she has not had any signs or symptoms of a rheumatoid arthritis flare recently.  She has not had any morning stiffness or nocturnal pain.  She denies any joint swelling.  She has had some increased discomfort in her left ankle which she has sprained several times in the past.  She has an upcoming appointment with her podiatrist on 10/18/2021 for further evaluation.  She has not needed to take any Tylenol or NSAIDs for pain relief.  She remains on turmeric daily.  She denies any neck pain.  She continues to have chronic lower back pain.  She has no symptoms of radiculopathy at this time.  She experiences intermittent myalgias and muscle tenderness due to fibromyalgia.   Activities of Daily Living:  Patient reports morning stiffness for 0 minutes.   Patient Denies nocturnal pain.  Difficulty dressing/grooming: Denies Difficulty climbing stairs: Denies Difficulty getting out of chair: Denies Difficulty using hands for taps, buttons, cutlery, and/or writing: Denies  Review of Systems  Constitutional:  Positive for fatigue.  HENT:  Negative for mouth sores, mouth dryness and nose dryness.   Eyes:  Positive for dryness. Negative for itching.  Respiratory:  Negative for shortness of breath and difficulty breathing.   Cardiovascular:  Negative for chest pain and palpitations.  Gastrointestinal:  Positive for constipation. Negative for blood in stool and diarrhea.  Endocrine: Positive for increased urination.  Genitourinary:  Negative  for difficulty urinating and painful urination.  Musculoskeletal:  Positive for joint pain, joint pain, myalgias, muscle tenderness and myalgias. Negative for joint swelling and morning stiffness.  Skin:  Positive for color change. Negative for rash and redness.  Allergic/Immunologic: Negative for susceptible to infections.  Neurological:  Positive for dizziness, numbness and headaches. Negative for memory loss.  Hematological:  Positive for bruising/bleeding tendency.  Psychiatric/Behavioral:  Negative for confusion.    PMFS History:  Patient Active Problem List   Diagnosis Date Noted   Elevated C-reactive protein (CRP) 06/01/2021   Paresthesia 01/31/2021   Osteoarthritis of both sacroiliac joints ([place]) 01/04/2018   Primary osteoarthritis of both knees 01/04/2018   DDD (degenerative disc disease), cervical 01/04/2018   DDD (degenerative disc disease), lumbar 01/04/2018   Chronic right shoulder pain 01/04/2018   Fibromyalgia 01/04/2018   Hashimoto's thyroiditis 01/04/2018   History of hyperparathyroidism 01/04/2018   History of gastroesophageal reflux (GERD) 01/04/2018   Family history of rheumatoid arthritis 01/04/2018   Cervical myelopathy ([place]) 09/05/2016   Primary osteoarthritis of left hip 01/04/2016   Chronic left hip pain 12/15/2014   Hypercalcemia 09/08/2013   Varus deformity of foot 08/26/2013   Peroneal tendonitis 05/23/2013   Conversion disorder 05/21/2013   Low back pain 11/01/2012   Achilles tendinitis 09/05/2012   Bilateral ankle pain 09/05/2012   Hammer toe, acquired 09/05/2012   Cubital tunnel syndrome on left 08/22/2012   Stress fracture 07/04/2012   Lumbar facet arthropathy 05/10/2012  Diffuse myofascial pain syndrome 01/25/2012   GERD 02/05/2009   OTHER BURSITIS DISORDERS 02/05/2009   ABNORMAL ELECTROCARDIOGRAM 02/05/2009    Past Medical History:  Diagnosis Date   Arthritis    Bursitis, hip    bilateral   DDD (degenerative disc disease), lumbar     Dyslipidemia    Elevated sed rate    Fatty liver    Fibromyalgia    GERD (gastroesophageal reflux disease)    HX   Hashimoto's thyroiditis    Headache    Hypercalcemia    Hyperparathyroidism ([place])    Dr. Elyse Hsu   Hypothyroidism    Liver cyst    BENIGN   Obesity    Osteoarthritis    Parathyroid disorder ([place])    HYPERPARATHYROID   Paresthesia    Peripheral neuropathy    Spinal stenosis    Synovial cyst of elbow    Thyroid disease    Tremor    Vitamin D deficiency     Family History  Problem Relation Age of Onset   Lupus Mother    Rheum arthritis Mother    Dementia Father    Leukemia Father    Cancer Other    Cancer Sister        bone    Healthy Son    Healthy Son    Healthy Daughter    Past Surgical History:  Procedure Laterality Date   ABDOMINAL HYSTERECTOMY     ANKLE SURGERY     ANTERIOR CERVICAL DECOMPRESSION/DISCECTOMY FUSION 4 LEVELS N/A 09/05/2016   Procedure: Cervical three-four Cervical four-five  Cervical five-six Cervical six-seven  Anterior cervical decompression/diskectomy/fusion;  Surgeon: Erline Levine, MD;  Location: [facility];  Service: Neurosurgery;  Laterality: N/A;   CARPAL TUNNEL RELEASE Left 02/26/2018   ELBOW SURGERY  04/2017   cyst removed    FOOT SURGERY     Social History   Social History Narrative   Caffeine yes, widowed.  3 children.     There is no immunization history on file for this patient.   Objective: Vital Signs: BP (!) 143/81 (BP Location: Left Arm, Patient Position: Sitting, Cuff Size: Large)   Pulse 65   Ht $R'5\' 5"'TU$  (1.651 m)   Wt 218 lb 3.2 oz (99 kg)   BMI 36.31 kg/m    Physical Exam Vitals and nursing note reviewed.  Constitutional:      Appearance: She is well-developed.  HENT:     Head: Normocephalic and atraumatic.  Eyes:     Conjunctiva/sclera: Conjunctivae normal.  Pulmonary:     Effort: Pulmonary effort is normal.  Abdominal:     Palpations: Abdomen is soft.  Musculoskeletal:     Cervical  back: Normal range of motion.  Skin:    General: Skin is warm and dry.     Capillary Refill: Capillary refill takes less than 2 seconds.  Neurological:     Mental Status: She is alert and oriented to person, place, and time.  Psychiatric:        Behavior: Behavior normal.     Musculoskeletal Exam: C-spine has slightly limited range of motion with lateral rotation.  Trapezius muscle tension and tenderness bilaterally.  Some midline spinal tenderness in the lumbar region and over the left SI joint shoulder.  joints, elbow joints, wrist joints, MCPs, PIPs, DIPs have good range of motion with no synovitis.  Complete fist formation bilaterally.  Hip joints have good range of motion with no discomfort.  Knee joints have good ROM with no  tenderness or joint swelling.  Ankle joints have good range of motion with some tenderness in the left ankle.  No tenderness over MTP joints.  CDAI Exam: CDAI Score: -- Patient Global: --; Provider Global: -- Swollen: --; Tender: -- Joint Exam 10/03/2021   No joint exam has been documented for this visit   There is currently no information documented on the homunculus. Go to the Rheumatology activity and complete the homunculus joint exam.  Investigation: No additional findings.  Imaging: No results found.  Recent Labs: Lab Results  Component Value Date   WBC 6.6 05/25/2021   HGB 13.2 05/25/2021   PLT 211 05/25/2021   NA 136 05/25/2021   K 4.3 05/25/2021   CL 105 05/25/2021   CO2 26 05/25/2021   GLUCOSE 96 05/25/2021   BUN 12 05/25/2021   CREATININE 0.91 05/25/2021   BILITOT 0.5 05/25/2021   ALKPHOS 112 05/25/2021   AST 22 05/25/2021   ALT 18 05/25/2021   PROT 8.1 05/25/2021   ALBUMIN 4.0 05/25/2021   CALCIUM 10.1 05/25/2021   GFRAA 102 08/20/2019    Speciality Comments: No specialty comments available.  Procedures:  No procedures performed Allergies: Aspirin, Other, Caffeine, Contrast media [iodinated diagnostic agents], Iodine-131,  Ioxaglate, Penicillins, and Prednisone   Assessment / Plan:     Visit Diagnoses: Rheumatoid arthritis of multiple sites with negative rheumatoid factor (HCC) - RF-, Anti-CCP-, Anti-CarP+, family history of rheumatoid arthritis: She has no joint tenderness or synovitis on examination today.  She has not had any signs or symptoms of a rheumatoid arthritis flare.  She has not been experiencing any morning stiffness or nocturnal pain.  She has not noticed any joint swelling recently.  She is not currently taking any immunosuppressive agents and does not want to add any medications at this time.  She plans on continuing on turmeric on a daily basis.  She would like to have sed rate and CRP rechecked today.  She was advised to notify us if she develops increased joint pain or joint swelling.  She will follow-up in the office in 6 months. - Plan: Sedimentation rate, C-reactive protein  High risk medication use -Plaquenil was discussed in the past but we decided to hold off at that time.  She does not require any immunosuppression at this time.  She requested a CBC and CMP drawn today.- Plan: COMPLETE METABOLIC PANEL WITH GFR, CBC with Differential/Platelet  Pain in both hands: She has no clinical features of rheumatoid arthritis.  No tenderness or synovitis was noted on examination today.  Complete fist formation bilaterally.  Primary osteoarthritis of both knees - Left knee-mild OA with severe chrondomalacia patella.  Right knee- no OA, chondromalacia patella noted.  She has good range of motion of both knee joints with no warmth or effusion.  She has been working on weight loss.  We discussed the importance of lower extremity muscle strengthening.  She has been walking on a daily basis for exercise.  Pain in both feet: She has been experiencing intermittent discomfort in the left ankle joint.  She has previously injured her left ankle several times in the past. No recent fall or injury. She has no warmth or  synovitis on examination today. She has an upcoming appointment with podiatry on 10/18/21 for further evaluation.   Fibromyalgia: She experiences intermittent myalgias and muscle tenderness due to fibromyalgia.  She has some trapezius muscle tension and tenderness bilaterally.  She continues to have chronic lower back pain.  She has  not been taking any over-the-counter products for pain relief.  Discussed the importance of regular exercise and good sleep hygiene.  She has been walking daily as well as performing weightlifting exercises on a regular basis.  DDD (degenerative disc disease), cervical - S/p fusion-Dr. Stern 2017: EMG on 02/21/21: She has slight limited range of motion with lateral rotation.  Some trapezius muscle tension and tenderness bilaterally.  Osteoarthritis of both sacroiliac joints (HCC) - HLA-B27 negative, MRI negative for sacroiliitis. MRI on 01/01/2018 revealed chronic fusion of superior aspect of right SI joint.  She has chronic pain in her lower back especially over the left SI joint.  Discussed the importance of regular exercise and weight loss.  DDD (degenerative disc disease), lumbar: Chronic pain.  No symptoms of radiculopathy at this time.   Dry eyes: Followed by ophthalmology.   Dry mouth: Improved.   Family history of rheumatoid arthritis - mother  Other fatigue: Stable.  Discussed the importance of regular exercise and good sleep hygiene.   Elevated sed rate - History of elevated sed rate. ESR was 35 on 01/03/21. ESR was 47 on 05/25/21.  ESR, CRP, and SPEP will be updated today.  - Plan: Sedimentation rate, C3 and C4, C-reactive protein, Serum protein electrophoresis with reflex  Hashimoto's thyroiditis - Followed by Dr. Buddy Duty.  Positive thyroid peroxidase and thyroglobulin ab.    Abnormal laboratory test - Positive Anti-CarP, Anti-thyroid peroxidase Positive, anti-thyroglobulin positive, and +RNA Pol III.  She was given orders to update AVISE.  CRP and ESR were  updated today.  She was advised to notify us if she develops any new or worsening symptoms.     Plan: Sedimentation rate, C3 and C4, C-reactive protein  Other medical conditions are listed as follows:   History of gastroesophageal reflux (GERD)  History of hyperparathyroidism - She is followed by Dr. Buddy Duty.    Orders: Orders Placed This Encounter  Procedures   COMPLETE METABOLIC PANEL WITH GFR   CBC with Differential/Platelet   Sedimentation rate   C3 and C4   C-reactive protein   Serum protein electrophoresis with reflex    No orders of the defined types were placed in this encounter.    Follow-Up Instructions: Return in about 6 months (around 04/03/2022) for Rheumatoid arthritis, Osteoarthritis, DDD, Fibromyalgia.   Ofilia Neas, PA-C  Note - This record has been created using Dragon software.  Chart creation errors have been sought, but may not always  have been located. Such creation errors do not reflect on  the standard of medical care.

## 2021-09-20 ENCOUNTER — Ambulatory Visit: Payer: BC Managed Care – PPO | Admitting: Physician Assistant

## 2021-09-20 DIAGNOSIS — Z79899 Other long term (current) drug therapy: Secondary | ICD-10-CM

## 2021-09-20 DIAGNOSIS — M5136 Other intervertebral disc degeneration, lumbar region: Secondary | ICD-10-CM

## 2021-09-20 DIAGNOSIS — M461 Sacroiliitis, not elsewhere classified: Secondary | ICD-10-CM

## 2021-09-20 DIAGNOSIS — H04123 Dry eye syndrome of bilateral lacrimal glands: Secondary | ICD-10-CM

## 2021-09-20 DIAGNOSIS — M5030 Other cervical disc degeneration, unspecified cervical region: Secondary | ICD-10-CM

## 2021-09-20 DIAGNOSIS — Z8639 Personal history of other endocrine, nutritional and metabolic disease: Secondary | ICD-10-CM

## 2021-09-20 DIAGNOSIS — E063 Autoimmune thyroiditis: Secondary | ICD-10-CM

## 2021-09-20 DIAGNOSIS — R682 Dry mouth, unspecified: Secondary | ICD-10-CM

## 2021-09-20 DIAGNOSIS — M79641 Pain in right hand: Secondary | ICD-10-CM

## 2021-09-20 DIAGNOSIS — Z8719 Personal history of other diseases of the digestive system: Secondary | ICD-10-CM

## 2021-09-20 DIAGNOSIS — Z8261 Family history of arthritis: Secondary | ICD-10-CM

## 2021-09-20 DIAGNOSIS — M797 Fibromyalgia: Secondary | ICD-10-CM

## 2021-09-20 DIAGNOSIS — M170 Bilateral primary osteoarthritis of knee: Secondary | ICD-10-CM

## 2021-09-20 DIAGNOSIS — M79671 Pain in right foot: Secondary | ICD-10-CM

## 2021-09-20 DIAGNOSIS — R5383 Other fatigue: Secondary | ICD-10-CM

## 2021-09-20 DIAGNOSIS — M79642 Pain in left hand: Secondary | ICD-10-CM

## 2021-09-20 DIAGNOSIS — M0609 Rheumatoid arthritis without rheumatoid factor, multiple sites: Secondary | ICD-10-CM

## 2021-09-20 DIAGNOSIS — R700 Elevated erythrocyte sedimentation rate: Secondary | ICD-10-CM

## 2021-10-03 ENCOUNTER — Other Ambulatory Visit: Payer: Self-pay

## 2021-10-03 ENCOUNTER — Ambulatory Visit (INDEPENDENT_AMBULATORY_CARE_PROVIDER_SITE_OTHER): Payer: Medicare Other | Admitting: Physician Assistant

## 2021-10-03 ENCOUNTER — Encounter: Payer: Self-pay | Admitting: Physician Assistant

## 2021-10-03 VITALS — BP 143/81 | HR 65 | Ht 65.0 in | Wt 218.2 lb

## 2021-10-03 DIAGNOSIS — M461 Sacroiliitis, not elsewhere classified: Secondary | ICD-10-CM

## 2021-10-03 DIAGNOSIS — M797 Fibromyalgia: Secondary | ICD-10-CM

## 2021-10-03 DIAGNOSIS — M79641 Pain in right hand: Secondary | ICD-10-CM | POA: Diagnosis not present

## 2021-10-03 DIAGNOSIS — M51369 Other intervertebral disc degeneration, lumbar region without mention of lumbar back pain or lower extremity pain: Secondary | ICD-10-CM

## 2021-10-03 DIAGNOSIS — M79671 Pain in right foot: Secondary | ICD-10-CM

## 2021-10-03 DIAGNOSIS — M0609 Rheumatoid arthritis without rheumatoid factor, multiple sites: Secondary | ICD-10-CM | POA: Diagnosis not present

## 2021-10-03 DIAGNOSIS — M5136 Other intervertebral disc degeneration, lumbar region: Secondary | ICD-10-CM

## 2021-10-03 DIAGNOSIS — Z79899 Other long term (current) drug therapy: Secondary | ICD-10-CM

## 2021-10-03 DIAGNOSIS — M170 Bilateral primary osteoarthritis of knee: Secondary | ICD-10-CM

## 2021-10-03 DIAGNOSIS — Z8639 Personal history of other endocrine, nutritional and metabolic disease: Secondary | ICD-10-CM

## 2021-10-03 DIAGNOSIS — Z8719 Personal history of other diseases of the digestive system: Secondary | ICD-10-CM

## 2021-10-03 DIAGNOSIS — R700 Elevated erythrocyte sedimentation rate: Secondary | ICD-10-CM

## 2021-10-03 DIAGNOSIS — R5383 Other fatigue: Secondary | ICD-10-CM

## 2021-10-03 DIAGNOSIS — H04123 Dry eye syndrome of bilateral lacrimal glands: Secondary | ICD-10-CM

## 2021-10-03 DIAGNOSIS — M79642 Pain in left hand: Secondary | ICD-10-CM

## 2021-10-03 DIAGNOSIS — R682 Dry mouth, unspecified: Secondary | ICD-10-CM

## 2021-10-03 DIAGNOSIS — M5030 Other cervical disc degeneration, unspecified cervical region: Secondary | ICD-10-CM

## 2021-10-03 DIAGNOSIS — M79672 Pain in left foot: Secondary | ICD-10-CM

## 2021-10-03 DIAGNOSIS — Z8261 Family history of arthritis: Secondary | ICD-10-CM

## 2021-10-03 DIAGNOSIS — E063 Autoimmune thyroiditis: Secondary | ICD-10-CM

## 2021-10-03 DIAGNOSIS — R899 Unspecified abnormal finding in specimens from other organs, systems and tissues: Secondary | ICD-10-CM

## 2021-10-05 LAB — CBC WITH DIFFERENTIAL/PLATELET
Absolute Monocytes: 490 cells/uL (ref 200–950)
Basophils Absolute: 20 cells/uL (ref 0–200)
Basophils Relative: 0.3 %
Eosinophils Absolute: 48 cells/uL (ref 15–500)
Eosinophils Relative: 0.7 %
HCT: 42.6 % (ref 35.0–45.0)
Hemoglobin: 14.1 g/dL (ref 11.7–15.5)
Lymphs Abs: 1156 cells/uL (ref 850–3900)
MCH: 29.4 pg (ref 27.0–33.0)
MCHC: 33.1 g/dL (ref 32.0–36.0)
MCV: 88.8 fL (ref 80.0–100.0)
MPV: 12 fL (ref 7.5–12.5)
Monocytes Relative: 7.2 %
Neutro Abs: 5086 cells/uL (ref 1500–7800)
Neutrophils Relative %: 74.8 %
Platelets: 230 10*3/uL (ref 140–400)
RBC: 4.8 10*6/uL (ref 3.80–5.10)
RDW: 13.5 % (ref 11.0–15.0)
Total Lymphocyte: 17 %
WBC: 6.8 10*3/uL (ref 3.8–10.8)

## 2021-10-05 LAB — COMPLETE METABOLIC PANEL WITH GFR
AG Ratio: 1.2 (calc) (ref 1.0–2.5)
ALT: 17 U/L (ref 6–29)
AST: 24 U/L (ref 10–35)
Albumin: 4.6 g/dL (ref 3.6–5.1)
Alkaline phosphatase (APISO): 132 U/L (ref 37–153)
BUN: 7 mg/dL (ref 7–25)
CO2: 22 mmol/L (ref 20–32)
Calcium: 11.6 mg/dL — ABNORMAL HIGH (ref 8.6–10.4)
Chloride: 102 mmol/L (ref 98–110)
Creat: 0.79 mg/dL (ref 0.50–1.05)
Globulin: 4 g/dL (calc) — ABNORMAL HIGH (ref 1.9–3.7)
Glucose, Bld: 64 mg/dL — ABNORMAL LOW (ref 65–99)
Potassium: 4.4 mmol/L (ref 3.5–5.3)
Sodium: 136 mmol/L (ref 135–146)
Total Bilirubin: 1.2 mg/dL (ref 0.2–1.2)
Total Protein: 8.6 g/dL — ABNORMAL HIGH (ref 6.1–8.1)
eGFR: 83 mL/min/{1.73_m2} (ref >=60)

## 2021-10-05 LAB — PROTEIN ELECTROPHORESIS, SERUM, WITH REFLEX
Albumin ELP: 4.7 g/dL (ref 3.8–4.8)
Alpha 1: 0.3 g/dL (ref 0.2–0.3)
Alpha 2: 0.7 g/dL (ref 0.5–0.9)
Beta 2: 0.5 g/dL (ref 0.2–0.5)
Beta Globulin: 0.6 g/dL (ref 0.4–0.6)
Gamma Globulin: 2.2 g/dL — ABNORMAL HIGH (ref 0.8–1.7)
Total Protein: 9.1 g/dL — ABNORMAL HIGH (ref 6.1–8.1)

## 2021-10-05 LAB — C-REACTIVE PROTEIN: CRP: 13.3 mg/L — ABNORMAL HIGH (ref <8.0)

## 2021-10-05 LAB — C3 AND C4
C3 Complement: 184 mg/dL (ref 83–193)
C4 Complement: 38 mg/dL (ref 15–57)

## 2021-10-05 LAB — SEDIMENTATION RATE: Sed Rate: 62 mm/h — ABNORMAL HIGH (ref 0–30)

## 2021-10-05 NOTE — Progress Notes (Signed)
Please review our mutual patient's lab work and advise if she will require any additional workup.  Her RA is well controlled and does not require treatment, so Dr. Estanislado Pandy does not attribute the elevated sed rate to active RA at this time.

## 2021-10-05 NOTE — Progress Notes (Signed)
CBC WNL. Complements WNL. Calcium remains elevated-11.6.  Total protein remains elevated.  No abnormal protein bands noted on SPEP.   CRP and ESR remain elevated and have trended up.    Patient was evaluated by Dr. Alvy Bimler on 06/01/21 for elevated protein and elevated sed rate.  At that time no further work up was recommended and these abnormal lab values were felt to be due to RA and thyroiditis.  This patients inflammatory arthritis has been well controlled and does not require treatment at this time. She did not have any inflammation on exam at her last office visit on 10/03/21.   I reviewed lab work with Dr. Estanislado Pandy and she recommended reaching out to Dr. Alvy Bimler to clarify if any further workup is necessary at this time.

## 2021-10-06 ENCOUNTER — Telehealth: Payer: Self-pay | Admitting: Rheumatology

## 2021-10-06 NOTE — Telephone Encounter (Signed)
Patient advised she does not need a further work up from hematology. Patient advised she should contact PCP for work up of hypercalcemia.  Patient expressed understanding.

## 2021-10-06 NOTE — Progress Notes (Signed)
Dr. Esperanza Heir you for your quick response.   Andrea-Please advise the patient to follow up with PCP for workup of hypercalcemia.

## 2021-10-06 NOTE — Telephone Encounter (Signed)
Patient states she had seen the female Oncologist at [facility] last, and was not happy with her. Patient would like to be referred somewhere else. Please call to advise.

## 2021-12-22 NOTE — Progress Notes (Signed)
Virtual Visit via Telephone Note  I connected with Meghan Owen on 12/23/21 at 12:00 PM EST by telephone and verified that I am speaking with the correct person using two identifiers. I discussed the limitations, risks, security and privacy concerns of performing an evaluation and management service by telephone and the availability of in person appointments. I also discussed with the patient that there may be a patient responsible charge related to this service. The patient expressed understanding and agreed to proceed.  Location: Patient: Home  Provider: In the office  CC: Discuss AVISE lab results  History of Present Illness: Patient is a 66 year old female with a past medical history of intermittent swelling in multiple joints.  She gives history of pain and swelling in her hands, knee joints and her feet.  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  Then she was given the diagnosis of seronegative rheumatoid arthritis.  Anti-carP antibodies for positive initially but she became negative.  She also has low titer positive ANA.  All other serologies negative.  During the multiple office visits no synovitis was noted on the examination.  She has known history of osteoarthritis, degenerative disc disease of cervical spine and facet joint arthropathy of lumbar spine which causes chronic pain.  She also has history of fibromyalgia syndrome. She has had some increased discomfort in the left ankle joint.  She was evaluated by her podiatrist who updated x-rays consistent with osteoarthritis per patient.   She requested to review AVISE results.   Patient reports morning stiffness for 1 hour Patient denies nocturnal pain.  Difficulty dressing/grooming: Denies Difficulty climbing stairs: Denies Difficulty getting out of chair: Reports Difficulty using hands for taps, buttons, cutlery, and/or writing: Denies  Review of Systems  Constitutional:  Positive for malaise/fatigue.   HENT:  Positive for ear pain.   Eyes:  Positive for pain.  Respiratory:  Negative for shortness of breath.   Cardiovascular:  Negative for leg swelling.  Gastrointestinal:  Positive for constipation.  Genitourinary:  Positive for frequency.  Musculoskeletal:  Positive for joint pain.  Skin:  Negative for rash.  Neurological:  Negative for weakness.  Endo/Heme/Allergies:  Bruises/bleeds easily.  Psychiatric/Behavioral:  The patient has insomnia.     Observations/Objective:  Labs: October 27, 2021 AVISE lupus index -2.7, ANA 1: 80NS, ENA negative, CB CAP negative, Jo 1 negative, anticardiolipin negative, beta-2 GP 1 negative, anti-phosphatidylserine negative antihistone negative, C1q negative, RF negative, anti-CCP negative, anti-carP negative, antiribosomal P-, antithyroglobulin positive, antithyroid peroxidase positive  Physical Exam Neurological:     Mental Status: She is alert and oriented to person, place, and time.  Psychiatric:        Mood and Affect: Mood and affect normal.        Cognition and Memory: Memory normal.        Judgment: Judgment normal.     Assessment and Plan:  Visit Diagnoses: Palindromic rheumatism- RF-, Anti-CCP-, Anti-CarP+ in the past which is negative now.  Low titer positive ANA., family history of rheumatoid arthritis: Patient had no synovitis on examination at any of the visits.  Patient gives history of recurrent intermittent swelling: AVISE results were discussed in detail and all questions were addressed: 10/27/21: AVISE lupus index -2.7, ANA 1: 80NS, ENA negative, CB CAP negative, Jo 1 negative, anticardiolipin negative, beta-2 GP 1 negative, anti-phosphatidylserine negative antihistone negative, C1q negative, RF negative, anti-CCP negative, anti-carP negative, antiribosomal P-, antithyroglobulin positive, antithyroid peroxidase positive.   She has not had any  active synovitis at her most recent office visits.  She has been experiencing increased  pain in the left ankle joint and was evaluated by her podiatrist. She had updated x-rays consistent with osteoarthritic changes per patient.    Pain in both hands-X-rays of both hands consistent with early osteoarthritis on 03/28/21. She gives history of episodic joint swelling.  No synovitis was noted during the visits.  She has not had any increased pain or inflammation in her hands recently.    Primary osteoarthritis of both knees -Previous x-rays showed left knee joint mild osteoarthritis and severe chondromalacia patella and right knee joint and osteoarthritis and chondromalacia patella.  She experiences intermittent discomfort in both knee joints.     Pain in both feet-X-rays of both feet consistent with osteoarthritis on 03/28/21.  She gives history of intermittent swelling in the ankles and her feet.  No synovitis was noted during the previous visits.  She was recently evaluated by podiatry who updated x-rays which were consistent with OA per patient.    Fibromyalgia-She has history of generalized pain and hyperalgesia.  According to the patient her PCP said she does not have fibromyalgia.     DDD (degenerative disc disease), cervical - S/p fusion-Dr. Stern 2017: EMG on 02/21/21: evaluation of the left upper extremity was unremarkable.   Osteoarthritis of both sacroiliac joints (HCC) - HLA-B27 negative, MRI negative for sacroiliitis. MRI on 01/01/2018 revealed chronic fusion of superior aspect of right SI joint.   DDD (degenerative disc disease), lumbar - X-rays of the lumbar spine were consistent with facet joint arthropathy and mild dextroscoliosis. MRI of lumbar spine performed on 12/20/20.   Dry eyes-she uses over-the-counter products.   Dry mouth: She is not experiencing any dry mouth at this time.    Family history of rheumatoid arthritis - mother   Elevated sed rate -she continues to have elevated sedimentation rate.  Sedimentation rate on October 03, 2021 was 62. Advised to follow up  with PCP for further evaluation of elevated sed rate.    Other fatigue: Stable.   Other medical conditions are listed as follows:    History of hyperparathyroidism - She is followed by Dr. Buddy Duty.   History of gastroesophageal reflux (GERD)   Hashimoto's thyroiditis - Followed by Dr. Buddy Duty.  Positive antithyroglobulin and positive antithyroid peroxidase antibodies.  Follow Up Instructions: She will follow up in 1 year.    I discussed the assessment and treatment plan with the patient. The patient was provided an opportunity to ask questions and all were answered. The patient agreed with the plan and demonstrated an understanding of the instructions.   The patient was advised to call back or seek an in-person evaluation if the symptoms worsen or if the condition fails to improve as anticipated.  Scribed byHazel Sams, PA-C  I reviewed the above note and I attest to the accuracy of the documentation.   Bo Merino, MD

## 2021-12-23 ENCOUNTER — Ambulatory Visit (INDEPENDENT_AMBULATORY_CARE_PROVIDER_SITE_OTHER): Payer: Medicare Other | Admitting: Rheumatology

## 2021-12-23 ENCOUNTER — Encounter: Payer: Self-pay | Admitting: Rheumatology

## 2021-12-23 VITALS — Ht 65.0 in

## 2021-12-23 DIAGNOSIS — Z8639 Personal history of other endocrine, nutritional and metabolic disease: Secondary | ICD-10-CM

## 2021-12-23 DIAGNOSIS — M79641 Pain in right hand: Secondary | ICD-10-CM

## 2021-12-23 DIAGNOSIS — M79642 Pain in left hand: Secondary | ICD-10-CM

## 2021-12-23 DIAGNOSIS — Z8261 Family history of arthritis: Secondary | ICD-10-CM

## 2021-12-23 DIAGNOSIS — E063 Autoimmune thyroiditis: Secondary | ICD-10-CM

## 2021-12-23 DIAGNOSIS — M47816 Spondylosis without myelopathy or radiculopathy, lumbar region: Secondary | ICD-10-CM

## 2021-12-23 DIAGNOSIS — M0609 Rheumatoid arthritis without rheumatoid factor, multiple sites: Secondary | ICD-10-CM

## 2021-12-23 DIAGNOSIS — M79672 Pain in left foot: Secondary | ICD-10-CM

## 2021-12-23 DIAGNOSIS — M170 Bilateral primary osteoarthritis of knee: Secondary | ICD-10-CM

## 2021-12-23 DIAGNOSIS — Z8719 Personal history of other diseases of the digestive system: Secondary | ICD-10-CM

## 2021-12-23 DIAGNOSIS — M5136 Other intervertebral disc degeneration, lumbar region: Secondary | ICD-10-CM

## 2021-12-23 DIAGNOSIS — M79671 Pain in right foot: Secondary | ICD-10-CM

## 2021-12-23 DIAGNOSIS — M1230 Palindromic rheumatism, unspecified site: Secondary | ICD-10-CM

## 2021-12-23 DIAGNOSIS — R700 Elevated erythrocyte sedimentation rate: Secondary | ICD-10-CM

## 2021-12-23 DIAGNOSIS — H04123 Dry eye syndrome of bilateral lacrimal glands: Secondary | ICD-10-CM

## 2021-12-23 DIAGNOSIS — M797 Fibromyalgia: Secondary | ICD-10-CM

## 2021-12-23 DIAGNOSIS — R682 Dry mouth, unspecified: Secondary | ICD-10-CM

## 2021-12-23 DIAGNOSIS — M5030 Other cervical disc degeneration, unspecified cervical region: Secondary | ICD-10-CM

## 2022-01-24 ENCOUNTER — Telehealth: Payer: Self-pay | Admitting: Cardiology

## 2022-01-24 NOTE — Telephone Encounter (Signed)
Pt called c/o chest tightness that's been occurring intermittently over "last few weeks," but states she is noticing it more lately. Denies pain, only describes as a tight sensation in the middle of her chest that lasts "a few seconds, then goes away." Pt denies n/v, arm or jaw pain, shoulder pain, SOB, dizziness, or headaches. Pt states that the tight sensation does not worsen with exertion, but denies increased activity as of late to account for it. States she doesn't regularly check her BP at home, but at last check "it was normal." Pt states she is just concerned because she's aging and states 'I know I haven't been taking care of my heart really." Placed pt on schedule for first available acute slot.

## 2022-01-24 NOTE — Telephone Encounter (Signed)
°  Per MyChart scheduling message:  Pt c/o of Chest Pain: STAT if CP now or developed within 24 hours  1. Are you having CP right now? No, not so much pain as it is more chest tightness  2. Are you experiencing any other symptoms (ex. SOB, nausea, vomiting, sweating)? no  3. How long have you been experiencing CP? Started a few weeks ago  4. Is your CP continuous or coming and going? Comes and goes  5. Have you taken Nitroglycerin? no ?

## 2022-01-26 ENCOUNTER — Other Ambulatory Visit: Payer: Self-pay

## 2022-01-26 ENCOUNTER — Encounter: Payer: Self-pay | Admitting: Internal Medicine

## 2022-01-26 ENCOUNTER — Ambulatory Visit (INDEPENDENT_AMBULATORY_CARE_PROVIDER_SITE_OTHER): Payer: Medicare Other | Admitting: Internal Medicine

## 2022-01-26 VITALS — BP 140/80 | HR 60 | Ht 65.0 in | Wt 213.8 lb

## 2022-01-26 DIAGNOSIS — R7309 Other abnormal glucose: Secondary | ICD-10-CM

## 2022-01-26 DIAGNOSIS — R079 Chest pain, unspecified: Secondary | ICD-10-CM

## 2022-01-26 DIAGNOSIS — R002 Palpitations: Secondary | ICD-10-CM

## 2022-01-26 NOTE — Progress Notes (Signed)
Cardiology Office Note   Date:  01/26/2022   ID:  EH SAUSEDA, DOB January 26, 1956, MRN [id number]  PCP:  Harlan Stains, MD  Cardiologist:   Dorris Carnes, MD     History of Present Illness: Meghan Owen is a 66 y.o. female with a history of palpitations and thyroiditis   She called in complaining of squeezing chest pain   Happens anytime, with and without activity   Random   NOt every day   On and off   Started 2 wks ago .   Spells last a few seconds   Enough to get attention. Denies recent infection  No problems swallowing food  No SOB  No N/   Never had before   Not position.   Not pleuritic     Patient walks  Does get a little winded with walking   Does some strength training       Current Meds  Medication Sig   Ascorbic Acid (VITAMIN C PO) Take 1 tablet by mouth daily.   b complex vitamins tablet Take 1 tablet by mouth daily.   Cholecalciferol (VITAMIN D3) 5000 UNITS CAPS Take 5,000 Units by mouth daily.    Digestive Enzymes (DIGESTIVE ENZYME PO) Take by mouth.   Menatetrenone (VITAMIN K2) 100 MCG TABS See admin instructions.   Probiotic Product (PROBIOTIC PO) Take 1 tablet by mouth daily.   TURMERIC PO Take 1,500 mg by mouth as needed.   UNABLE TO FIND Med Name: Curamin 1 tablet as needed 350mg    vitamin E 1000 UNIT capsule Take by mouth.     Allergies:   Aspirin, Other, Penicillin v potassium, Caffeine, Iodinated contrast media, Iodine-131, Ioxaglate, Penicillins, and Prednisone   Past Medical History:  Diagnosis Date   Arthritis    Bursitis, hip    bilateral   DDD (degenerative disc disease), lumbar    Dyslipidemia    Elevated sed rate    Fatty liver    Fibromyalgia    GERD (gastroesophageal reflux disease)    HX   Hashimoto's thyroiditis    Headache    Hypercalcemia    Hyperparathyroidism ([place])    Dr. Elyse Hsu   Hypothyroidism    Liver cyst    BENIGN   Obesity    Osteoarthritis    Parathyroid disorder ([place])    HYPERPARATHYROID   Paresthesia     Peripheral neuropathy    Spinal stenosis    Synovial cyst of elbow    Thyroid disease    Tremor    Vitamin D deficiency     Past Surgical History:  Procedure Laterality Date   ABDOMINAL HYSTERECTOMY     ANKLE SURGERY     ANTERIOR CERVICAL DECOMPRESSION/DISCECTOMY FUSION 4 LEVELS N/A 09/05/2016   Procedure: Cervical three-four Cervical four-five  Cervical five-six Cervical six-seven  Anterior cervical decompression/diskectomy/fusion;  Surgeon: Erline Levine, MD;  Location: [facility];  Service: Neurosurgery;  Laterality: N/A;   CARPAL TUNNEL RELEASE Left 02/26/2018   ELBOW SURGERY  04/2017   cyst removed    FOOT SURGERY     TOE SURGERY Bilateral      Social History:  The patient  reports that she quit smoking about 25 years ago. Her smoking use included cigarettes. She has never used smokeless tobacco. She reports that she does not drink alcohol and does not use drugs.   Family History:  The patient's family history includes Cancer in her sister and another family member; Dementia in her father; Healthy in her  daughter, son, and son; Leukemia in her father; Lupus in her mother; Rheum arthritis in her mother.    ROS:  Please see the history of present illness. All other systems are reviewed and  Negative to the above problem except as noted.    PHYSICAL EXAM: VS:  BP 140/80 (BP Location: Left Arm, Patient Position: Sitting, Cuff Size: Normal)    Wt 213 lb 12.8 oz (97 kg)    BMI 35.58 kg/m   GEN: Obese 66 yo  in no acute distress  HEENT: normal  Neck: no JVD, no carotid bruits Cardiac: RRR; no murmurs  No LE edema  Respiratory:  clear to auscultation bilaterally,  GI: soft, nontender, nondistended, + BS  No hepatomegaly  MS: no deformity Moving all extremities   Skin: warm and dry, no rash Neuro:  Strength and sensation are intact Psych: euthymic mood, full affect   EKG:  EKG is ordered today.  SB 58 bpm       Lipid Panel No results found for: CHOL, TRIG, HDL, CHOLHDL,  VLDL, LDLCALC, LDLDIRECT    Wt Readings from Last 3 Encounters:  01/26/22 213 lb 12.8 oz (97 kg)  10/03/21 218 lb 3.2 oz (99 kg)  07/05/21 230 lb 9.6 oz (104.6 kg)      ASSESSMENT AND PLAN:  1  Chest discomfort   Atypical   Very short lived  Not associated with activity    Pt very  concerned about CAD (Hx poss parathyroid issues; had normal caroitd scan in past) Recomm   I would recomm Ca score CT to see burden of plaquing    This will guide aggressiveness of follow up/interrogation Follow for now    continue to watch for associations   2  Lipids   Will check Lipomed, Lpa, ApoB  3  Glucose   Will check A1C     4  Plapitations   Follow   Pt is not that troubled by      Current medicines are reviewed at length with the patient today.  The patient does not have concerns regarding medicines.  Signed, Dorris Carnes, MD  01/26/2022 8:37 AM    [address], [place], [place]  [address] Phone: [phone]; Fax: [phone]

## 2022-01-26 NOTE — Patient Instructions (Signed)
Medication Instructions:   *If you need a refill on your cardiac medications before your next appointment, please call your pharmacy*   Lab Work: [place]   If you have labs (blood work) drawn today and your tests are completely normal, you will receive your results only by: [place] (if you have MyChart) OR A paper copy in the mail If you have any lab test that is abnormal or we need to change your treatment, we will call you to review the results.   Testing/Procedures:  CALCIUM SCORE     Follow-Up: At [facility], you and your health needs are our priority.  As part of our continuing mission to provide you with exceptional heart care, we have created designated Provider Care Teams.  These Care Teams include your primary Cardiologist (physician) and Advanced Practice Providers (APPs -  Physician Assistants and Nurse Practitioners) who all work together to provide you with the care you need, when you need it.  We recommend signing up for the patient portal called "MyChart".  Sign up information is provided on this After Visit Summary.  MyChart is used to connect with patients for Virtual Visits (Telemedicine).  Patients are able to view lab/test results, encounter notes, upcoming appointments, etc.  Non-urgent messages can be sent to your provider as well.   To learn more about what you can do with MyChart, go to [email or web address].

## 2022-01-27 LAB — NMR, LIPOPROFILE
Cholesterol, Total: 179 mg/dL (ref 100–199)
HDL Particle Number: 32.1 umol/L (ref >=30.5)
HDL-C: 61 mg/dL (ref >39)
LDL Particle Number: 945 nmol/L (ref <1000)
LDL Size: 21.7 nm (ref >20.5)
LDL-C (NIH Calc): 109 mg/dL — ABNORMAL HIGH (ref 0–99)
LP-IR Score: <25 (ref <=45)
Small LDL Particle Number: 162 nmol/L (ref <=527)
Triglycerides: 46 mg/dL (ref 0–149)

## 2022-01-27 LAB — HEMOGLOBIN A1C
Est. average glucose Bld gHb Est-mCnc: 123 mg/dL
Hgb A1c MFr Bld: 5.9 % — ABNORMAL HIGH (ref 4.8–5.6)

## 2022-01-27 LAB — APOLIPOPROTEIN B: Apolipoprotein B: 96 mg/dL — ABNORMAL HIGH

## 2022-01-27 LAB — LIPOPROTEIN A (LPA): Lipoprotein (a): 170.8 nmol/L — ABNORMAL HIGH

## 2022-02-18 ENCOUNTER — Emergency Department (HOSPITAL_COMMUNITY): Payer: Medicare Other

## 2022-02-18 ENCOUNTER — Encounter (HOSPITAL_COMMUNITY): Payer: Self-pay | Admitting: Emergency Medicine

## 2022-02-18 ENCOUNTER — Other Ambulatory Visit: Payer: Self-pay

## 2022-02-18 ENCOUNTER — Emergency Department (HOSPITAL_COMMUNITY)
Admission: EM | Admit: 2022-02-18 | Discharge: 2022-02-18 | Disposition: A | Discharge status: Home / Self Care | Payer: Medicare Other | Attending: Emergency Medicine | Admitting: Emergency Medicine

## 2022-02-18 DIAGNOSIS — M25552 Pain in left hip: Secondary | ICD-10-CM | POA: Insufficient documentation

## 2022-02-18 MED ORDER — KETOROLAC TROMETHAMINE 15 MG/ML IJ SOLN
15.0000 mg | Freq: Once | INTRAMUSCULAR | Status: DC
  Filled 2022-02-18: qty 1

## 2022-02-18 NOTE — ED Triage Notes (Signed)
Pt to the ED with complaints of left hip pain. Pt states she is unable to bare weight on the left leg. ? ?

## 2022-02-18 NOTE — Discharge Instructions (Signed)
Please take anti-inflammatories the next several days to help with inflammation and pain.  Please follow-up with your orthopedist for further evaluation.  Return to the emergency department for any worsening symptoms you have. ?

## 2022-02-18 NOTE — ED Provider Notes (Signed)
?[facility] ?Provider Note ? ? ?CSN: [id number] ?Arrival date & time: 02/18/22  1023 ? ?  ? ?History ?Chief Complaint  ?Patient presents with  ? Hip Pain  ? ? ?Meghan Owen is a 66 y.o. female with history of left hip osteoarthritis who presents the emergency department with left hip pain started last night.  Patient states she was exercising yesterday without any difficulty and then as the evening progressed she was unable to bear weight on the left hip.  Patient trouble sleeping secondary to pain.  Patient states she is been walking with a limp today.  No weakness or numbness to the hip or leg. ? ? ?Hip Pain ? ? ?  ? ?Home Medications ?Prior to Admission medications   ?Medication Sig Start Date End Date Taking? Authorizing Provider  ?Ascorbic Acid (VITAMIN C PO) Take 1 tablet by mouth daily.    [provider]  ?b complex vitamins tablet Take 1 tablet by mouth daily.    [provider]  ?Cholecalciferol (VITAMIN D3) 5000 UNITS CAPS Take 5,000 Units by mouth daily.     [provider]  ?Digestive Enzymes (DIGESTIVE ENZYME PO) Take by mouth.    [provider]  ?Menatetrenone (VITAMIN K2) 100 MCG TABS See admin instructions.    [provider]  ?Probiotic Product (PROBIOTIC PO) Take 1 tablet by mouth daily.    [provider]  ?TURMERIC PO Take 1,500 mg by mouth as needed.    [provider]  ?UNABLE TO FIND Med Name: Curamin 1 tablet as needed '350mg'$     [provider]  ?vitamin E 1000 UNIT capsule Take by mouth.    [provider]  ?   ? ?Allergies    ?Aspirin, Other, Penicillin v potassium, Caffeine, Iodinated contrast media, Iodine-131, Ioxaglate, Penicillins, and Prednisone   ? ?Review of Systems   ?Review of Systems  ?All other systems reviewed and are negative. ? ?Physical Exam ?Updated Vital Signs ?BP 136/76 (BP Location: Left Arm)   Pulse (!) 59   Temp 98.2 ?F (36.8 ?C) (Oral)   Resp 15   Ht '5\' 5"'$   (1.651 m)   Wt 99.8 kg   SpO2 100%   BMI 36.61 kg/m?  ?Physical Exam ?Vitals and nursing note reviewed.  ?Constitutional:   ?   Appearance: Normal appearance.  ?HENT:  ?   Head: Normocephalic and atraumatic.  ?Eyes:  ?   General:     ?   Right eye: No discharge.     ?   Left eye: No discharge.  ?   Conjunctiva/sclera: Conjunctivae normal.  ?Pulmonary:  ?   Effort: Pulmonary effort is normal.  ?Musculoskeletal:  ?   Comments: There is tenderness to the left lateral hip.  No weakness or numbness to the left leg.  No leg swelling.  ?Skin: ?   General: Skin is warm and dry.  ?   Findings: No rash.  ?Neurological:  ?   General: No focal deficit present.  ?   Mental Status: She is alert.  ?Psychiatric:     ?   Mood and Affect: Mood normal.     ?   Behavior: Behavior normal.  ? ? ?ED Results / Procedures / Treatments   ?Labs ?(all labs ordered are listed, but only abnormal results are displayed) ?Labs Reviewed - No data to display ? ?EKG ?None ? ?Radiology ?DG Hip Unilat W or Wo Pelvis 2-3 Views Left ? ?Result Date: 02/18/2022 ?  CLINICAL DATA:  Left hip pain.  History of osteoarthritis EXAM: DG HIP (WITH OR WITHOUT PELVIS) 3V LEFT COMPARISON:  None. FINDINGS: There is no evidence of hip fracture or dislocation. Mild hip spurring asymmetric to the left with mild superior joint space narrowing. IMPRESSION: No acute finding. Mild spurring and joint space narrowing at the left hip. Electronically Signed   By: Jorje Guild M.D.   On: 02/18/2022 11:55   ? ?Procedures ?Procedures  ? ? ?Medications Ordered in ED ?Medications  ?ketorolac (TORADOL) 15 MG/ML injection 15 mg (15 mg Intramuscular Not Given 02/18/22 1142)  ? ? ?ED Course/ Medical Decision Making/ A&P ?Clinical Course as of 02/18/22 1207  ?Sat Feb 18, 2022  ?1148 Patient denied Toradol. [CF]  ?  ?Clinical Course User Index ?[CF] Myna Bright M, PA-C  ? ?                        ?Medical Decision Making ?Amount and/or Complexity of Data Reviewed ?Radiology:  ordered. ? ?Risk ?Prescription drug management. ? ? ?Meghan Owen is a 67 y.o. female with history of osteoarthritis of the left hip who presents the emergency department with left hip pain.  No significant history of trauma or injury to the hip.  This is likely an overuse injury from exercise yesterday.  Could also be trochanteric bursitis considering that there is tenderness over the lateral hip.  No evidence of fracture or dislocation on imaging.  I offered patient Toradol which she declined.  Patient requesting crutches.  These were provided in the emergency department.  Patient has an orthopedist and will give them a call for follow-up.  I gave her some range of motion exercises she will work on.  Also instructed her to take some anti-inflammatories at home.  Patient amenable this plan.  Strict turn precautions given.  She is safe for discharge. ? ?Final Clinical Impression(s) / ED Diagnoses ?Final diagnoses:  ?Left hip pain  ? ? ?Rx / DC Orders ?ED Discharge Orders   ? ? None  ? ?  ? ? ?  ?Hendricks Limes, PA-C ?02/18/22 1207 ? ?  ?Lajean Saver, MD ?02/20/22 1502 ? ?

## 2022-03-15 ENCOUNTER — Inpatient Hospital Stay: Admission: RE | Admit: 2022-03-15 | Payer: BLUE CROSS/BLUE SHIELD | Source: Ambulatory Visit

## 2022-04-03 ENCOUNTER — Ambulatory Visit: Payer: BLUE CROSS/BLUE SHIELD | Admitting: Rheumatology

## 2022-04-06 ENCOUNTER — Inpatient Hospital Stay: Admission: RE | Admit: 2022-04-06 | Payer: Self-pay | Source: Ambulatory Visit

## 2022-05-12 ENCOUNTER — Other Ambulatory Visit: Payer: Self-pay | Admitting: Family Medicine

## 2022-05-12 DIAGNOSIS — M25552 Pain in left hip: Secondary | ICD-10-CM

## 2022-05-22 ENCOUNTER — Other Ambulatory Visit: Payer: Self-pay

## 2022-05-24 ENCOUNTER — Other Ambulatory Visit: Payer: Self-pay | Admitting: Sports Medicine

## 2022-05-24 DIAGNOSIS — M5442 Lumbago with sciatica, left side: Secondary | ICD-10-CM

## 2022-06-07 ENCOUNTER — Ambulatory Visit
Admission: RE | Admit: 2022-06-07 | Discharge: 2022-06-07 | Disposition: A | Discharge status: Home / Self Care | Payer: Self-pay | Source: Ambulatory Visit | Attending: Internal Medicine | Admitting: Internal Medicine

## 2022-06-07 DIAGNOSIS — R079 Chest pain, unspecified: Secondary | ICD-10-CM

## 2022-06-07 DIAGNOSIS — R002 Palpitations: Secondary | ICD-10-CM

## 2022-06-08 ENCOUNTER — Ambulatory Visit
Admission: RE | Admit: 2022-06-08 | Discharge: 2022-06-08 | Disposition: A | Discharge status: Home / Self Care | Payer: Medicare Other | Source: Ambulatory Visit | Attending: Sports Medicine | Admitting: Sports Medicine

## 2022-06-08 ENCOUNTER — Ambulatory Visit
Admission: RE | Admit: 2022-06-08 | Discharge: 2022-06-08 | Disposition: A | Discharge status: Home / Self Care | Payer: Medicare Other | Source: Ambulatory Visit | Attending: Family Medicine | Admitting: Family Medicine

## 2022-06-08 DIAGNOSIS — M5442 Lumbago with sciatica, left side: Secondary | ICD-10-CM

## 2022-06-08 DIAGNOSIS — M25552 Pain in left hip: Secondary | ICD-10-CM

## 2022-07-30 IMAGING — MG MM DIGITAL SCREENING BILAT W/ TOMO AND CAD
8 series · 8 of 24 positions shown · non-contrast
Comparison: None.

CLINICAL DATA: Screening.

EXAM:
DIGITAL SCREENING BILATERAL MAMMOGRAM WITH TOMOSYNTHESIS AND CAD
TECHNIQUE: Bilateral screening digital craniocaudal and mediolateral oblique
mammograms were obtained. Bilateral screening digital breast
tomosynthesis was performed. The images were evaluated with
computer-aided detection.

[R CC synth-2D]
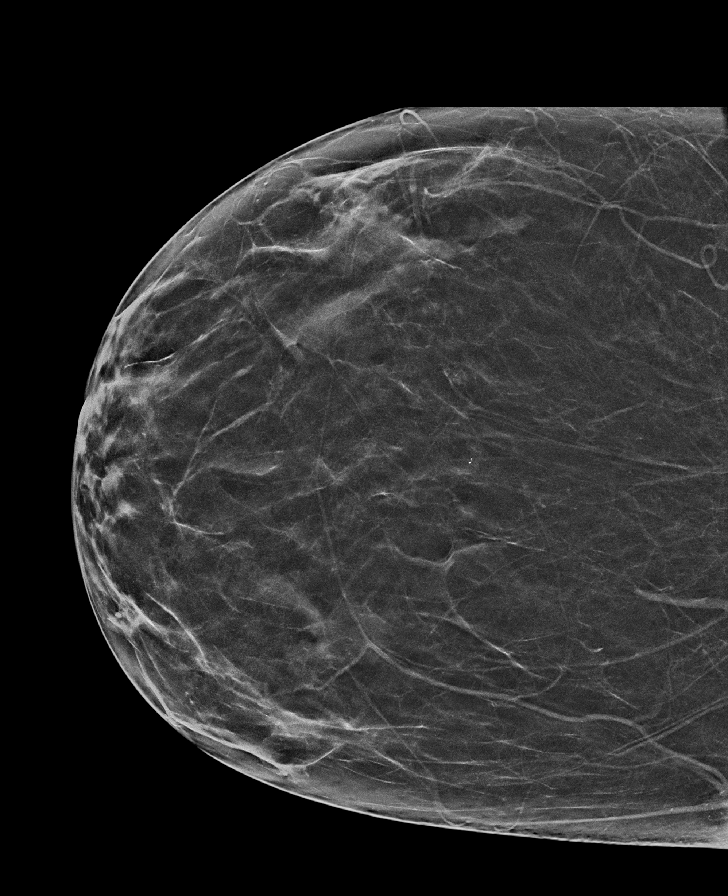

[L MLO synth-2D]
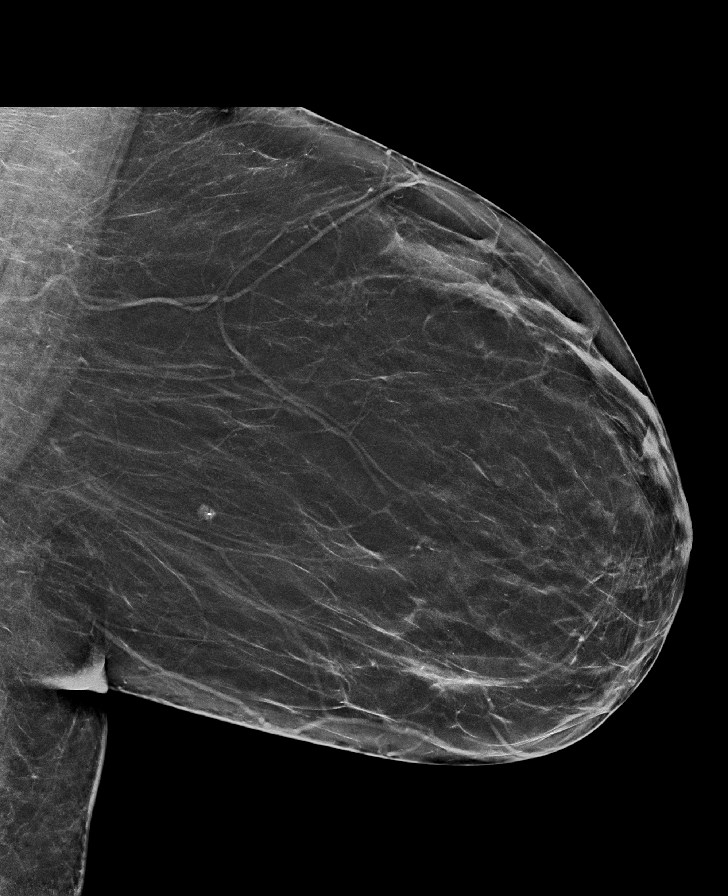

[R MLO synth-2D]
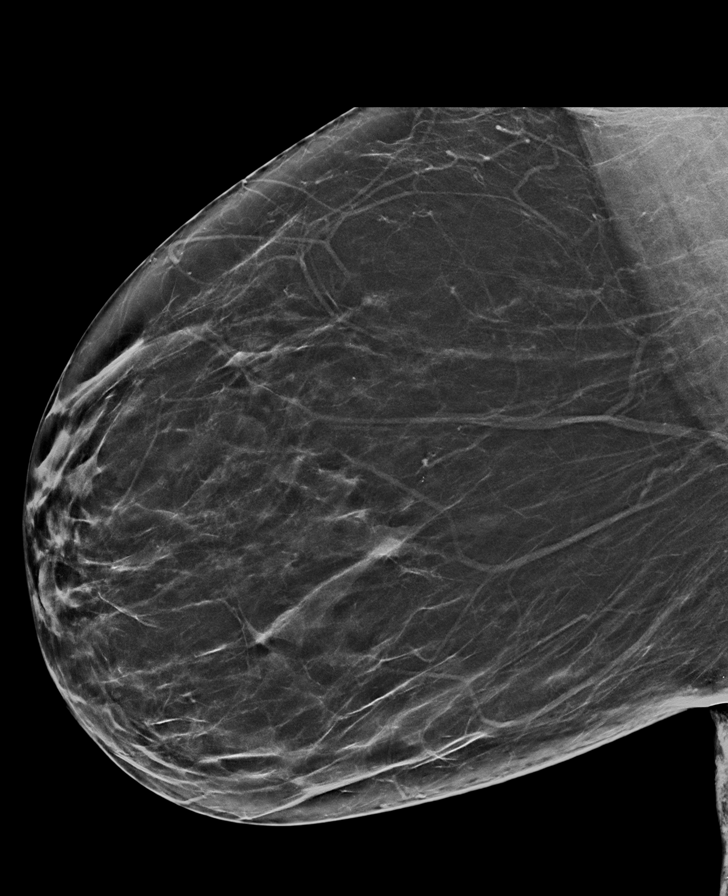

[L CC synth-2D]
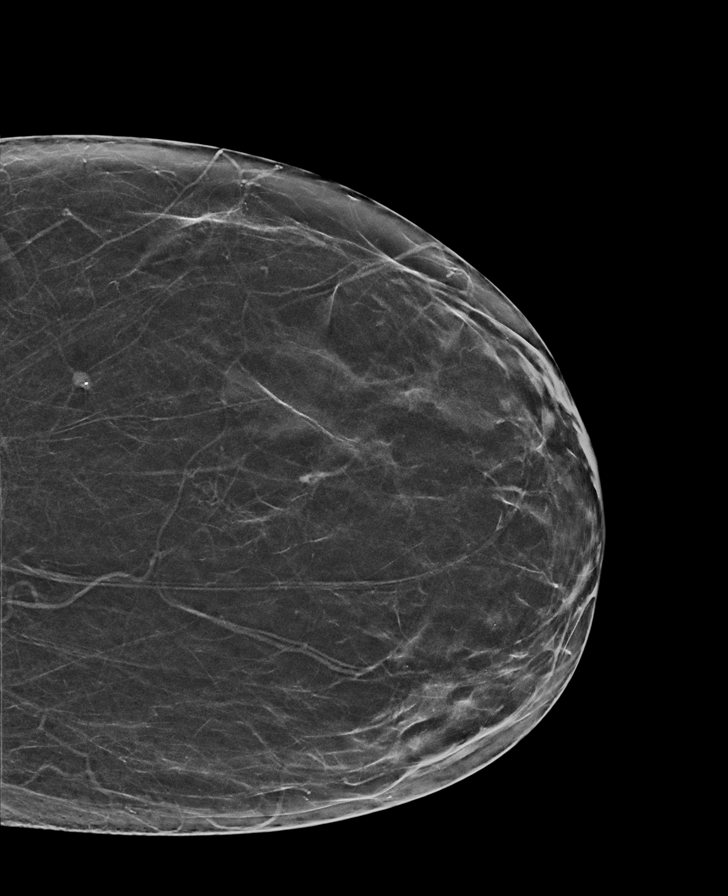

[L MLO tomo · tomo slice 35/70.0]
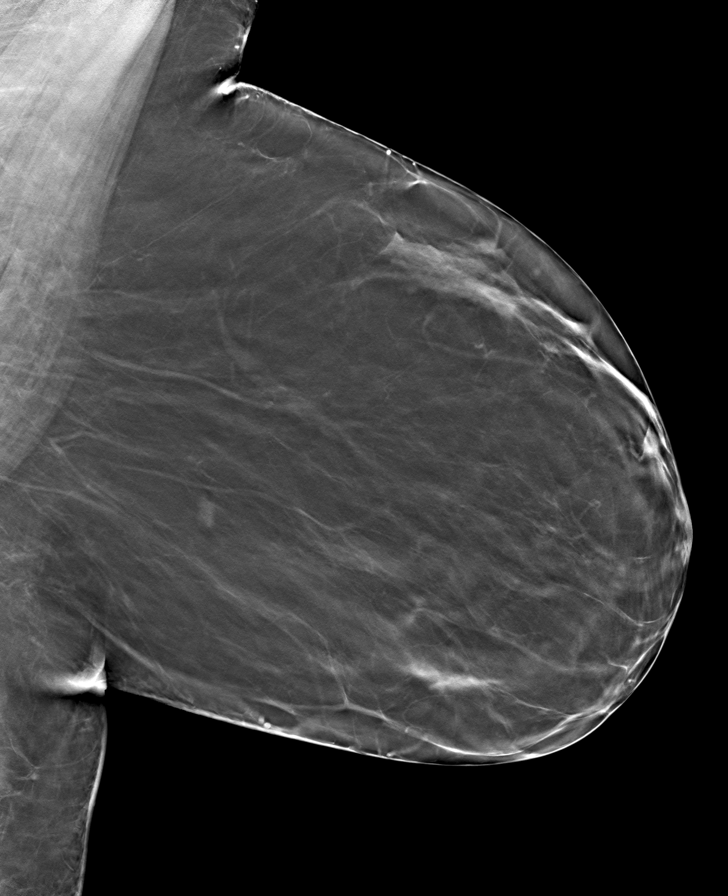

[R CC tomo · tomo slice 33/65.0]
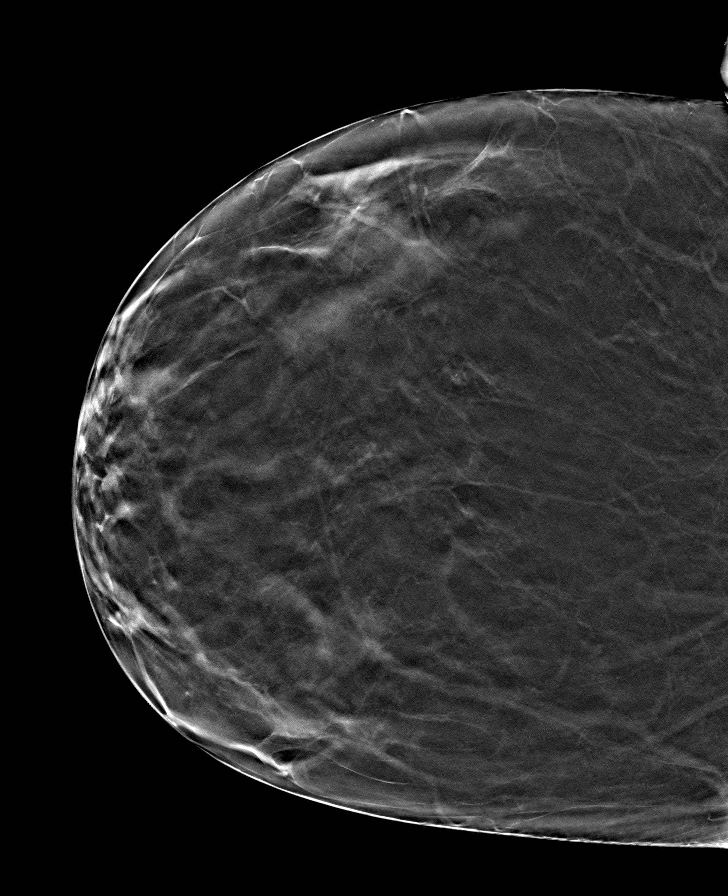

[R MLO tomo · tomo slice 37/72.0]
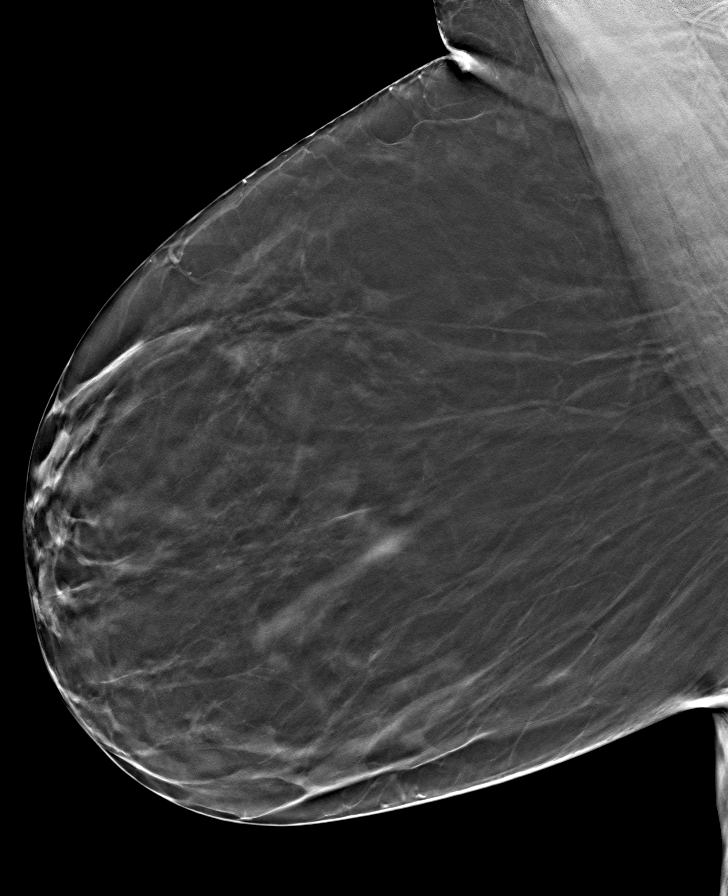

[L CC tomo · tomo slice 31/60.0]
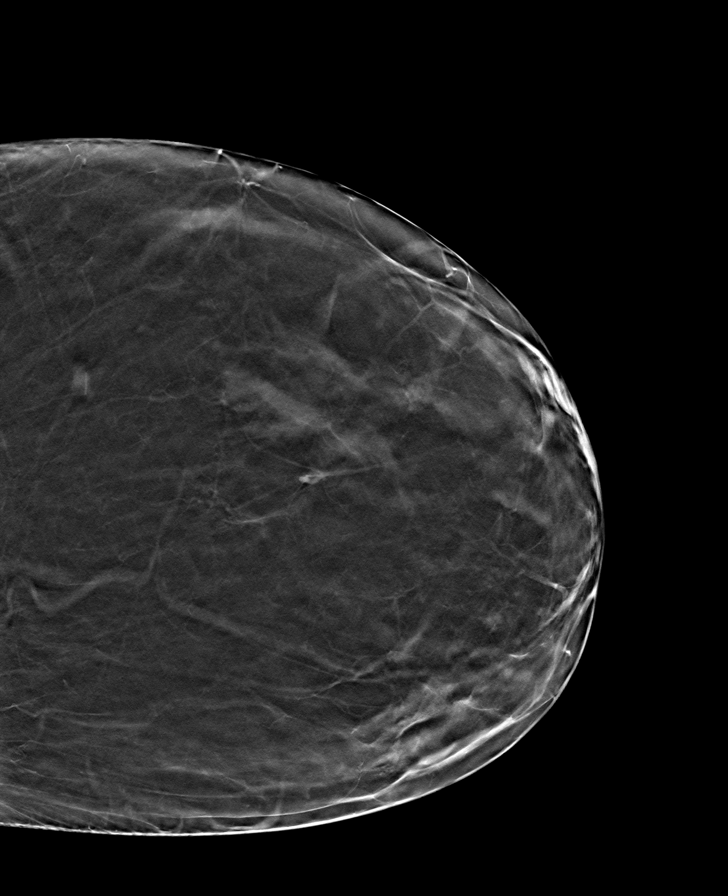

[8 of 24 positions shown; findings below may reference images not displayed]

ACR Breast Density Category b: There are scattered areas of
fibroglandular density.
FINDINGS: There are no findings suspicious for malignancy.
IMPRESSION: No mammographic evidence of malignancy. A result letter of this
screening mammogram will be mailed directly to the patient.

RECOMMENDATION:
Screening mammogram in one year. (Code:XG-X-X7B)

BI-RADS CATEGORY  1: Negative.

## 2022-08-11 ENCOUNTER — Ambulatory Visit: Payer: BLUE CROSS/BLUE SHIELD | Admitting: Neurology

## 2022-08-24 ENCOUNTER — Ambulatory Visit: Payer: BLUE CROSS/BLUE SHIELD | Admitting: Physician Assistant

## 2022-08-24 NOTE — Progress Notes (Signed)
Office Visit Note  Patient: Meghan Owen             Date of Birth: 06/10/1956           MRN: [id number]             PCP: Harlan Stains, MD Referring: Harlan Stains, MD Visit Date: 09/06/2022 Occupation: @GUAROCC @  Subjective:  Increased joint pain   History of Present Illness: Meghan Owen is a 66 y.o. female with history of palindromic rheumatism, OA, and DDD.  Patient presents today with increased generalized pain and joint stiffness.  She experiences generalized neuralgias and myalgias on a daily basis.  She denies any visible joint swelling.  She has undergone a thorough work-up by neurology including a nerve conduction study.  Patient reports that in the past she was initially told that she had fibromyalgia but then was later told that she did not.  She states that she tried Cymbalta in the past but discontinued due to intolerance.  She states she has difficulty taking any over-the-counter products for pain relief due to intolerance. Patient is unsure if her symptoms are due to underlying thyroid disease.  She continues to have ongoing fatigue as well as intermittent migraines.  She would like to have AVISE panel rechecked.   Activities of Daily Living:  Patient reports morning stiffness for 15-20 minutes.   Patient Reports nocturnal pain.  Difficulty dressing/grooming: Denies Difficulty climbing stairs: Reports Difficulty getting out of chair: Reports Difficulty using hands for taps, buttons, cutlery, and/or writing: Denies  Review of Systems  Constitutional:  Positive for fatigue.  HENT:  Negative for mouth sores, mouth dryness and nose dryness.   Eyes:  Negative for pain, visual disturbance and dryness.  Respiratory:  Negative for cough, hemoptysis and difficulty breathing.   Cardiovascular:  Positive for palpitations. Negative for chest pain, hypertension and swelling in legs/feet.  Gastrointestinal:  Positive for constipation. Negative for blood in stool and diarrhea.   Endocrine: Positive for increased urination.  Genitourinary:  Negative for painful urination and involuntary urination.  Musculoskeletal:  Positive for joint pain, joint pain, joint swelling, myalgias, muscle weakness, morning stiffness, muscle tenderness and myalgias. Negative for gait problem.  Skin:  Positive for color change. Negative for pallor, rash, hair loss, nodules/bumps, skin tightness, ulcers and sensitivity to sunlight.  Allergic/Immunologic: Negative for susceptible to infections.  Neurological:  Positive for headaches. Negative for dizziness, numbness and weakness.  Hematological:  Positive for swollen glands.  Psychiatric/Behavioral:  Positive for sleep disturbance. Negative for depressed mood. The patient is not nervous/anxious.     PMFS History:  Patient Active Problem List   Diagnosis Date Noted  . Elevated C-reactive protein (CRP) 06/01/2021  . Paresthesia 01/31/2021  . Osteoarthritis of both sacroiliac joints ([place]) 01/04/2018  . Primary osteoarthritis of both knees 01/04/2018  . DDD (degenerative disc disease), cervical 01/04/2018  . DDD (degenerative disc disease), lumbar 01/04/2018  . Chronic right shoulder pain 01/04/2018  . Fibromyalgia 01/04/2018  . Hashimoto's thyroiditis 01/04/2018  . History of hyperparathyroidism 01/04/2018  . History of gastroesophageal reflux (GERD) 01/04/2018  . Family history of rheumatoid arthritis 01/04/2018  . Cervical myelopathy ([place]) 09/05/2016  . Primary osteoarthritis of left hip 01/04/2016  . Chronic left hip pain 12/15/2014  . Hypercalcemia 09/08/2013  . Varus deformity of foot 08/26/2013  . Peroneal tendonitis 05/23/2013  . Conversion disorder 05/21/2013  . Low back pain 11/01/2012  . Achilles tendinitis 09/05/2012  . Bilateral ankle pain 09/05/2012  .  Hammer toe, acquired 09/05/2012  . Cubital tunnel syndrome on left 08/22/2012  . Stress fracture 07/04/2012  . Lumbar facet arthropathy 05/10/2012  . Diffuse  myofascial pain syndrome 01/25/2012  . GERD 02/05/2009  . OTHER BURSITIS DISORDERS 02/05/2009  . ABNORMAL ELECTROCARDIOGRAM 02/05/2009    Past Medical History:  Diagnosis Date  . Arthritis   . Bursitis, hip    bilateral  . DDD (degenerative disc disease), lumbar   . Dyslipidemia   . Elevated sed rate   . Fatty liver   . Fibromyalgia   . GERD (gastroesophageal reflux disease)    HX  . Hashimoto's thyroiditis   . Headache   . Hypercalcemia   . Hyperparathyroidism ([place])    Dr. Elyse Hsu  . Hypothyroidism   . Liver cyst    BENIGN  . Obesity   . Osteoarthritis   . Parathyroid disorder ([place])    HYPERPARATHYROID  . Paresthesia   . Peripheral neuropathy   . Spinal stenosis   . Synovial cyst of elbow   . Thyroid disease   . Tremor   . Vitamin D deficiency     Family History  Problem Relation Age of Onset  . Lupus Mother   . Rheum arthritis Mother   . Dementia Father   . Leukemia Father   . Cancer Sister        bone   . Healthy Daughter   . Healthy Son   . Healthy Son   . Cancer Other    Past Surgical History:  Procedure Laterality Date  . ABDOMINAL HYSTERECTOMY    . ANKLE SURGERY    . ANTERIOR CERVICAL DECOMPRESSION/DISCECTOMY FUSION 4 LEVELS N/A 09/05/2016   Procedure: Cervical three-four Cervical four-five  Cervical five-six Cervical six-seven  Anterior cervical decompression/diskectomy/fusion;  Surgeon: Erline Levine, MD;  Location: [facility];  Service: Neurosurgery;  Laterality: N/A;  . CARPAL TUNNEL RELEASE Left 02/26/2018  . ELBOW SURGERY  04/2017   cyst removed   . FOOT SURGERY    . TOE SURGERY Bilateral    Social History   Social History Narrative   Caffeine yes, widowed.  3 children.     There is no immunization history on file for this patient.   Objective: Vital Signs: BP (!) 146/83 (BP Location: Left Arm, Patient Position: Sitting, Cuff Size: Large)   Pulse (!) 55   Resp 12   Ht $R'5\' 5"'BI$  (1.651 m)   Wt 229 lb (103.9 kg)   BMI 38.11 kg/m     Physical Exam Vitals and nursing note reviewed.  Constitutional:      Appearance: She is well-developed.  HENT:     Head: Normocephalic and atraumatic.  Eyes:     Conjunctiva/sclera: Conjunctivae normal.  Cardiovascular:     Rate and Rhythm: Normal rate and regular rhythm.     Heart sounds: Normal heart sounds.  Pulmonary:     Effort: Pulmonary effort is normal.     Breath sounds: Normal breath sounds.  Abdominal:     General: Bowel sounds are normal.     Palpations: Abdomen is soft.  Musculoskeletal:     Cervical back: Normal range of motion.  Skin:    General: Skin is warm and dry.     Capillary Refill: Capillary refill takes less than 2 seconds.  Neurological:     Mental Status: She is alert and oriented to person, place, and time.  Psychiatric:        Behavior: Behavior normal.     Musculoskeletal  Exam: Generalized hyperalgesia and positive tender points noted on examination.  C-spine has good range of motion.  Trapezius muscle tension tenderness bilaterally.  Shoulder joints, elbow joints, wrist joints, MCPs, PIPs, DIPs have good range of motion with no synovitis.  Tenderness over the right second and third PIP joints and left fourth and fifth PIP joints.  Complete fist formation bilaterally.  Hip joints have good range of motion with some discomfort in the left hip.  Knee joints have crepitus but no warmth or effusion.  Ankle joints have good range of motion with no tenderness or synovitis.  Dorsal spurs noted bilaterally.  Pes cavus noted bilaterally.  CDAI Exam: CDAI Score: -- Patient Global: --; Provider Global: -- Swollen: --; Tender: -- Joint Exam 09/06/2022   No joint exam has been documented for this visit   There is currently no information documented on the homunculus. Go to the Rheumatology activity and complete the homunculus joint exam.  Investigation: No additional findings.  Imaging: No results found.  Recent Labs: Lab Results  Component Value  Date   WBC 6.8 10/03/2021   HGB 14.1 10/03/2021   PLT 230 10/03/2021   NA 136 10/03/2021   K 4.4 10/03/2021   CL 102 10/03/2021   CO2 22 10/03/2021   GLUCOSE 64 (L) 10/03/2021   BUN 7 10/03/2021   CREATININE 0.79 10/03/2021   BILITOT 1.2 10/03/2021   ALKPHOS 112 05/25/2021   AST 24 10/03/2021   ALT 17 10/03/2021   PROT 8.6 (H) 10/03/2021   PROT 9.1 (H) 10/03/2021   ALBUMIN 4.0 05/25/2021   CALCIUM 11.6 (H) 10/03/2021   GFRAA 102 08/20/2019    Speciality Comments: No specialty comments available.  Procedures:  No procedures performed Allergies: Aspirin, Other, Penicillin v potassium, Caffeine, Iodinated contrast media, Iodine-131, Ioxaglate, Penicillins, and Prednisone   Assessment / Plan:     Visit Diagnoses: Palindromic rheumatism - 10/27/21: AVISE lupus index -2.7, ANA 1: 80NS, antithyroglobulin positive, antithyroid peroxidase positive: She has been experiencing increased arthralgias, joint stiffness, and total body nerve pain.  No synovitis was noted on examination today.  Her symptoms seem consistent with active fibromyalgia since she has generalized hyperalgesia and positive tender points on examination.  She has been experiencing intermittent myalgias and muscle tenderness as well as diffuse neuralgias.  According to the patient she has undergone a thorough work-up by neurology which has been unremarkable. She has no active inflammation or obvious neurologic deficits at this time. She does not require immunosuppressive therapy.  The patient would like to have updated [place] work for further evaluation.  Orders provided to the patient along with previous results from 10/27/21.  Depending on the results we can schedule a virtual visit first in person visit to discuss the interpretation.   Pain in both hands - X-rays of both hands consistent with early osteoarthritis on 03/28/21.  She has been experiencing increased pain and stiffness in both hands.  On examination she has tenderness  over several PIP and DIP joints as well as both CMC joints.  No synovitis was noted.  Complete fist formation noted bilaterally.  Discussed the importance of joint protection and muscle strengthening.  She plans on continuing to take turmeric on a daily basis for the natural anti-inflammatory properties.  Primary osteoarthritis of both knees - Previous x-rays showed left knee joint mild osteoarthritis and severe chondromalacia patella and right knee joint and osteoarthritis and chondromalacia patella.  She has been experiencing increased pain and stiffness in both knee joints.  On examination today she has crepitus but no warmth or effusion.  She cannot tolerate taking NSAIDs or Tylenol over-the-counter for pain relief.  She takes turmeric on a daily basis for the natural anti-inflammatory properties.  Pain in both feet - X-rays of both feet consistent with osteoarthritis on 03/28/21.  She has been experiencing intermittent pain in both ankle joints.  No synovitis was noted on examination today.  Pes cavus noted bilaterally.  Dorsal spurs noted bilaterally.  Discussed the importance of wearing proper fitting shoes.  Fibromyalgia - She has history of generalized pain and hyperalgesia.  According to the patient her PCP said she does not have fibromyalgia.  On examination today she has generalized hyperalgesia and positive tender points.  Discussed that the symptoms she is experiencing including muscle tenderness, myalgias, fatigue, nocturnal pain, and recurrent migraines raises suspicion for fibromyalgia. The patient has tried taking Cymbalta in the past but discontinued due to intolerance.  Discussed that she may benefit from trying gabapentin or Lyrica in the future if she continues to have significant myalgias and neuralgias.  She is apprehensive to take any prescription medications at this time.  Discussed the importance of regular exercise and good sleep hygiene.  Other fatigue: Persistent. Discussed the  importance of regular exercise.   DDD (degenerative disc disease), cervical - S/p fusion-Dr. Stern 2017: EMG on 02/21/21: evaluation of the left upper extremity was unremarkable.  DDD (degenerative disc disease), lumbar - X-rays of the lumbar spine were consistent with facet joint arthropathy and mild dextroscoliosis. MRI of lumbar spine performed on 12/20/20.  Osteoarthritis of both sacroiliac joints ([facility]) - HLA-B27 negative, MRI negative for sacroiliitis. MRI on 01/01/2018 revealed chronic fusion of superior aspect of right SI joint.  Other medical conditions are listed as follows:   Dry eyes  Family history of rheumatoid arthritis - mother  Elevated sed rate  Dry mouth  History of gastroesophageal reflux (GERD)  Hashimoto's thyroiditis - Followed by Dr. Buddy Duty.  Positive antithyroglobulin and positive antithyroid peroxidase antibodies.  History of hyperparathyroidism - She is followed by Dr. Buddy Duty.  Orders: No orders of the defined types were placed in this encounter.  No orders of the defined types were placed in this encounter.  Follow-Up Instructions: Return in about 6 months (around 03/07/2023).   Ofilia Neas, PA-C  Note - This record has been created using Dragon software.  Chart creation errors have been sought, but may not always  have been located. Such creation errors do not reflect on  the standard of medical care.

## 2022-09-01 ENCOUNTER — Other Ambulatory Visit: Payer: Self-pay | Admitting: Internal Medicine

## 2022-09-01 ENCOUNTER — Other Ambulatory Visit (HOSPITAL_COMMUNITY): Payer: Self-pay | Admitting: Internal Medicine

## 2022-09-01 DIAGNOSIS — E210 Primary hyperparathyroidism: Secondary | ICD-10-CM

## 2022-09-06 ENCOUNTER — Ambulatory Visit: Payer: Medicare Other | Attending: Physician Assistant | Admitting: Physician Assistant

## 2022-09-06 ENCOUNTER — Encounter: Payer: Self-pay | Admitting: Physician Assistant

## 2022-09-06 VITALS — BP 146/83 | HR 55 | Resp 12 | Ht 65.0 in | Wt 229.0 lb

## 2022-09-06 DIAGNOSIS — M5030 Other cervical disc degeneration, unspecified cervical region: Secondary | ICD-10-CM

## 2022-09-06 DIAGNOSIS — H04123 Dry eye syndrome of bilateral lacrimal glands: Secondary | ICD-10-CM | POA: Diagnosis present

## 2022-09-06 DIAGNOSIS — M79672 Pain in left foot: Secondary | ICD-10-CM | POA: Diagnosis present

## 2022-09-06 DIAGNOSIS — M5136 Other intervertebral disc degeneration, lumbar region: Secondary | ICD-10-CM

## 2022-09-06 DIAGNOSIS — R682 Dry mouth, unspecified: Secondary | ICD-10-CM

## 2022-09-06 DIAGNOSIS — M79641 Pain in right hand: Secondary | ICD-10-CM | POA: Diagnosis present

## 2022-09-06 DIAGNOSIS — M170 Bilateral primary osteoarthritis of knee: Secondary | ICD-10-CM

## 2022-09-06 DIAGNOSIS — M797 Fibromyalgia: Secondary | ICD-10-CM

## 2022-09-06 DIAGNOSIS — E063 Autoimmune thyroiditis: Secondary | ICD-10-CM

## 2022-09-06 DIAGNOSIS — M1230 Palindromic rheumatism, unspecified site: Secondary | ICD-10-CM

## 2022-09-06 DIAGNOSIS — Z8719 Personal history of other diseases of the digestive system: Secondary | ICD-10-CM | POA: Diagnosis present

## 2022-09-06 DIAGNOSIS — M79642 Pain in left hand: Secondary | ICD-10-CM | POA: Diagnosis present

## 2022-09-06 DIAGNOSIS — R700 Elevated erythrocyte sedimentation rate: Secondary | ICD-10-CM | POA: Diagnosis present

## 2022-09-06 DIAGNOSIS — M461 Sacroiliitis, not elsewhere classified: Secondary | ICD-10-CM

## 2022-09-06 DIAGNOSIS — R5383 Other fatigue: Secondary | ICD-10-CM | POA: Diagnosis present

## 2022-09-06 DIAGNOSIS — M79671 Pain in right foot: Secondary | ICD-10-CM

## 2022-09-06 DIAGNOSIS — Z8639 Personal history of other endocrine, nutritional and metabolic disease: Secondary | ICD-10-CM | POA: Diagnosis present

## 2022-09-06 DIAGNOSIS — Z8261 Family history of arthritis: Secondary | ICD-10-CM | POA: Diagnosis present

## 2022-09-20 ENCOUNTER — Other Ambulatory Visit (HOSPITAL_COMMUNITY): Payer: BLUE CROSS/BLUE SHIELD

## 2022-09-20 ENCOUNTER — Encounter (HOSPITAL_COMMUNITY): Payer: Self-pay

## 2022-09-20 ENCOUNTER — Ambulatory Visit (HOSPITAL_COMMUNITY): Payer: BLUE CROSS/BLUE SHIELD

## 2022-09-21 ENCOUNTER — Encounter: Payer: Self-pay | Admitting: *Deleted

## 2022-09-25 ENCOUNTER — Ambulatory Visit (INDEPENDENT_AMBULATORY_CARE_PROVIDER_SITE_OTHER): Payer: Medicare Other | Admitting: Diagnostic Neuroimaging

## 2022-09-25 ENCOUNTER — Encounter: Payer: Self-pay | Admitting: Diagnostic Neuroimaging

## 2022-09-25 VITALS — BP 142/85 | HR 51 | Ht 65.0 in | Wt 226.0 lb

## 2022-09-25 DIAGNOSIS — R200 Anesthesia of skin: Secondary | ICD-10-CM | POA: Diagnosis not present

## 2022-09-25 DIAGNOSIS — R519 Headache, unspecified: Secondary | ICD-10-CM | POA: Diagnosis not present

## 2022-09-25 DIAGNOSIS — G894 Chronic pain syndrome: Secondary | ICD-10-CM

## 2022-09-25 DIAGNOSIS — H539 Unspecified visual disturbance: Secondary | ICD-10-CM | POA: Diagnosis not present

## 2022-09-25 NOTE — Patient Instructions (Signed)
HEADACHE / VISION CHANGES - suspect migraine; but since new onset in July 2023, and no prior migraine history, will check MRI brain to rule out other causes - then may consider migraine treatments (topiramate, rizatriptan)

## 2022-09-25 NOTE — Progress Notes (Signed)
[facility]  PATIENT: Meghan Owen DOB: 12/24/1955  REFERRING CLINICIAN: Harlan Stains, MD HISTORY FROM: patient  REASON FOR VISIT: new consult   HISTORICAL  CHIEF COMPLAINT:  Chief Complaint  Patient presents with   Numbness    RM 7 alone Pt is well, states she is having the same symptoms she was having last visit in 2022. Burning, tingling and crawling sensation in feet. She is also having headaches     HISTORY OF PRESENT ILLNESS:   UPDATE (09/25/22, VRP): Since last visit, doing about the same in terms general symptoms (migratory, intermittent sharp pinprick sensations throughout her body, dizziness, gait difficulty, vision changes, neck pain).  Also having new onset of headache starting 07/09/2022 with pain in the back of head, seeing half-moon black-and-white sensation, wavy lines, moving up and down her visual field, lasting 10 minutes at a time.  Symptoms associated with nausea and photophobia.  She looked up symptoms online and was concerned about migraine with aura.  Never had these kinds of headaches before in her life.  Still struggling with some issues related to sleep and stress factors but these are improving.  PRIOR (Dr. Jannifer Franklin 01/31/21): Ms. Paget is a 66 year old female with history of cervical spondylosis with surgery, episodic tremor, gait disturbance.  Some question of fibromyalgia in the past.  Has chronically elevated sed rate, CRP.  Has positive ANA, follows with Dr. Estanislado Pandy for undetermined connective tissue disorder, possibly RA.  She has Hashimoto's thyroiditis.  Her cervical spine surgery was in 2007 with Dr. Vertell Limber, over the years, intermittent episodes of prickly sensations through the body.  This sensation worsened after the passing of her husband in December 2020.  Laboratory evaluation showed a slightly elevated copper level.  MRI of the brain and cervical spine were unremarkable.  Felt to be from stress or fibromyalgia.  Has seen  oncology, CRP, ESR improved.   Today, she has done some research and thinks she has small fiber neuropathy. She feels burning all over her body, started in her feet. Has tingling in both uppers, worse on the left. Feels something is crawling under her skin, claims it started early 2021 when she was referred here. The burning and prickly are separate and both come and go. Feels someone sticking her with a pin all over, then electrical shocks all over. Feels this is getting worse. Claims these come in flares, when bad has to use a walker. The paresthesia symptoms is her main concerns. At times she feels she may fall, has to hold on. In 2018 had NCV left upper, had CTS surgery still problem. Claims does not have fibromyalgia.  Here today for evaluation unaccompanied.   REVIEW OF SYSTEMS: Full 14 system review of systems performed and negative with exception of: as per HPI.  ALLERGIES: Allergies  Allergen Reactions   Aspirin Palpitations and Other (See Comments)    Aspirin with caffeine only. Causes tachycardia Other reaction(s): tachycardia   Penicillin V Potassium     Other reaction(s): increased infections   Caffeine Palpitations    Other reaction(s): tachycardia   Iodinated Contrast Media Other (See Comments)    Hot flashes  Other reaction(s): flushing   Iodine-131 Other (See Comments)    flushing   Ioxaglate Other (See Comments)    Hot flashes    Penicillins Other (See Comments)    Makes have more infections  Has patient had a PCN reaction causing immediate rash, facial/tongue/throat swelling, SOB or lightheadedness with hypotension: No Has patient  had a PCN reaction causing severe rash involving mucus membranes or skin necrosis: No Has patient had a PCN reaction that required hospitalization: No Has patient had a PCN reaction occurring within the last 10 years:No If all of the above answers are "NO", then may proceed with Cephalosporin use.    Prednisone Other (See Comments)     Repeats infections Other reaction(s): increased sinus infections    HOME MEDICATIONS: Outpatient Medications Prior to Visit  Medication Sig Dispense Refill   Ascorbic Acid (VITAMIN C PO) Take 1 tablet by mouth daily.     b complex vitamins tablet Take 1 tablet by mouth daily.     Cholecalciferol (VITAMIN D3) 5000 UNITS CAPS Take 5,000 Units by mouth daily.      Digestive Enzymes (DIGESTIVE ENZYME PO) Take by mouth.     Menatetrenone (VITAMIN K2) 100 MCG TABS See admin instructions.     Probiotic Product (PROBIOTIC PO) Take 1 tablet by mouth daily.     TURMERIC PO Take 1,500 mg by mouth as needed.     UNABLE TO FIND Med Name: Curamin 1 tablet as needed $RemoveB'350mg'JXpQjCmD$      vitamin E 1000 UNIT capsule Take by mouth.     No facility-administered medications prior to visit.      PHYSICAL EXAM  GENERAL EXAM/CONSTITUTIONAL: Vitals:  Vitals:   09/25/22 1011  BP: (!) 142/85  Pulse: (!) 51  Weight: 226 lb (102.5 kg)  Height: $Remove'5\' 5"'IHudHzu$  (1.651 m)   Body mass index is 37.61 kg/m. Wt Readings from Last 3 Encounters:  09/25/22 226 lb (102.5 kg)  09/06/22 229 lb (103.9 kg)  02/18/22 220 lb (99.8 kg)   Patient is in no distress; well developed, nourished and groomed; neck is supple  CARDIOVASCULAR: Examination of carotid arteries is normal; no carotid bruits Regular rate and rhythm, no murmurs Examination of peripheral vascular system by observation and palpation is normal  EYES: Ophthalmoscopic exam of optic discs and posterior segments is normal; no papilledema or hemorrhages No results found.  MUSCULOSKELETAL: Gait, strength, tone, movements noted in Neurologic exam below  NEUROLOGIC: MENTAL STATUS:      No data to display         awake, alert, oriented to person, place and time recent and remote memory intact normal attention and concentration language fluent, comprehension intact, naming intact fund of knowledge appropriate  CRANIAL NERVE:  2nd - no papilledema on  fundoscopic exam 2nd, 3rd, 4th, 6th - pupils equal and reactive to light, visual fields full to confrontation, extraocular muscles intact, no nystagmus 5th - facial sensation symmetric 7th - facial strength symmetric 8th - hearing intact 9th - palate elevates symmetrically, uvula midline 11th - shoulder shrug symmetric 12th - tongue protrusion midline  MOTOR:  normal bulk and tone, full strength in the BUE, BLE  SENSORY:  normal and symmetric to light touch, temperature, vibration  COORDINATION:  finger-nose-finger, fine finger movements normal  REFLEXES:  deep tendon reflexes present and symmetric  GAIT/STATION:  narrow based gait     DIAGNOSTIC DATA (LABS, IMAGING, TESTING) - I reviewed patient records, labs, notes, testing and imaging myself where available.  Lab Results  Component Value Date   WBC 6.8 10/03/2021   HGB 14.1 10/03/2021   HCT 42.6 10/03/2021   MCV 88.8 10/03/2021   PLT 230 10/03/2021      Component Value Date/Time   NA 136 10/03/2021 0930   K 4.4 10/03/2021 0930   CL 102 10/03/2021 0930   CO2  22 10/03/2021 0930   GLUCOSE 64 (L) 10/03/2021 0930   BUN 7 10/03/2021 0930   CREATININE 0.79 10/03/2021 0930   CALCIUM 11.6 (H) 10/03/2021 0930   PROT 8.6 (H) 10/03/2021 0930   PROT 9.1 (H) 10/03/2021 0930   ALBUMIN 4.0 05/25/2021 1059   AST 24 10/03/2021 0930   ALT 17 10/03/2021 0930   ALKPHOS 112 05/25/2021 1059   BILITOT 1.2 10/03/2021 0930   GFRNONAA >60 05/25/2021 1059   GFRNONAA 88 08/20/2019 1211   GFRAA 102 08/20/2019 1211   No results found for: "CHOL", "HDL", "LDLCALC", "LDLDIRECT", "TRIG", "CHOLHDL" Lab Results  Component Value Date   HGBA1C 5.9 (H) 01/26/2022   Lab Results  Component Value Date   HEKBTCYE18 590 05/07/2020   No results found for: "TSH"   03/07/16 EMG/NCS ([facility]) This is a mildly abnormal study. There is mild chronic reinnervation limited to the triceps muscle. There is no electrodiagnostic evidence for  radiculopathy, myopathy or a polyneuropathy.  06/22/20 MRI brain Normal  06/22/20 MRI cervical ACDF C3-C7. No spinal stenosis or foraminal narrowing.   02/21/21 EMG/NCS (Dr. Jannifer Franklin) - Nerve conduction studies done on the left upper and left lower extremities were unremarkable, no evidence of a neuropathy is seen.  EMG evaluation of the left upper extremity was unremarkable, no evidence of an overlying cervical radiculopathy was seen.    ASSESSMENT AND PLAN  66 y.o. year old female here with:   Dx:  1. New onset headache   2. Vision changes   3. Numbness   4. Pain syndrome, chronic     PLAN:  HEADACHE / VISION CHANGES - suspect migraine; but since new onset in July 2023, and no prior migraine history, will check MRI brain to rule out other causes - then may consider migraine treatments (topiramate, rizatriptan)  NUMBNESS / PAIN - Unclear etiology in spite of extensive work-up at [facility]y, [facility] neurology, and [facility] with Dr. Jannifer Franklin in the past; considerations would include autoimmune condition, idiopathic small fiber neuropathy, stress reaction, hypersensitivity pain syndrome.  Orders Placed This Encounter  Procedures   MR [place]   Return for pending test results.    Penni Bombard, MD 93/10/2161, 44:69 AM Certified in Neurology, Neurophysiology and Neuroimaging  [facility] [address] [place], [place] [address] [phone]

## 2022-09-26 ENCOUNTER — Telehealth: Payer: Self-pay | Admitting: Diagnostic Neuroimaging

## 2022-09-26 DIAGNOSIS — R519 Headache, unspecified: Secondary | ICD-10-CM

## 2022-09-26 DIAGNOSIS — R200 Anesthesia of skin: Secondary | ICD-10-CM

## 2022-09-26 DIAGNOSIS — H539 Unspecified visual disturbance: Secondary | ICD-10-CM

## 2022-09-26 NOTE — Telephone Encounter (Signed)
medicare/BCBS sup NPR sent to GI [phone] 

## 2022-09-27 NOTE — Telephone Encounter (Signed)
Patient has iodine contrast allergy listed (used in CT scans).    She received MRI contrast in 2017 (06/10/16).  MRI contrast allergy is not listed.  Please verify with patient.   Penni Bombard, MD 34/96/1164, 3:53 PM Certified in Neurology, Neurophysiology and Neuroimaging  [facility] [address], [place], [place] [address] [phone]

## 2022-09-27 NOTE — Telephone Encounter (Signed)
The patient told [facility] that she is allergic to contrast and would like this to be changed to without contrast please.

## 2022-09-28 NOTE — Telephone Encounter (Signed)
I will let GI know the orders are changed and to call her again.

## 2022-09-28 NOTE — Addendum Note (Signed)
Addended by: Minna Antis on: 09/28/2022 08:45 AM   Modules accepted: Orders

## 2022-09-28 NOTE — Telephone Encounter (Signed)
Called patient and inquired about MRI contrast allergy. Advised per EMR she had MRI abdomen with contrast in 2017. She denied ever receiving contrast for that MRI. She stated her body has bad reactions to medications. She reported bad reaction with contrast from a myelogram. She is willing to have MRI brain without contrast. I advised will let Dr Leta Baptist know. She  verbalized understanding, appreciation.

## 2022-10-19 ENCOUNTER — Ambulatory Visit
Admission: RE | Admit: 2022-10-19 | Discharge: 2022-10-19 | Disposition: A | Discharge status: Home / Self Care | Payer: Medicare Other | Source: Ambulatory Visit | Attending: Diagnostic Neuroimaging | Admitting: Diagnostic Neuroimaging

## 2022-10-19 DIAGNOSIS — R200 Anesthesia of skin: Secondary | ICD-10-CM | POA: Diagnosis not present

## 2022-10-19 DIAGNOSIS — H539 Unspecified visual disturbance: Secondary | ICD-10-CM

## 2022-10-19 DIAGNOSIS — R519 Headache, unspecified: Secondary | ICD-10-CM

## 2022-10-24 ENCOUNTER — Telehealth: Payer: Self-pay | Admitting: Diagnostic Neuroimaging

## 2022-10-24 NOTE — Telephone Encounter (Signed)
Pt did call back and wanted to know specifically what Mild periventricular gliosis meant and if that is concerning.  Advised the patient that I would defer this question to Dr Leta Baptist when he reviews and provides [place] with official result note to discuss with her.   Informed her that with the impression stating normal MRI, I don't think this is anything that he is concerned with, but that I would reach back out to her after talking with Dr Leta Baptist. Pt was appreciative and had no further questions.

## 2022-10-24 NOTE — Telephone Encounter (Signed)
Called the patient back. There was no answer. Left a detailed message advising that appears that Dr Leta Baptist has read the MRI but had not sent the official report/result to [place] to call the patient. Advised that upon my reviewing his notes, the MRI appears to be a normal finding. There was nothing concerning stroke, tumor or bleeding on the brain.  Advised that when he send the official result, if there is anything else Dr. Leta Baptist adds, we will reach out to inform her but otherwise the MRI appeared normal. Advised the patient to call back with any questions that she may have.

## 2022-10-24 NOTE — Telephone Encounter (Signed)
Pt called wanting to know what the update is on her MRI results.

## 2022-10-25 ENCOUNTER — Encounter: Payer: Self-pay | Admitting: Neurology

## 2022-10-25 NOTE — Progress Notes (Unsigned)
 Office Visit Note  Patient: Meghan Owen             Date of Birth: 03/05/1956           MRN: [id number]             PCP: White, Cynthia, MD Referring: White, Cynthia, MD Visit Date: 10/26/2022 Occupation: @GUAROCC@  Subjective:  Pain in multiple joints  History of Present Illness: Meghan Owen is a 66 y.o. female history of palindromic rheumatism, osteoarthritis, degenerative disc disease and fibromyalgia syndrome.  She states she continues to have pain and discomfort in her bilateral shoulders, bilateral hands, bilateral knees and her feet.  She has discomfort in her entire back.  She also has flares from fibromyalgia with generalized pain and discomfort.  She has not noticed any joint swelling.  Needs to have dry mouth and dry eyes.  Activities of Daily Living:  Patient reports morning stiffness for a few minutes.   Patient Reports nocturnal pain.  Difficulty dressing/grooming: Denies Difficulty climbing stairs: Reports Difficulty getting out of chair: Reports Difficulty using hands for taps, buttons, cutlery, and/or writing: Denies  Review of Systems  Constitutional:  Positive for fatigue.  HENT:  Positive for mouth dryness. Negative for mouth sores.   Eyes:  Positive for dryness.  Respiratory:  Negative for shortness of breath.   Cardiovascular:  Positive for palpitations. Negative for chest pain.  Gastrointestinal:  Positive for constipation. Negative for blood in stool and diarrhea.  Endocrine: Positive for increased urination.  Genitourinary:  Negative for involuntary urination.  Musculoskeletal:  Positive for joint pain, gait problem, joint pain, myalgias, muscle weakness, morning stiffness, muscle tenderness and myalgias. Negative for joint swelling.  Skin:  Negative for color change, rash, hair loss and sensitivity to sunlight.  Allergic/Immunologic: Negative for susceptible to infections.  Neurological:  Positive for headaches. Negative for dizziness.   Hematological:  Negative for swollen glands.  Psychiatric/Behavioral:  Positive for sleep disturbance. Negative for depressed mood. The patient is not nervous/anxious.     PMFS History:  Patient Active Problem List   Diagnosis Date Noted   Elevated C-reactive protein (CRP) 06/01/2021   Paresthesia 01/31/2021   Osteoarthritis of both sacroiliac joints (HCC) 01/04/2018   Primary osteoarthritis of both knees 01/04/2018   DDD (degenerative disc disease), cervical 01/04/2018   DDD (degenerative disc disease), lumbar 01/04/2018   Chronic right shoulder pain 01/04/2018   Fibromyalgia 01/04/2018   Hashimoto's thyroiditis 01/04/2018   History of hyperparathyroidism 01/04/2018   History of gastroesophageal reflux (GERD) 01/04/2018   Family history of rheumatoid arthritis 01/04/2018   Cervical myelopathy (HCC) 09/05/2016   Primary osteoarthritis of left hip 01/04/2016   Chronic left hip pain 12/15/2014   Hypercalcemia 09/08/2013   Varus deformity of foot 08/26/2013   Peroneal tendonitis 05/23/2013   Conversion disorder 05/21/2013   Low back pain 11/01/2012   Achilles tendinitis 09/05/2012   Bilateral ankle pain 09/05/2012   Hammer toe, acquired 09/05/2012   Cubital tunnel syndrome on left 08/22/2012   Stress fracture 07/04/2012   Lumbar facet arthropathy 05/10/2012   Diffuse myofascial pain syndrome 01/25/2012   GERD 02/05/2009   OTHER BURSITIS DISORDERS 02/05/2009   ABNORMAL ELECTROCARDIOGRAM 02/05/2009    Past Medical History:  Diagnosis Date   Arthritis    Bursitis, hip    bilateral   DDD (degenerative disc disease), lumbar    Dyslipidemia    Elevated sed rate    Fatty liver    Fibromyalgia      GERD (gastroesophageal reflux disease)    HX   Hashimoto's thyroiditis    Headache    Hypercalcemia    Hyperparathyroidism (HCC)    Dr. Altheimer   Hypothyroidism    Liver cyst    BENIGN   Obesity    Osteoarthritis    Parathyroid disorder (HCC)    HYPERPARATHYROID    Paresthesia    Peripheral neuropathy    Spinal stenosis    Synovial cyst of elbow    Thyroid disease    Tremor    Vitamin D deficiency     Family History  Problem Relation Age of Onset   Lupus Mother    Rheum arthritis Mother    Dementia Father    Leukemia Father    Cancer Sister        bone    Healthy Daughter    Healthy Son    Healthy Son    Cancer Other    Past Surgical History:  Procedure Laterality Date   ABDOMINAL HYSTERECTOMY     ANKLE SURGERY     ANTERIOR CERVICAL DECOMPRESSION/DISCECTOMY FUSION 4 LEVELS N/A 09/05/2016   Procedure: Cervical three-four Cervical four-five  Cervical five-six Cervical six-seven  Anterior cervical decompression/diskectomy/fusion;  Surgeon: Joseph Stern, MD;  Location: [facility];  Service: Neurosurgery;  Laterality: N/A;   CARPAL TUNNEL RELEASE Left 02/26/2018   ELBOW SURGERY  04/2017   cyst removed    FOOT SURGERY     TOE SURGERY Bilateral    Social History   Social History Narrative   Caffeine yes, widowed.  3 children.     There is no immunization history on file for this patient.   Objective: Vital Signs: BP (!) 152/84 (BP Location: Left Arm, Patient Position: Sitting, Cuff Size: Large)   Pulse (!) 58   Resp 16   Ht 5' 5" (1.651 m)   Wt 229 lb 3.2 oz (104 kg)   BMI 38.14 kg/m    Physical Exam Vitals and nursing note reviewed.  Constitutional:      Appearance: She is well-developed.  HENT:     Head: Normocephalic and atraumatic.  Eyes:     Conjunctiva/sclera: Conjunctivae normal.  Cardiovascular:     Rate and Rhythm: Normal rate and regular rhythm.     Heart sounds: Normal heart sounds.  Pulmonary:     Effort: Pulmonary effort is normal.     Breath sounds: Normal breath sounds.  Abdominal:     General: Bowel sounds are normal.     Palpations: Abdomen is soft.  Musculoskeletal:     Cervical back: Normal range of motion.  Lymphadenopathy:     Cervical: No cervical adenopathy.  Skin:    General: Skin is  warm and dry.     Capillary Refill: Capillary refill takes less than 2 seconds.  Neurological:     Mental Status: She is alert and oriented to person, place, and time.  Psychiatric:        Behavior: Behavior normal.      Musculoskeletal Exam: She had limited range of motion of the cervical and lumbar spine.  Shoulder joints elbow joints, wrist joints, MCPs PIPs and DIPs been good range of motion with no synovitis.  She had discomfort range of motion of her left hip joint.  Knee joints with good range of motion without any warmth swelling or effusion.  There was no tenderness over ankles or MTPs.  She had generalized hyperalgesia and positive tender points.  CDAI Exam: CDAI Score: --   Patient Global: --; Provider Global: -- Swollen: --; Tender: -- Joint Exam 10/26/2022   No joint exam has been documented for this visit   There is currently no information documented on the homunculus. Go to the Rheumatology activity and complete the homunculus joint exam.  Investigation: No additional findings.  Imaging: MR BRAIN WO CONTRAST  Result Date: 10/20/2022 Table formatting from the original result was not included. [facility] NEURO[facility] REPORT STUDY DATE: 10/19/22 PATIENT NAME: ANAISHA MAGO DOB: 1956/01/12 MRN: [id number] ORDERING CLINICIAN: Penni Bombard, MD CLINICAL HISTORY: 66 y.o. year old female with: 1. New onset headache  2. Vision changes  3. Numbness   EXAM: MR BRAIN WO CONTRAST TECHNIQUE: MRI of the brain without contrast was obtained utilizing multiplanar, multiecho pulse sequences. CONTRAST: Diagnostic Product Medications (last 72 hours)   None   COMPARISON: 06/22/20 MRI IMAGING SITE: [facility] [place] [id number] FINDINGS: No abnormal lesions are seen on diffusion-weighted views to suggest acute ischemia. The cortical sulci, fissures and cisterns are normal in size and appearance.  Lateral, third and fourth ventricle are normal in size and appearance. No extra-axial fluid collections are seen. No evidence of mass effect or midline shift. Mild periventricular gliosis. On sagittal views the posterior fossa, pituitary gland and corpus callosum are unremarkable. No evidence of intracranial hemorrhage on SWI views. The orbits and their contents, paranasal sinuses and calvarium are unremarkable.  Intracranial flow voids are present.   Normal MRI brain (without). INTERPRETING PHYSICIAN: Penni Bombard, MD Certified in Neurology, Neurophysiology and Neuroimaging [facility] [address] [place], [place] [address] [phone]   Recent Labs: Lab Results  Component Value Date   WBC 6.8 10/03/2021   HGB 14.1 10/03/2021   PLT 230 10/03/2021   NA 136 10/03/2021   K 4.4 10/03/2021   CL 102 10/03/2021   CO2 22 10/03/2021   GLUCOSE 64 (L) 10/03/2021   BUN 7 10/03/2021   CREATININE 0.79 10/03/2021   BILITOT 1.2 10/03/2021   ALKPHOS 112 05/25/2021   AST 24 10/03/2021   ALT 17 10/03/2021   PROT 8.6 (H) 10/03/2021   PROT 9.1 (H) 10/03/2021   ALBUMIN 4.0 05/25/2021   CALCIUM 11.6 (H) 10/03/2021   GFRAA 102 08/20/2019     Speciality Comments: No specialty comments available.  Procedures:  No procedures performed Allergies: Aspirin, Penicillin v potassium, Caffeine, Iodinated contrast media, Iodine-131, Ioxaglate, Penicillins, and Prednisone   Assessment / Plan:     Visit Diagnoses: Palindromic rheumatism - 10/27/21: AVISE lupus index -2.7, ANA 1: 80NS, antithyroglobulin positive, antithyroid peroxidase positive:September 12, 2022 AVISE Lupus index -2.7, ANA negative, ENA negative, CB Negative, anticardiolipin negative, beta-2 GP 1 negative, phosphatidylserine negative, rheumatoid factor negative, anti-CCP negative, anti-carP positive, antihistone negative, anti-TPO positive, antithyroglobulin positive.  Lab results were discussed with the patient at  length.  Association of anticarpe antibody with increased risk of rheumatoid arthritis was discussed.  Patient had no synovitis on examination.  She denies any history of joint swelling.  She continues to have arthralgias and myalgias consistent with osteoarthritis and fibromyalgia syndrome.  I advised her to contact me if she develops any increased swelling.  We discussed possible use of hydroxychloroquine in the future if she develops any joint swelling.  Pain in both hands -she continues to have discomfort in the bilateral hands.  No synovitis was noted.  X-rays of both hands consistent with early osteoarthritis on 03/28/21.  Primary osteoarthritis of both knees -she complains of chronic discomfort in her knee joints.  Previous x-rays showed left knee joint mild osteoarthritis and severe chondromalacia patella and right knee joint and osteoarthritis and chondromalacia patella.  Lower extremity muscle strengthening exercises were discussed.  Pain in both feet -she complains of discomfort in her feet.  Proper fitting shoes were advised.  X-rays of both feet consistent with osteoarthritis on 03/28/21.  Fibromyalgia-she has generalized pain and discomfort from fibromyalgia.  She had intolerance to Cymbalta in the past.  Need for regular exercise stretching and good sleep hygiene was discussed.  DDD (degenerative disc disease), cervical -she had limited lateral rotation of the cervical spine.  s/p fusion-Dr. Stern 2017: EMG on 02/21/21: evaluation of the left upper extremity was unremarkable.  DDD (degenerative disc disease), lumbar -she complains of chronic discomfort in her lumbar spine.  X-rays of the lumbar spine were consistent with facet joint arthropathy and mild dextroscoliosis. MRI of lumbar spine performed on 12/20/20.  Osteoarthritis of both sacroiliac joints (HCC) - HLA-B27 negative, MRI negative for sacroiliitis. MRI on 01/01/2018 revealed chronic fusion of superior aspect of right SI  joint.  Dry eyes-she has dry eyes and dry mouth which she relates to medication use.  Dry mouth-most likely related to medication use.  Family history of rheumatoid arthritis - mother.  Other medical problems are listed as follows:  Elevated sed rate-she has chronic elevation of sedimentation rate.  Her sed rate has been elevated for many years.  Other fatigue-most likely related to Ro myalgia.  Hashimoto's thyroiditis - Followed by Dr. Kerr.  Positive antithyroglobulin and positive antithyroid peroxidase antibodies.  History of gastroesophageal reflux (GERD)  History of hyperparathyroidism - She is followed by Dr. Kerr.  Orders: No orders of the defined types were placed in this encounter.  No orders of the defined types were placed in this encounter.    Follow-Up Instructions: Return in about 6 months (around 04/26/2023) for palindromic rheumatism, OA, FMS.  Patient was advised to contact us if she develops any increased joint swelling.   Shaili Deveshwar, MD  Note - This record has been created using Dragon software.  Chart creation errors have been sought, but may not always  have been located. Such creation errors do not reflect on  the standard of medical care.  

## 2022-10-26 ENCOUNTER — Ambulatory Visit: Payer: Medicare Other | Attending: Rheumatology | Admitting: Rheumatology

## 2022-10-26 ENCOUNTER — Encounter: Payer: Self-pay | Admitting: Rheumatology

## 2022-10-26 VITALS — BP 152/84 | HR 58 | Resp 16 | Ht 65.0 in | Wt 229.2 lb

## 2022-10-26 DIAGNOSIS — M79671 Pain in right foot: Secondary | ICD-10-CM | POA: Insufficient documentation

## 2022-10-26 DIAGNOSIS — M461 Sacroiliitis, not elsewhere classified: Secondary | ICD-10-CM | POA: Diagnosis present

## 2022-10-26 DIAGNOSIS — M797 Fibromyalgia: Secondary | ICD-10-CM | POA: Insufficient documentation

## 2022-10-26 DIAGNOSIS — R682 Dry mouth, unspecified: Secondary | ICD-10-CM | POA: Diagnosis present

## 2022-10-26 DIAGNOSIS — M79672 Pain in left foot: Secondary | ICD-10-CM | POA: Diagnosis present

## 2022-10-26 DIAGNOSIS — M79641 Pain in right hand: Secondary | ICD-10-CM | POA: Diagnosis not present

## 2022-10-26 DIAGNOSIS — R5383 Other fatigue: Secondary | ICD-10-CM | POA: Diagnosis present

## 2022-10-26 DIAGNOSIS — M5030 Other cervical disc degeneration, unspecified cervical region: Secondary | ICD-10-CM | POA: Diagnosis present

## 2022-10-26 DIAGNOSIS — M1230 Palindromic rheumatism, unspecified site: Secondary | ICD-10-CM | POA: Insufficient documentation

## 2022-10-26 DIAGNOSIS — M79642 Pain in left hand: Secondary | ICD-10-CM | POA: Insufficient documentation

## 2022-10-26 DIAGNOSIS — Z8261 Family history of arthritis: Secondary | ICD-10-CM | POA: Insufficient documentation

## 2022-10-26 DIAGNOSIS — M5136 Other intervertebral disc degeneration, lumbar region: Secondary | ICD-10-CM | POA: Insufficient documentation

## 2022-10-26 DIAGNOSIS — M170 Bilateral primary osteoarthritis of knee: Secondary | ICD-10-CM | POA: Insufficient documentation

## 2022-10-26 DIAGNOSIS — H04123 Dry eye syndrome of bilateral lacrimal glands: Secondary | ICD-10-CM | POA: Diagnosis present

## 2022-10-26 DIAGNOSIS — R700 Elevated erythrocyte sedimentation rate: Secondary | ICD-10-CM | POA: Insufficient documentation

## 2022-10-26 DIAGNOSIS — E063 Autoimmune thyroiditis: Secondary | ICD-10-CM | POA: Diagnosis present

## 2022-10-26 DIAGNOSIS — Z8639 Personal history of other endocrine, nutritional and metabolic disease: Secondary | ICD-10-CM | POA: Insufficient documentation

## 2022-10-26 DIAGNOSIS — Z8719 Personal history of other diseases of the digestive system: Secondary | ICD-10-CM | POA: Diagnosis present

## 2022-11-07 ENCOUNTER — Encounter: Payer: Self-pay | Admitting: Rheumatology

## 2023-01-30 ENCOUNTER — Ambulatory Visit (INDEPENDENT_AMBULATORY_CARE_PROVIDER_SITE_OTHER): Payer: Medicare Other

## 2023-01-30 ENCOUNTER — Ambulatory Visit (INDEPENDENT_AMBULATORY_CARE_PROVIDER_SITE_OTHER): Payer: Medicare Other | Admitting: Podiatry

## 2023-01-30 ENCOUNTER — Encounter: Payer: Self-pay | Admitting: Podiatry

## 2023-01-30 DIAGNOSIS — G5791 Unspecified mononeuropathy of right lower limb: Secondary | ICD-10-CM | POA: Diagnosis not present

## 2023-01-30 DIAGNOSIS — M2170 Unequal limb length (acquired), unspecified site: Secondary | ICD-10-CM | POA: Diagnosis not present

## 2023-01-30 DIAGNOSIS — M79671 Pain in right foot: Secondary | ICD-10-CM

## 2023-01-30 DIAGNOSIS — Q6671 Congenital pes cavus, right foot: Secondary | ICD-10-CM

## 2023-01-30 DIAGNOSIS — M79672 Pain in left foot: Secondary | ICD-10-CM

## 2023-01-30 DIAGNOSIS — G5792 Unspecified mononeuropathy of left lower limb: Secondary | ICD-10-CM

## 2023-01-30 DIAGNOSIS — Q6672 Congenital pes cavus, left foot: Secondary | ICD-10-CM

## 2023-01-30 NOTE — Progress Notes (Signed)
  Subjective:  Patient ID: Meghan Owen, female    DOB: 04-18-1956,   MRN: [id number]  Chief Complaint  Patient presents with   Foot Pain    Bilateral foot pain , right foot is worse than left.     67 y.o. female presents for concern of bilateral foot pain. Relates the right is worse than the left. States a history of pes cavus reconstruction in 2014. Relates she has recently been getting more burning pain in the foot and ankle on the top and side that is worse when ambulating. Does relates some lower back pain and has burning and tingling in both feet but mostly on the left side she thinks is coming from her back. Denies any current treatments.   . Denies any other pedal complaints. Denies n/v/f/c.   Past Medical History:  Diagnosis Date   Arthritis    Bursitis, hip    bilateral   DDD (degenerative disc disease), lumbar    Dyslipidemia    Elevated sed rate    Fatty liver    Fibromyalgia    GERD (gastroesophageal reflux disease)    HX   Hashimoto's thyroiditis    Headache    Hypercalcemia    Hyperparathyroidism ([place])    Dr. Elyse Hsu   Hypothyroidism    Liver cyst    BENIGN   Obesity    Osteoarthritis    Parathyroid disorder ([place])    HYPERPARATHYROID   Paresthesia    Peripheral neuropathy    Spinal stenosis    Synovial cyst of elbow    Thyroid disease    Tremor    Vitamin D deficiency     Objective:  Physical Exam: Vascular: DP/PT pulses 2/4 bilateral. CFT <3 seconds. Normal hair growth on digits. No edema.  Skin. No lacerations or abrasions bilateral feet.  Musculoskeletal: MMT 5/5 bilateral lower extremities in DF, PF, Inversion and Eversion. Deceased ROM in DF of ankle joint. No tenderness to palpation about the either foot today. Negative tinels signs. No pain with Rom of MMT. Neurological: Sensation intact to light touch.   Assessment:   1. Neuritis of right foot   2. Pain in both feet   3. Neuritis of left foot      Plan:  Patient was evaluated and  treated and all questions answered. X-rays reviewed and discussed with patient. No acute fractures or dislocations noted. Retianed hardware in second metatrsal on right and in calcaneas on right. Left fifth digit with varus rotation at DIPJ.  Discussed neuritis vs mechanical problem and etiology as well as treatment with patient.  Radiographs reviewed and discussed with patient.  -Discussed and educated patient on diabetic foot care, especially with  regards to the vascular, neurological and musculoskeletal systems.  -Discussed supportive shoes at all times and checking feet regularly.  -Discussed potentially trying gabapentin but patient would like to avoid medications.  Discussed custom orthotics. [facility] prescription provided.  NCV studies orders to recheck nerves and possible neuritis in this right foot.  -Patient to return in 3 months for follow-up evaluation.     Lorenda Peck, DPM

## 2023-03-07 ENCOUNTER — Other Ambulatory Visit (HOSPITAL_COMMUNITY): Payer: BLUE CROSS/BLUE SHIELD

## 2023-03-14 ENCOUNTER — Ambulatory Visit: Payer: BLUE CROSS/BLUE SHIELD | Admitting: Rheumatology

## 2023-03-29 ENCOUNTER — Encounter (HOSPITAL_COMMUNITY)
Admission: RE | Admit: 2023-03-29 | Discharge: 2023-03-29 | Disposition: A | Discharge status: Home / Self Care | Payer: Medicare Other | Source: Ambulatory Visit | Attending: Internal Medicine | Admitting: Internal Medicine

## 2023-03-29 DIAGNOSIS — E210 Primary hyperparathyroidism: Secondary | ICD-10-CM | POA: Insufficient documentation

## 2023-03-29 MED ORDER — TECHNETIUM TC 99M SESTAMIBI - CARDIOLITE
23.8000 | Freq: Once | INTRAVENOUS | Status: AC | PRN
  Administered 2023-03-29: 23.8 via INTRAVENOUS

## 2023-04-02 ENCOUNTER — Telehealth: Payer: Self-pay | Admitting: *Deleted

## 2023-04-02 ENCOUNTER — Other Ambulatory Visit (HOSPITAL_COMMUNITY): Payer: Self-pay | Admitting: Surgery

## 2023-04-02 DIAGNOSIS — E210 Primary hyperparathyroidism: Secondary | ICD-10-CM

## 2023-04-02 MED ORDER — ONABOTULINUMTOXINA 100 UNITS IJ SOLR
INTRAMUSCULAR | Status: AC
  Filled 2023-04-02: qty 100

## 2023-04-02 MED ORDER — SODIUM CHLORIDE (PF) 0.9 % IJ SOLN
INTRAMUSCULAR | Status: AC
  Filled 2023-04-02: qty 10

## 2023-04-02 NOTE — Telephone Encounter (Signed)
Thank you :)

## 2023-04-02 NOTE — Telephone Encounter (Addendum)
Patient is calling for the status of her NCV appointment,recently seen in MyChart scheduled but now has been cancelled, called [facility], left message for call back for explanation, updated patient and explained to her that they probably cancelled and sent to [facility] since she is a patient her. [facility] said that the referral to them had been denied because the patient is an established patient the [facility] and that Dr Tat had nothing else to offer the patient. She was also referred  to pain management w/ Dr Penelope Galas but patient declined.updated this with patient, verbalized understanding and she wants the referral(NCV) sent to [facility]. Referral has been sent to [facility].

## 2023-04-05 ENCOUNTER — Other Ambulatory Visit (HOSPITAL_COMMUNITY): Payer: BLUE CROSS/BLUE SHIELD

## 2023-04-10 ENCOUNTER — Other Ambulatory Visit (HOSPITAL_COMMUNITY): Payer: Self-pay | Admitting: Surgery

## 2023-04-10 ENCOUNTER — Ambulatory Visit (HOSPITAL_COMMUNITY)
Admission: RE | Admit: 2023-04-10 | Discharge: 2023-04-10 | Disposition: A | Discharge status: Home / Self Care | Payer: Medicare Other | Source: Ambulatory Visit | Attending: Surgery | Admitting: Surgery

## 2023-04-10 DIAGNOSIS — E210 Primary hyperparathyroidism: Secondary | ICD-10-CM | POA: Insufficient documentation

## 2023-04-10 NOTE — Progress Notes (Signed)
USN does not identify a parathyroid adenoma.  Sestamibi was also negative.  [facility] - please order a 4D-CT scan of the neck with parathyroid protocol.  Will contact patient with result when available.  tmg  Darnell Level, MD [facility] practice Office: [phone]

## 2023-04-13 NOTE — Progress Notes (Signed)
Office Visit Note  Patient: Meghan Owen             Date of Birth: 1956-07-09           MRN: [id number]             PCP: Laurann Montana, MD Referring: Laurann Montana, MD Visit Date: 04/27/2023 Occupation: @GUAROCC @  Subjective:  Pain in multiple joints  History of Present Illness: ILEE Owen is a 67 y.o. female with history of palindromic rheumatism, osteoarthritis, degenerative disc disease and fibromyalgia syndrome.  She returns today after her last visit in November 2023.  She states she has had 2 flares since then.  The last flare was about 3 weeks ago which was quite severe.  She states she was having pain and discomfort in her bilateral hands, bilateral knee joints and her feet.  She states her knees appeared swollen.  She brought pictures on her cell phone of her bilateral feet swelling.  She continues to have some generalized pain and discomfort from fibromyalgia.  She also has discomfort in her neck and lower back due to underlying disc disease.  She has dry mouth and dry eyes which persists.    Activities of Daily Living:  Patient reports morning stiffness for several hours.   Patient Reports nocturnal pain.  Difficulty dressing/grooming: Denies Difficulty climbing stairs: Reports Difficulty getting out of chair: Reports Difficulty using hands for taps, buttons, cutlery, and/or writing: Denies  Review of Systems  Constitutional:  Positive for fatigue.  HENT:  Positive for mouth dryness. Negative for mouth sores.   Eyes:  Positive for dryness.  Respiratory:  Negative for shortness of breath.   Cardiovascular:  Negative for chest pain and palpitations.  Gastrointestinal:  Negative for blood in stool, constipation and diarrhea.  Endocrine: Positive for increased urination.  Genitourinary:  Negative for involuntary urination.  Musculoskeletal:  Positive for joint pain, gait problem, joint pain, myalgias, muscle weakness, morning stiffness and myalgias. Negative for  joint swelling and muscle tenderness.  Skin:  Positive for color change, hair loss and sensitivity to sunlight. Negative for rash.  Allergic/Immunologic: Negative for susceptible to infections.  Neurological:  Positive for dizziness and headaches.  Hematological:  Negative for swollen glands.  Psychiatric/Behavioral:  Positive for sleep disturbance. Negative for depressed mood. The patient is not nervous/anxious.     PMFS History:  Patient Active Problem List   Diagnosis Date Noted   Elevated C-reactive protein (CRP) 06/01/2021   Paresthesia 01/31/2021   Osteoarthritis of both sacroiliac joints (HCC) 01/04/2018   Primary osteoarthritis of both knees 01/04/2018   DDD (degenerative disc disease), cervical 01/04/2018   DDD (degenerative disc disease), lumbar 01/04/2018   Chronic right shoulder pain 01/04/2018   Fibromyalgia 01/04/2018   Hashimoto's thyroiditis 01/04/2018   History of hyperparathyroidism 01/04/2018   History of gastroesophageal reflux (GERD) 01/04/2018   Family history of rheumatoid arthritis 01/04/2018   Cervical myelopathy (HCC) 09/05/2016   Primary osteoarthritis of left hip 01/04/2016   Chronic left hip pain 12/15/2014   Hypercalcemia 09/08/2013   Varus deformity of foot 08/26/2013   Peroneal tendonitis 05/23/2013   Conversion disorder 05/21/2013   Low back pain 11/01/2012   Achilles tendinitis 09/05/2012   Bilateral ankle pain 09/05/2012   Hammer toe, acquired 09/05/2012   Cubital tunnel syndrome on left 08/22/2012   Stress fracture 07/04/2012   Lumbar facet arthropathy 05/10/2012   Diffuse myofascial pain syndrome 01/25/2012   GERD 02/05/2009   OTHER BURSITIS DISORDERS 02/05/2009  ABNORMAL ELECTROCARDIOGRAM 02/05/2009    Past Medical History:  Diagnosis Date   Arthritis    Bursitis, hip    bilateral   DDD (degenerative disc disease), lumbar    Dyslipidemia    Elevated sed rate    Fatty liver    Fibromyalgia    GERD (gastroesophageal reflux  disease)    HX   Hashimoto's thyroiditis    Headache    Hypercalcemia    Hyperparathyroidism (HCC)    Dr. Leslie Dales   Hypothyroidism    Liver cyst    BENIGN   Obesity    Osteoarthritis    Parathyroid disorder (HCC)    HYPERPARATHYROID   Paresthesia    Peripheral neuropathy    Spinal stenosis    Synovial cyst of elbow    Thyroid disease    Tremor    Vitamin D deficiency     Family History  Problem Relation Age of Onset   Lupus Mother    Rheum arthritis Mother    Dementia Father    Leukemia Father    Cancer Sister        bone    Healthy Daughter    Healthy Son    Healthy Son    Cancer Other    Past Surgical History:  Procedure Laterality Date   ABDOMINAL HYSTERECTOMY     ANKLE SURGERY     ANTERIOR CERVICAL DECOMPRESSION/DISCECTOMY FUSION 4 LEVELS N/A 09/05/2016   Procedure: Cervical three-four Cervical four-five  Cervical five-six Cervical six-seven  Anterior cervical decompression/diskectomy/fusion;  Surgeon: Maeola Harman, MD;  Location: [facility];  Service: Neurosurgery;  Laterality: N/A;   CARPAL TUNNEL RELEASE Left 02/26/2018   ELBOW SURGERY  04/2017   cyst removed    FOOT SURGERY     TOE SURGERY Bilateral    Social History   Social History Narrative   Caffeine yes, widowed.  3 children.     There is no immunization history on file for this patient.   Objective: Vital Signs: BP (!) 148/92 (BP Location: Left Arm, Patient Position: Sitting, Cuff Size: Large)   Pulse 69   Resp 16   Ht 5\' 5"  (1.651 m)   Wt 238 lb (108 kg)   BMI 39.61 kg/m    Physical Exam Vitals and nursing note reviewed.  Constitutional:      Appearance: She is well-developed.  HENT:     Head: Normocephalic and atraumatic.  Eyes:     Conjunctiva/sclera: Conjunctivae normal.  Cardiovascular:     Rate and Rhythm: Normal rate and regular rhythm.     Heart sounds: Normal heart sounds.  Pulmonary:     Effort: Pulmonary effort is normal.     Breath sounds: Normal breath sounds.   Abdominal:     General: Bowel sounds are normal.     Palpations: Abdomen is soft.  Musculoskeletal:     Cervical back: Normal range of motion.  Lymphadenopathy:     Cervical: No cervical adenopathy.  Skin:    General: Skin is warm and dry.     Capillary Refill: Capillary refill takes less than 2 seconds.  Neurological:     Mental Status: She is alert and oriented to person, place, and time.  Psychiatric:        Behavior: Behavior normal.      Musculoskeletal Exam: She had limited lateral rotation of the cervical spine.  She had discomfort range of motion of her lumbar spine.  Shoulder joints, elbow joints, wrist joints, MCPs PIPs and DIPs  been good range of motion with no synovitis.  Hip joints and knee joints in good range of motion without any warmth swelling or effusion.  There is no tenderness over ankles or MTPs.  CDAI Exam: CDAI Score: -- Patient Global: --; Provider Global: -- Swollen: --; Tender: -- Joint Exam 04/27/2023   No joint exam has been documented for this visit   There is currently no information documented on the homunculus. Go to the Rheumatology activity and complete the homunculus joint exam.  Investigation: No additional findings.  Imaging: US THYROID  Result Date: 04/10/2023 CLINICAL DATA:  67 year old female with hyperparathyroidism. EXAM: THYROID ULTRASOUND TECHNIQUE: Ultrasound examination of the thyroid gland and adjacent soft tissues was performed. COMPARISON:  08/22/2018 FINDINGS: Parenchymal Echotexture: Moderately heterogeneous, no glandular hyperemia Isthmus: 0.5 cm ,previously 0.5 cm Right lobe: 5.2 x 2.1 x 2.4 cm ,previously 5.2 x 1.9 x 2.0 cm Left lobe: 4.6 x 1.8 x 2.0 cm ,previously 4.1 x 1.7 x 1.9 cm ________________________________________________________ Estimated total number of nodules >/= 1 cm: 0 Number of spongiform nodules >/=  2 cm not described below (TR1): 0 Number of mixed cystic and solid nodules >/= 1.5 cm not described below  (TR2): 0 _________________________________________________________ Nodule # 1: Location: Right; Superior Maximum size: 0.6 cm; Other 2 dimensions: 0.5 x 0.6 cm Composition: mixed cystic and solid (1) Echogenicity: isoechoic (1) Shape: not taller-than-wide (0) Margins: smooth (0) Echogenic foci: none (0) ACR TI-RADS total points: 2. ACR TI-RADS risk category: TR2 (2 points). ACR TI-RADS recommendations: This nodule does NOT meet TI-RADS criteria for biopsy or dedicated follow-up. _________________________________________________________ Similar appearing 0.5 cm hypoechoic focus just posterior to the right inferior lobe of the thyroid, again suspected to represent parathyroid gland. Nodule # 3: Prior biopsy: No Location: Left; Inferior Maximum size: 0.9 cm; Other 2 dimensions: 0.5 x 0.7 cm, previously, 0.8 x 0.6 x 0.6 cm Composition: solid/almost completely solid (2) Echogenicity: hypoechoic (2) Shape: not taller-than-wide (0) Margins: smooth (0) Echogenic foci: none (0) ACR TI-RADS total points: 4. ACR TI-RADS risk category:  TR4 (4-6 points). Significant change in size (>/= 20% in two dimensions and minimal increase of 2 mm): No Change in features: No Change in ACR TI-RADS risk category: No ACR TI-RADS recommendations: Given size (<1.0 cm) And appearance, this nodule does NOT meet TI-RADS criteria for biopsy or dedicated follow-up. _________________________________________________________ No cervical lymphadenopathy. IMPRESSION: 1. Similar appearing moderately heterogeneous thyroid gland without glandular hyperemia. These are nonspecific findings but could be seen in chronic stages of thyroiditis or autoimmune thyroid disorder. 2. Similar appearing subcentimeter, benign-appearing nodules about the posterior bilateral inferior poles, which again could conceivably represent parathyroid glands. 3. New, benign-appearing (TI-RADS category 2) right upper lobe solid-cystic thyroid nodule (labeled 1, 0.6 cm). This nodule  does not meet criteria for follow-up. The above is in keeping with the ACR TI-RADS recommendations - J Am Coll Radiol 2017;14:587-595. Marliss Coots, MD [facility] Radiology Electronically Signed   By: Marliss Coots M.D.   On: 04/10/2023 08:52   NM Parathyroid W/Spect  Result Date: 04/02/2023 CLINICAL DATA:  Primary hyperparathyroidism. EXAM: NM PARATHYROID SCINTIGRAPHY AND SPECT IMAGING TECHNIQUE: Following intravenous administration of radiopharmaceutical, early and 2-hour delayed planar images were obtained in the anterior projection. Delayed triplanar SPECT images were also obtained at 2 hours. RADIOPHARMACEUTICALS:  23.8 mCi Tc-39m Sestamibi IV COMPARISON:  None Available. FINDINGS: Normal washout of activity from the thyroid bed at the 2 hour planar imaging. No focality identified within the thyroid  bed. SPECT imaging of the neck at 2 hours imaging demonstrates faint activity within the thyroid gland without focal lesion identified. IMPRESSION: No parathyroid adenoma identified on planar or SPECT imaging. Electronically Signed   By: Genevive Bi M.D.   On: 04/02/2023 09:16    Recent Labs: Lab Results  Component Value Date   WBC 6.8 10/03/2021   HGB 14.1 10/03/2021   PLT 230 10/03/2021   NA 136 10/03/2021   K 4.4 10/03/2021   CL 102 10/03/2021   CO2 22 10/03/2021   GLUCOSE 64 (L) 10/03/2021   BUN 7 10/03/2021   CREATININE 0.79 10/03/2021   BILITOT 1.2 10/03/2021   ALKPHOS 112 05/25/2021   AST 24 10/03/2021   ALT 17 10/03/2021   PROT 8.6 (H) 10/03/2021   PROT 9.1 (H) 10/03/2021   ALBUMIN 4.0 05/25/2021   CALCIUM 11.6 (H) 10/03/2021   GFRAA 102 08/20/2019    Speciality Comments: No specialty comments available.  Procedures:  No procedures performed Allergies: Aspirin, Penicillin v potassium, Caffeine, Iodinated contrast media, Iodine-131, Ioxaglate, Penicillins, and Prednisone   Assessment / Plan:     Visit Diagnoses:  Palindromic rheumatism - September 12, 2022 AVISE Lupus index -2.7. anti-carP positive, antihistone negative, anti-TPO positive, antithyroglobulin positive.  Patient has had 2 flares of arthritis since her last visit.  She states the last flare was about 3 weeks ago which lasted for a week and was quite severe.  She brought pictures of her swollen feet on her cell phone.  She states her knee joints are warm to touch.  The symptoms gradually subsided.  We had a detailed discussion regarding use of Plaquenil in the past and we discussed again.  Indications side effects contraindications were discussed at length.  Patient would like to hold off Plaquenil for now as she is believes that she will need parathyroidectomy.  She will let us know when she is ready.  A handout on Plaquenil was again given and side effects were reviewed.  Pain in both hands -patient states she was having increased pain and discomfort in her bilateral hands and wrist joints about 3 weeks ago with some swelling.  No synovitis was noted today.  X-rays of both hands consistent with early osteoarthritis on 03/28/21.  Primary osteoarthritis of both knees -she had pain and discomfort in the bilateral knee joints with warmth and swelling about 3 weeks ago per patient.  She had no warmth or swelling on the examination today.  She has underlying osteoarthritis which causes some discomfort.  Previous x-rays showed left knee joint mild osteoarthritis and severe chondromalacia patella and right knee joint and osteoarthritis and chondromalacia patella.  Pain in both feet -she brought pictures of her bilateral feet swelling on her cell phone.  I could appreciate swelling in her bilateral feet on the pictures.  Patient would like to hold off hydroxychloroquine at this point.  X-rays of both feet consistent with osteoarthritis on 03/28/21.  DDD (degenerative disc disease), cervical -she has limited range of motion of the cervical spine.  S/p fusion-Dr. Venetia Maxon  2017: EMG on 02/21/21: evaluation of the left upper extremity was unremarkable.  DDD (degenerative disc disease), lumbar -she has intermittent discomfort in her lower back.  She discomfort range of motion.  X-rays of the lumbar spine were consistent with facet joint arthropathy and mild dextroscoliosis. MRI of lumbar spine performed on 12/20/20.  Osteoarthritis of both sacroiliac joints (HCC) - HLA-B27 negative, MRI negative for sacroiliitis. MRI on 01/01/2018 revealed chronic fusion of  superior aspect of right SI joint.  Dry eyes-she continues to have dry eyes.  Over-the-counter products were discussed.  Dry mouth-she continues to have dry mouth.  Over-the-counter products were discussed at length.  Fibromyalgia-she has generalized pain and discomfort from fibromyalgia.  Need for regular exercise and stretching was emphasized.  Family history of rheumatoid arthritis - mother.  Elevated sed rate - she has chronic elevation of sedimentation rate.  Her sed rate has been elevated for many years.  Other fatigue-related to fibromyalgia.  Hashimoto's thyroiditis - Followed by Dr. Sharl Ma.  Positive antithyroglobulin and positive antithyroid peroxidase antibodies.  History of gastroesophageal reflux (GERD)  History of hyperparathyroidism - She is followed by Dr. Sharl Ma.  Patient states that she will need parathyroidectomy in the near future.  Orders: No orders of the defined types were placed in this encounter.  No orders of the defined types were placed in this encounter.    Follow-Up Instructions: Return in about 6 months (around 10/28/2023) for palindromic rheumatism, OA, FMS.   Pollyann Savoy, MD  Note - This record has been created using Animal nutritionist.  Chart creation errors have been sought, but may not always  have been located. Such creation errors do not reflect on  the standard of medical care.

## 2023-04-18 ENCOUNTER — Other Ambulatory Visit (HOSPITAL_COMMUNITY): Payer: Self-pay | Admitting: Surgery

## 2023-04-18 DIAGNOSIS — E210 Primary hyperparathyroidism: Secondary | ICD-10-CM

## 2023-04-27 ENCOUNTER — Encounter: Payer: Self-pay | Admitting: Rheumatology

## 2023-04-27 ENCOUNTER — Ambulatory Visit: Payer: Medicare Other | Attending: Rheumatology | Admitting: Rheumatology

## 2023-04-27 VITALS — BP 148/92 | HR 69 | Resp 16 | Ht 65.0 in | Wt 238.0 lb

## 2023-04-27 DIAGNOSIS — R5383 Other fatigue: Secondary | ICD-10-CM | POA: Diagnosis present

## 2023-04-27 DIAGNOSIS — R700 Elevated erythrocyte sedimentation rate: Secondary | ICD-10-CM

## 2023-04-27 DIAGNOSIS — M1230 Palindromic rheumatism, unspecified site: Secondary | ICD-10-CM | POA: Diagnosis not present

## 2023-04-27 DIAGNOSIS — M79641 Pain in right hand: Secondary | ICD-10-CM

## 2023-04-27 DIAGNOSIS — M5030 Other cervical disc degeneration, unspecified cervical region: Secondary | ICD-10-CM | POA: Diagnosis not present

## 2023-04-27 DIAGNOSIS — E063 Autoimmune thyroiditis: Secondary | ICD-10-CM | POA: Diagnosis present

## 2023-04-27 DIAGNOSIS — Z8261 Family history of arthritis: Secondary | ICD-10-CM

## 2023-04-27 DIAGNOSIS — M461 Sacroiliitis, not elsewhere classified: Secondary | ICD-10-CM

## 2023-04-27 DIAGNOSIS — M79671 Pain in right foot: Secondary | ICD-10-CM

## 2023-04-27 DIAGNOSIS — M170 Bilateral primary osteoarthritis of knee: Secondary | ICD-10-CM | POA: Diagnosis present

## 2023-04-27 DIAGNOSIS — M79642 Pain in left hand: Secondary | ICD-10-CM | POA: Insufficient documentation

## 2023-04-27 DIAGNOSIS — M797 Fibromyalgia: Secondary | ICD-10-CM

## 2023-04-27 DIAGNOSIS — Z8639 Personal history of other endocrine, nutritional and metabolic disease: Secondary | ICD-10-CM

## 2023-04-27 DIAGNOSIS — H04123 Dry eye syndrome of bilateral lacrimal glands: Secondary | ICD-10-CM

## 2023-04-27 DIAGNOSIS — Z8719 Personal history of other diseases of the digestive system: Secondary | ICD-10-CM | POA: Diagnosis present

## 2023-04-27 DIAGNOSIS — M5136 Other intervertebral disc degeneration, lumbar region: Secondary | ICD-10-CM | POA: Insufficient documentation

## 2023-04-27 DIAGNOSIS — M79672 Pain in left foot: Secondary | ICD-10-CM | POA: Diagnosis present

## 2023-04-27 DIAGNOSIS — R682 Dry mouth, unspecified: Secondary | ICD-10-CM | POA: Diagnosis present

## 2023-04-27 DIAGNOSIS — M51369 Other intervertebral disc degeneration, lumbar region without mention of lumbar back pain or lower extremity pain: Secondary | ICD-10-CM

## 2023-04-27 NOTE — Patient Instructions (Signed)
Hydroxychloroquine Tablets What is this medication? HYDROXYCHLOROQUINE (hye drox ee KLOR oh kwin) treats autoimmune conditions, such as rheumatoid arthritis and lupus. It works by slowing down an overactive immune system. It may also be used to prevent and treat malaria. It works by killing the parasite that causes malaria. It belongs to a group of medications called DMARDs. This medicine may be used for other purposes; ask your health care provider or pharmacist if you have questions. COMMON BRAND NAME(S): Plaquenil, Quineprox What should I tell my care team before I take this medication? They need to know if you have any of these conditions: Diabetes Eye disease, vision problems Frequently drink alcohol G6PD deficiency Heart disease Irregular heartbeat or rhythm Kidney disease Liver disease Porphyria Psoriasis An unusual or allergic reaction to hydroxychloroquine, other medications, foods, dyes, or preservatives Pregnant or trying to get pregnant Breastfeeding How should I use this medication? Take this medication by mouth with water. Take it as directed on the prescription label. Do not cut, crush, or chew this medication. Swallow the tablets whole. Take it with food. Do not take it more than directed. Take all of this medication unless your care team tells you to stop it early. Keep taking it even if you think you are better. Take products with antacids in them at a different time of day than this medication. Take this medication 4 hours before or 4 hours after antacids. Talk to your care team if you have questions. Talk to your care team about the use of this medication in children. While this medication may be prescribed for selected conditions, precautions do apply. Overdosage: If you think you have taken too much of this medicine contact a poison control center or emergency room at once. NOTE: This medicine is only for you. Do not share this medicine with others. What if I miss a  dose? If you miss a dose, take it as soon as you can. If it is almost time for your next dose, take only that dose. Do not take double or extra doses. What may interact with this medication? Do not take this medication with any of the following: Cisapride Dronedarone Pimozide Thioridazine This medication may also interact with the following: Ampicillin Antacids Cimetidine Cyclosporine Digoxin Kaolin Medications for diabetes, such as insulin, glipizide, glyburide Medications for seizures, such as carbamazepine, phenobarbital, phenytoin Mefloquine Methotrexate Other medications that cause heart rhythm changes Praziquantel This list may not describe all possible interactions. Give your health care provider a list of all the medicines, herbs, non-prescription drugs, or dietary supplements you use. Also tell them if you smoke, drink alcohol, or use illegal drugs. Some items may interact with your medicine. What should I watch for while using this medication? Visit your care team for regular checks on your progress. Tell your care team if your symptoms do not start to get better or if they get worse. You may need blood work done while you are taking this medication. If you take other medications that can affect heart rhythm, you may need more testing. Talk to your care team if you have questions. Your vision may be tested before and during use of this medication. Tell your care team right away if you have any change in your eyesight. This medication may cause serious skin reactions. They can happen weeks to months after starting the medication. Contact your care team right away if you notice fevers or flu-like symptoms with a rash. The rash may be red or purple and then turn   into blisters or peeling of the skin. Or, you might notice a red rash with swelling of the face, lips or lymph nodes in your neck or under your arms. If you or your family notice any changes in your behavior, such as new or  worsening depression, thoughts of harming yourself, anxiety, or other unusual or disturbing thoughts, or memory loss, call your care team right away. What side effects may I notice from receiving this medication? Side effects that you should report to your care team as soon as possible: Allergic reactions--skin rash, itching, hives, swelling of the face, lips, tongue, or throat Aplastic anemia--unusual weakness or fatigue, dizziness, headache, trouble breathing, increased bleeding or bruising Change in vision Heart rhythm changes--fast or irregular heartbeat, dizziness, feeling faint or lightheaded, chest pain, trouble breathing Infection--fever, chills, cough, or sore throat Low blood sugar (hypoglycemia)--tremors or shaking, anxiety, sweating, cold or clammy skin, confusion, dizziness, rapid heartbeat Muscle injury--unusual weakness or fatigue, muscle pain, dark yellow or brown urine, decrease in amount of urine Pain, tingling, or numbness in the hands or feet Rash, fever, and swollen lymph nodes Redness, blistering, peeling, or loosening of the skin, including inside the mouth Thoughts of suicide or self-harm, worsening mood, or feelings of depression Unusual bruising or bleeding Side effects that usually do not require medical attention (report to your care team if they continue or are bothersome): Diarrhea Headache Nausea Stomach pain Vomiting This list may not describe all possible side effects. Call your doctor for medical advice about side effects. You may report side effects to FDA at 1-800-FDA-1088. Where should I keep my medication? Keep out of the reach of children and pets. Store at room temperature up to 30 degrees C (86 degrees F). Protect from light. Get rid of any unused medication after the expiration date. To get rid of medications that are no longer needed or have expired: Take the medication to a medication take-back program. Check with your pharmacy or law enforcement  to find a location. If you cannot return the medication, check the label or package insert to see if the medication should be thrown out in the garbage or flushed down the toilet. If you are not sure, ask your care team. If it is safe to put it in the trash, empty the medication out of the container. Mix the medication with cat litter, dirt, coffee grounds, or other unwanted substance. Seal the mixture in a bag or container. Put it in the trash. NOTE: This sheet is a summary. It may not cover all possible information. If you have questions about this medicine, talk to your doctor, pharmacist, or health care provider.  2023 Elsevier/Gold Standard (2008-01-18 00:00:00)  

## 2023-05-09 ENCOUNTER — Ambulatory Visit (INDEPENDENT_AMBULATORY_CARE_PROVIDER_SITE_OTHER): Payer: Medicare Other | Admitting: Podiatry

## 2023-05-09 ENCOUNTER — Encounter: Payer: Self-pay | Admitting: Podiatry

## 2023-05-09 DIAGNOSIS — Q6671 Congenital pes cavus, right foot: Secondary | ICD-10-CM | POA: Diagnosis not present

## 2023-05-09 DIAGNOSIS — G5792 Unspecified mononeuropathy of left lower limb: Secondary | ICD-10-CM

## 2023-05-09 DIAGNOSIS — M2170 Unequal limb length (acquired), unspecified site: Secondary | ICD-10-CM

## 2023-05-09 DIAGNOSIS — Q6672 Congenital pes cavus, left foot: Secondary | ICD-10-CM

## 2023-05-09 DIAGNOSIS — G5791 Unspecified mononeuropathy of right lower limb: Secondary | ICD-10-CM | POA: Diagnosis not present

## 2023-05-09 NOTE — Progress Notes (Signed)
  Subjective:  Patient ID: Meghan Owen, female    DOB: [birth year],   MRN: [id number]  Chief Complaint  Patient presents with   Neuroma    Rm 21 3 month follow up bilateral nerve pain. Pt states she is still having pain alos in both ankle. PT states she never had the never study done.     67 y.o. female presents fo follow-up  of bilateral foot pain. Relates the right is worse than the left. States a history of pes cavus reconstruction in 2014. Relates today most of the pain at the back of her heel. . Does relates some lower back pain and hip pain. She is scheduled for NCV at the beginning of July so awaiitng those results. Denies any current treatments.   . Denies any other pedal complaints. Denies n/v/f/c.   Past Medical History:  Diagnosis Date   Arthritis    Bursitis, hip    bilateral   DDD (degenerative disc disease), lumbar    Dyslipidemia    Elevated sed rate    Fatty liver    Fibromyalgia    GERD (gastroesophageal reflux disease)    HX   Hashimoto's thyroiditis    Headache    Hypercalcemia    Hyperparathyroidism (HCC)    Dr. Leslie Dales   Hypothyroidism    Liver cyst    BENIGN   Obesity    Osteoarthritis    Parathyroid disorder (HCC)    HYPERPARATHYROID   Paresthesia    Peripheral neuropathy    Spinal stenosis    Synovial cyst of elbow    Thyroid disease    Tremor    Vitamin D deficiency     Objective:  Physical Exam: Vascular: DP/PT pulses 2/4 bilateral. CFT <3 seconds. Normal hair growth on digits. No edema.  Skin. No lacerations or abrasions bilateral feet.  Musculoskeletal: MMT 5/5 bilateral lower extremities in DF, PF, Inversion and Eversion. Deceased ROM in DF of ankle joint. Some tenderness to around lateral ankle somewhat around the peroneal tendons bilateral. No pain with ROM.  Negative tinels signs. No pain with Rom of MMT. Neurological: Sensation intact to light touch.   Assessment:   1. Neuritis of left foot   2. Neuritis of right foot   3.  Acquired unequal limb length   4. Pes cavus of both feet       Plan:  Patient was evaluated and treated and all questions answered. X-rays reviewed and discussed with patient. No acute fractures or dislocations noted. Retianed hardware in second metatrsal on right and in calcaneas on right. Left fifth digit with varus rotation at DIPJ.  Discussed neuritis vs mechanical problem and etiology as well as treatment with patient.  Radiographs reviewed and discussed with patient.  -Discussed and educated patient on diabetic foot care, especially with  regards to the vascular, neurological and musculoskeletal systems.  -Discussed supportive shoes at all times and checking feet regularly.  -Discussed potentially trying gabapentin but patient would like to avoid medications.  -Awating NCV.  Discussed potential steroid injection to see if this calms down some of the pain on the outside of the foot but patient deferred.  -Patient to return after NCV.     Louann Sjogren, DPM

## 2023-05-11 ENCOUNTER — Ambulatory Visit (HOSPITAL_COMMUNITY): Payer: BLUE CROSS/BLUE SHIELD

## 2023-05-14 ENCOUNTER — Telehealth: Payer: Self-pay | Admitting: *Deleted

## 2023-05-14 NOTE — Progress Notes (Signed)
  Care Coordination   Note   05/14/2023 Name: MARIANNY MCFEELY MRN: [id number] DOB: 11-Nov-1956  TAEA DELANGE is a 67 y.o. year old female who sees Laurann Montana, MD for primary care. I reached out to Darolyn Rua by phone today to offer care coordination services.  Ms. Aylward was given information about Care Coordination services today including:   The Care Coordination services include support from the care team which includes your Nurse Coordinator, Clinical Social Worker, or Pharmacist.  The Care Coordination team is here to help remove barriers to the health concerns and goals most important to you. Care Coordination services are voluntary, and the patient may decline or stop services at any time by request to their care team member.   Care Coordination Consent Status: Patient agreed to services and verbal consent obtained.   Follow up plan:  Telephone appointment with care coordination team member scheduled for:  05/15/23  Encounter Outcome:  Pt. Scheduled  [facility] Coordination Care Guide  Direct Dial: [phone]

## 2023-05-15 ENCOUNTER — Ambulatory Visit: Payer: Self-pay

## 2023-05-15 NOTE — Patient Outreach (Unsigned)
  Care Coordination   Initial Visit Note   05/16/2023 Name: Meghan Owen MRN: [id number] DOB: 05/17/1956  Meghan Owen is a 67 y.o. year old female who sees Meghan Montana, MD for primary care. I spoke with  Meghan Owen by phone today.  What matters to the patients health and wellness today?   I spoke with Meghan Owen, who stated that she lives with her grandson and is managing reasonably well. She discussed Hashimoto's disease with me. At this time, she does not require any assistance, but she has the necessary information to contact for help in the future.      Goals Addressed             This Visit's Progress    Care Coordination Activities - no follow up required        Care Coordination Interventions: Active listening / Reflection utilized  Emotional Support Provided Problem Solving /Task Center strategies reviewed Discussed/.Educated Care Coordination Program 2.   Discussed/.Educated Annual Wellness Visit 3.   Discussed/.Educated Social Determinates of Health 4.   Please inform PCP if services needed in the future         SDOH assessments and interventions completed:  Yes  SDOH Interventions Today    Flowsheet Row Most Recent Value  SDOH Interventions   Food Insecurity Interventions Intervention Not Indicated  Transportation Interventions Intervention Not Indicated        Care Coordination Interventions:  Yes, provided   Interventions Today    Flowsheet Row Most Recent Value  General Interventions   General Interventions Discussed/Reviewed General Interventions Discussed  [Discussed the Care Corrdination Program]        Follow up plan: No further intervention required.   Encounter Outcome:  Pt. Visit Completed   Meghan Fairly RN, BSN, [facility] Care Coordinator [facility]   Phone: [phone]

## 2023-05-16 NOTE — Patient Outreach (Deleted)
  Care Coordination   Initial Visit Note   05/16/2023 Name: Meghan Owen MRN: [id number] DOB: [birth year]  Meghan Owen is a 67 y.o. year old female who sees Laurann Montana, MD for primary care. I spoke with  Meghan Owen by phone today.  What matters to the patients health and wellness today?  I spoke with Meghan Owen, who stated that she lives with her grandson and is managing reasonably well. She discussed Hashimoto's disease with me. At this time, she does not require any assistance, but she has the necessary information to contact for help in the future.    Goals Addressed             This Visit's Progress    Care Coordination Activities - no follow up required        Care Coordination Interventions: Active listening / Reflection utilized  Emotional Support Provided Problem Solving /Task Center strategies reviewed Discussed/.Educated Care Coordination Program 2.   Discussed/.Educated Annual Wellness Visit 3.   Discussed/.Educated Social Determinates of Health 4.   Please inform PCP if services needed in the future         SDOH assessments and interventions completed:  Yes{THN Tip this will not be part of the note when signed-REQUIRED REPORT FIELD DO NOT DELETE (Optional):27901}  SDOH Interventions Today    Flowsheet Row Most Recent Value  SDOH Interventions   Food Insecurity Interventions Intervention Not Indicated  Transportation Interventions Intervention Not Indicated        Care Coordination Interventions:  Yes, provided {THN Tip this will not be part of the note when signed-REQUIRED REPORT FIELD DO NOT DELETE (Optional):27901}  Interventions Today    Flowsheet Row Most Recent Value  General Interventions   General Interventions Discussed/Reviewed General Interventions Discussed  [Discussed the Care Corrdination Program]          Follow up plan: No further intervention required.   Encounter Outcome:  Pt. Visit Completed {THN Tip this will not be  part of the note when signed-REQUIRED REPORT FIELD DO NOT DELETE (Optional):27901}  .Juanell Fairly RN, BSN, [facility] Care Coordinator [facility]   Phone: [phone]

## 2023-05-16 NOTE — Patient Instructions (Signed)
Visit Information  Thank you for taking time to visit with me today. Please don't hesitate to contact me if I can be of assistance to you.   Following are the goals we discussed today:   Goals Addressed             This Visit's Progress    Care Coordination Activities - no follow up required        Care Coordination Interventions: Active listening / Reflection utilized  Emotional Support Provided Problem Solving /Task Center strategies reviewed Discussed/.Educated Care Coordination Program 2.   Discussed/.Educated Annual Wellness Visit 3.   Discussed/.Educated Social Determinates of Health 4.   Please inform PCP if services needed in the future           If you are experiencing a Mental Health or Behavioral Health Crisis or need someone to talk to, please call 1-800-273-TALK (toll free, 24 hour hotline)  Patient verbalizes understanding of instructions and care plan provided today and agrees to view in MyChart. Active MyChart status and patient understanding of how to access instructions and care plan via MyChart confirmed with patient.     Juanell Fairly RN, BSN, [facility] Care Coordinator [facility]   Phone: [phone]

## 2023-05-24 ENCOUNTER — Ambulatory Visit (HOSPITAL_COMMUNITY)
Admission: RE | Admit: 2023-05-24 | Discharge: 2023-05-24 | Disposition: A | Discharge status: Home / Self Care | Payer: Medicare Other | Source: Ambulatory Visit | Attending: Surgery | Admitting: Surgery

## 2023-05-24 DIAGNOSIS — D351 Benign neoplasm of parathyroid gland: Secondary | ICD-10-CM | POA: Insufficient documentation

## 2023-05-24 DIAGNOSIS — E210 Primary hyperparathyroidism: Secondary | ICD-10-CM | POA: Diagnosis present

## 2023-05-24 MED ORDER — GADOBUTROL 1 MMOL/ML IV SOLN
10.0000 mL | Freq: Once | INTRAVENOUS | Status: AC | PRN
  Administered 2023-05-24: 10 mL via INTRAVENOUS

## 2023-06-04 NOTE — Progress Notes (Signed)
MRI is negative for parathyroid adenoma.  Likewise, USN and sestamibi fail to identify an adenoma.  Tresa Endo - schedule patient for follow up visit with me to discuss results and options for further management.  tmg  Darnell Level, MD [facility] practice Office: [phone]

## 2023-06-19 ENCOUNTER — Encounter: Payer: Self-pay | Admitting: Diagnostic Neuroimaging

## 2023-06-19 ENCOUNTER — Ambulatory Visit (INDEPENDENT_AMBULATORY_CARE_PROVIDER_SITE_OTHER): Payer: Medicare Other | Admitting: Diagnostic Neuroimaging

## 2023-06-19 VITALS — BP 131/69 | HR 61 | Ht 65.0 in | Wt 237.8 lb

## 2023-06-19 DIAGNOSIS — R200 Anesthesia of skin: Secondary | ICD-10-CM | POA: Diagnosis not present

## 2023-06-19 NOTE — Progress Notes (Signed)
[facility]  PATIENT: Meghan Owen DOB: 11/28/1956  REFERRING CLINICIAN: Laurann Montana, MD HISTORY FROM: patient  REASON FOR VISIT: new consult   HISTORICAL  CHIEF COMPLAINT:  Chief Complaint  Patient presents with   Follow-up    Patient in room #7 and alone. Patient states she here to discuss neuritis of R Foot.    HISTORY OF PRESENT ILLNESS:   UPDATE (06/19/23, VRP): Since last visit, doing about the same. Symptoms are continuing (numbness, discomfort, buzzing sensations; some in feet and toes and ankles; some in hands and fingertips). Has been to podiatry for foot / toe symptoms and and repeat EMG was requested. Patient having right > left toe and foot sensations. Also was recommended to try plaquenil by rheumatology, but patient wants to hold off.fortunately HA have improved. Not interested in any other neuropathic pain meds (previously tried cymbalta and gabapentin).    UPDATE (09/25/22, VRP): Since last visit, doing about the same in terms general symptoms (migratory, intermittent sharp pinprick sensations throughout her body, dizziness, gait difficulty, vision changes, neck pain).  Also having new onset of headache starting 07/09/2022 with pain in the back of head, seeing half-moon black-and-white sensation, wavy lines, moving up and down her visual field, lasting 10 minutes at a time.  Symptoms associated with nausea and photophobia.  She looked up symptoms online and was concerned about migraine with aura.  Never had these kinds of headaches before in her life.  Still struggling with some issues related to sleep and stress factors but these are improving.  PRIOR (Dr. Anne Hahn 01/31/21): Ms. Manzo is a 67 year old female with history of cervical spondylosis with surgery, episodic tremor, gait disturbance.  Some question of fibromyalgia in the past.  Has chronically elevated sed rate, CRP.  Has positive ANA, follows with Dr. Corliss Skains for undetermined connective  tissue disorder, possibly RA.  She has Hashimoto's thyroiditis.  Her cervical spine surgery was in 2007 with Dr. Venetia Maxon, over the years, intermittent episodes of prickly sensations through the body.  This sensation worsened after the passing of her husband in December 2020.  Laboratory evaluation showed a slightly elevated copper level.  MRI of the brain and cervical spine were unremarkable.  Felt to be from stress or fibromyalgia.  Has seen oncology, CRP, ESR improved.   Today, she has done some research and thinks she has small fiber neuropathy. She feels burning all over her body, started in her feet. Has tingling in both uppers, worse on the left. Feels something is crawling under her skin, claims it started early 2021 when she was referred here. The burning and prickly are separate and both come and go. Feels someone sticking her with a pin all over, then electrical shocks all over. Feels this is getting worse. Claims these come in flares, when bad has to use a walker. The paresthesia symptoms is her main concerns. At times she feels she may fall, has to hold on. In 2018 had NCV left upper, had CTS surgery still problem. Claims does not have fibromyalgia.  Here today for evaluation unaccompanied.   REVIEW OF SYSTEMS: Full 14 system review of systems performed and negative with exception of: as per HPI.  ALLERGIES: Allergies  Allergen Reactions   Aspirin Palpitations and Other (See Comments)    Aspirin with caffeine only. Causes tachycardia Other reaction(s): tachycardia   Penicillin V Potassium     Other reaction(s): increased infections   Caffeine Palpitations    Other reaction(s): tachycardia   Iodinated  Contrast Media Other (See Comments)    Hot flashes  Other reaction(s): flushing   Iodine-131 Other (See Comments)    flushing   Ioxaglate Other (See Comments)    Hot flashes    Penicillins Other (See Comments)    Makes have more infections  Has patient had a PCN reaction causing  immediate rash, facial/tongue/throat swelling, SOB or lightheadedness with hypotension: No Has patient had a PCN reaction causing severe rash involving mucus membranes or skin necrosis: No Has patient had a PCN reaction that required hospitalization: No Has patient had a PCN reaction occurring within the last 10 years:No If all of the above answers are "NO", then may proceed with Cephalosporin use.    Prednisone Other (See Comments)    Repeats infections Other reaction(s): increased sinus infections    HOME MEDICATIONS: Outpatient Medications Prior to Visit  Medication Sig Dispense Refill   b complex vitamins tablet Take 1 tablet by mouth daily.     Cholecalciferol (VITAMIN D3) 5000 UNITS CAPS Take 5,000 Units by mouth daily.      Digestive Enzymes (DIGESTIVE ENZYME PO) Take by mouth.     MAGNESIUM PO Take by mouth daily.     Menatetrenone (VITAMIN K2) 100 MCG TABS See admin instructions.     Probiotic Product (PROBIOTIC PO) Take 1 tablet by mouth daily.     TURMERIC PO Take 1,500 mg by mouth as needed.     UNABLE TO FIND Med Name: Curamin 1 tablet as needed 350mg      No facility-administered medications prior to visit.      PHYSICAL EXAM  GENERAL EXAM/CONSTITUTIONAL: Vitals:  Vitals:   06/19/23 0801  BP: 131/69  Pulse: 61  Weight: 237 lb 12.8 oz (107.9 kg)  Height: 5\' 5"  (1.651 m)   Body mass index is 39.57 kg/m. Wt Readings from Last 3 Encounters:  06/19/23 237 lb 12.8 oz (107.9 kg)  04/27/23 238 lb (108 kg)  10/26/22 229 lb 3.2 oz (104 kg)   Patient is in no distress; well developed, nourished and groomed; neck is supple  CARDIOVASCULAR: Examination of carotid arteries is normal; no carotid bruits Regular rate and rhythm, no murmurs Examination of peripheral vascular system by observation and palpation is normal  EYES: Ophthalmoscopic exam of optic discs and posterior segments is normal; no papilledema or hemorrhages No results  found.  MUSCULOSKELETAL: Gait, strength, tone, movements noted in Neurologic exam below  NEUROLOGIC: MENTAL STATUS:      No data to display         awake, alert, oriented to person, place and time recent and remote memory intact normal attention and concentration language fluent, comprehension intact, naming intact fund of knowledge appropriate  CRANIAL NERVE:  2nd - no papilledema on fundoscopic exam 2nd, 3rd, 4th, 6th - pupils equal and reactive to light, visual fields full to confrontation, extraocular muscles intact, no nystagmus 5th - facial sensation symmetric 7th - facial strength symmetric 8th - hearing intact 9th - palate elevates symmetrically, uvula midline 11th - shoulder shrug symmetric 12th - tongue protrusion midline  MOTOR:  normal bulk and tone, full strength in the BUE, BLE  SENSORY:  normal and symmetric to light touch, temperature, vibration  COORDINATION:  finger-nose-finger, fine finger movements normal  REFLEXES:  deep tendon reflexes 1+ and symmetric  GAIT/STATION:  narrow based gait     DIAGNOSTIC DATA (LABS, IMAGING, TESTING) - I reviewed patient records, labs, notes, testing and imaging myself where available.  Lab Results  Component  Value Date   WBC 6.8 10/03/2021   HGB 14.1 10/03/2021   HCT 42.6 10/03/2021   MCV 88.8 10/03/2021   PLT 230 10/03/2021      Component Value Date/Time   NA 136 10/03/2021 0930   K 4.4 10/03/2021 0930   CL 102 10/03/2021 0930   CO2 22 10/03/2021 0930   GLUCOSE 64 (L) 10/03/2021 0930   BUN 7 10/03/2021 0930   CREATININE 0.79 10/03/2021 0930   CALCIUM 11.6 (H) 10/03/2021 0930   PROT 8.6 (H) 10/03/2021 0930   PROT 9.1 (H) 10/03/2021 0930   ALBUMIN 4.0 05/25/2021 1059   AST 24 10/03/2021 0930   ALT 17 10/03/2021 0930   ALKPHOS 112 05/25/2021 1059   BILITOT 1.2 10/03/2021 0930   GFRNONAA >60 05/25/2021 1059   GFRNONAA 88 08/20/2019 1211   GFRAA 102 08/20/2019 1211   No results found for:  "CHOL", "HDL", "LDLCALC", "LDLDIRECT", "TRIG", "CHOLHDL" Lab Results  Component Value Date   HGBA1C 5.9 (H) 01/26/2022   Lab Results  Component Value Date   VITAMINB12 547 05/07/2020   No results found for: "TSH"      Component Ref Range & Units 2 yr ago  Total Protein ELP 6.0 - 8.5 g/dL 7.7  Albumin ELP 2.9 - 4.4 g/dL 3.6  NWGNF-6-OZHYQMVH 0.0 - 0.4 g/dL 0.2  QIONG-2-XBMWUXLK 0.4 - 1.0 g/dL 0.7  Beta Globulin 0.7 - 1.3 g/dL 1.2  Gamma Globulin 0.4 - 1.8 g/dL 2.0 High   M-Spike, % Not Observed g/dL Not Observed  SPE Interp. Comment  Comment: (NOTE) The SPE pattern reflects a polyclonal increase in gamma globulin. Hypergammaglobulinemia is found in a wide variety of infectious, non-infectious, and autoimmune disease states. Evidence of monoclonal protein is not apparent.      03/07/16 EMG/NCS ([facility]) This is a mildly abnormal study. There is mild chronic reinnervation limited to the triceps muscle. There is no electrodiagnostic evidence for radiculopathy, myopathy or a polyneuropathy.  06/22/20 MRI brain Normal  06/22/20 MRI cervical ACDF C3-C7. No spinal stenosis or foraminal narrowing.   02/21/21 EMG/NCS (Dr. Anne Hahn) - Nerve conduction studies done on the left upper and left lower extremities were unremarkable, no evidence of a neuropathy is seen.  EMG evaluation of the left upper extremity was unremarkable, no evidence of an overlying cervical radiculopathy was seen.  06/08/22 MRI lumbar spine [I reviewed images myself and agree with interpretation. -VRP]  - Mild lumbar degenerative change. Negative for neural impingement or stenosis.  10/19/22 MRI brain  - normal   ASSESSMENT AND PLAN  67 y.o. year old female here with:   Dx:  1. Numbness      PLAN:  NUMBNESS / PAIN (diffuse; arms, hands, knees, legs, feet; since ~2021) - Unclear etiology in spite of extensive work-up at [facility] neurology, [facility] neurology, and [facility] with  Dr. Anne Hahn in the past; considerations would include autoimmune condition, idiopathic small fiber neuropathy, stress reaction, hypersensitivity pain syndrome. - has tried duloxetine and gabapentin in past, but caused side effects; patient not that interested in other medications for sensations in the feet at this time; repeat EMG/NCS not likely to lead to any change in treatment options; prior testing in 2017 and 2022 were unremarkable  HEADACHE / VISION CHANGES (improved) - suspect migraine; but since new onset in July 2023, and no prior migraine history, will check MRI brain to rule out other causes - then may consider migraine treatments (topiramate, rizatriptan)   Return for pending if symptoms  worsen or fail to improve, return to referring provider.    Suanne Marker, MD 06/19/2023, 8:53 AM Certified in Neurology, Neurophysiology and Neuroimaging  [facility] [address] [place], [place] [address] [phone]

## 2023-07-05 ENCOUNTER — Telehealth: Payer: Self-pay | Admitting: Diagnostic Neuroimaging

## 2023-07-05 ENCOUNTER — Encounter: Payer: Self-pay | Admitting: Anesthesiology

## 2023-07-05 ENCOUNTER — Other Ambulatory Visit: Payer: Self-pay | Admitting: Anesthesiology

## 2023-07-05 DIAGNOSIS — R519 Headache, unspecified: Secondary | ICD-10-CM

## 2023-07-05 NOTE — Telephone Encounter (Signed)
medicare/BCBS sup sent to [facility] [phone]

## 2023-07-05 NOTE — Telephone Encounter (Signed)
Pt reports that it has been a while but she has recently had a headache episode that she would like a call to discuss. Pt states ut was a headache with auras, please call.

## 2023-07-16 ENCOUNTER — Telehealth: Payer: Self-pay | Admitting: Diagnostic Neuroimaging

## 2023-07-16 NOTE — Telephone Encounter (Signed)
Pt said has an MRI scheduled with GI for brain. Have an appt with neurosurgeon on 07/25/23 and they may order an MRI for the cervical spine. If neurosurgeon may wan an MRI Wanted to know if Dr Marjory Lies can do an order for both brain and cervical spine to be only do one MRI. Would like a call from the nurse.

## 2023-07-17 NOTE — Telephone Encounter (Signed)
Returned patient call and LVM to give [place] a call or check her My Chart messages.

## 2023-07-21 ENCOUNTER — Other Ambulatory Visit: Payer: BLUE CROSS/BLUE SHIELD

## 2023-07-25 ENCOUNTER — Other Ambulatory Visit: Payer: Self-pay | Admitting: Physician Assistant

## 2023-07-25 DIAGNOSIS — M47812 Spondylosis without myelopathy or radiculopathy, cervical region: Secondary | ICD-10-CM

## 2023-07-30 ENCOUNTER — Other Ambulatory Visit: Payer: Self-pay

## 2023-07-30 DIAGNOSIS — R519 Headache, unspecified: Secondary | ICD-10-CM

## 2023-07-30 NOTE — Telephone Encounter (Signed)
Pt reports that she has had another headache , she would like to know if the MRI can be changed to one with contrast, please call to confirm.

## 2023-07-30 NOTE — Telephone Encounter (Signed)
Called patient and informing her that we have changed her MRI of the brain to MRI Arlys John W/WO contrast. Patient will call [facility] to reschedule her appointment. Pt verbalized understanding. Pt had no questions at this time but was encouraged to call back if questions arise.

## 2023-07-31 ENCOUNTER — Telehealth: Payer: Self-pay | Admitting: Diagnostic Neuroimaging

## 2023-07-31 NOTE — Telephone Encounter (Signed)
medicare/BCBS sup sent to [facility] [phone]

## 2023-08-12 DIAGNOSIS — Z8616 Personal history of COVID-19: Secondary | ICD-10-CM

## 2023-08-12 HISTORY — DX: Personal history of COVID-19: Z86.16

## 2023-08-25 ENCOUNTER — Other Ambulatory Visit: Payer: BLUE CROSS/BLUE SHIELD

## 2023-09-12 ENCOUNTER — Ambulatory Visit
Admission: RE | Admit: 2023-09-12 | Discharge: 2023-09-12 | Disposition: A | Discharge status: Home / Self Care | Payer: Medicare Other | Source: Ambulatory Visit | Attending: Diagnostic Neuroimaging | Admitting: Diagnostic Neuroimaging

## 2023-09-12 DIAGNOSIS — R519 Headache, unspecified: Secondary | ICD-10-CM

## 2023-09-12 MED ORDER — GADOPICLENOL 0.5 MMOL/ML IV SOLN
10.0000 mL | Freq: Once | INTRAVENOUS | Status: AC | PRN
  Administered 2023-09-12: 10 mL via INTRAVENOUS

## 2023-09-12 MED ORDER — GADOPICLENOL 0.5 MMOL/ML IV SOLN: 10.0000 mL | Freq: Once | INTRAVENOUS | Status: DC | PRN

## 2023-09-15 ENCOUNTER — Ambulatory Visit
Admission: RE | Admit: 2023-09-15 | Discharge: 2023-09-15 | Disposition: A | Discharge status: Home / Self Care | Payer: Medicare Other | Source: Ambulatory Visit | Attending: Physician Assistant | Admitting: Physician Assistant

## 2023-09-15 DIAGNOSIS — M47812 Spondylosis without myelopathy or radiculopathy, cervical region: Secondary | ICD-10-CM

## 2023-10-15 NOTE — Progress Notes (Signed)
Office Visit Note  Patient: Meghan Owen             Date of Birth: 03-02-1956           MRN: [id number]             PCP: Laurann Montana, MD Referring: Laurann Montana, MD Visit Date: 10/26/2023 Occupation: @GUAROCC @  Subjective:  Muscle pain and pain in multiple joints  History of Present Illness: Meghan Owen is a 67 y.o. female palindromic rheumatism, osteoarthritis and degenerative disc disease.  Returns today after her last visit in May 2024.  Patient states she developed COVID-19 virus infection August 2024.  She states the symptoms did not last for a long time.  She did not take any medications without any complications.  She states she has been experiencing lower extremity muscle pain.  She states she has difficulty getting up.  She also has discomfort in her knee joints.  She has not noticed any joint swelling.  She was experiencing aura with migraines and also headaches.  She was evaluated by Dr. Marjory Lies and had MRI of her brain and her cervical spine.  The cervical spine MRI showed degenerative changes and previous fusion.  She also was found to have elevated PTH and her appointment with endocrinologist is pending.  She continues to have discomfort in the lower back and her feet.  Dry mouth and dry eye symptoms are manageable.  She continues to have generalized pain from fibromyalgia.    Activities of Daily Living:  Patient reports morning stiffness for all day. Patient Reports nocturnal pain.  Difficulty dressing/grooming: Denies Difficulty climbing stairs: Denies Difficulty getting out of chair: Reports Difficulty using hands for taps, buttons, cutlery, and/or writing: Denies  Review of Systems  Constitutional:  Positive for fatigue.  HENT:  Positive for mouth dryness. Negative for mouth sores.   Eyes:  Positive for dryness.  Respiratory:  Negative for difficulty breathing.   Cardiovascular:  Negative for chest pain.  Gastrointestinal:  Positive for constipation and  diarrhea. Negative for blood in stool.  Endocrine: Positive for increased urination.  Genitourinary:  Negative for painful urination and involuntary urination.  Musculoskeletal:  Positive for joint pain, gait problem, joint pain, myalgias, muscle weakness, morning stiffness, muscle tenderness and myalgias. Negative for joint swelling.  Skin:  Positive for hair loss. Negative for color change, rash and sensitivity to sunlight.  Allergic/Immunologic: Negative for susceptible to infections.  Neurological:  Positive for headaches. Negative for dizziness.  Hematological:  Negative for swollen glands.  Psychiatric/Behavioral:  Positive for sleep disturbance. Negative for depressed mood. The patient is not nervous/anxious.     PMFS History:  Patient Active Problem List   Diagnosis Date Noted   Elevated C-reactive protein (CRP) 06/01/2021   Paresthesia 01/31/2021   Osteoarthritis of both sacroiliac joints (HCC) 01/04/2018   Primary osteoarthritis of both knees 01/04/2018   DDD (degenerative disc disease), cervical 01/04/2018   DDD (degenerative disc disease), lumbar 01/04/2018   Chronic right shoulder pain 01/04/2018   Fibromyalgia 01/04/2018   Hashimoto's thyroiditis 01/04/2018   History of hyperparathyroidism 01/04/2018   History of gastroesophageal reflux (GERD) 01/04/2018   Family history of rheumatoid arthritis 01/04/2018   Cervical myelopathy (HCC) 09/05/2016   Primary osteoarthritis of left hip 01/04/2016   Chronic left hip pain 12/15/2014   Hypercalcemia 09/08/2013   Varus deformity of foot 08/26/2013   Peroneal tendonitis 05/23/2013   Conversion disorder 05/21/2013   Low back pain 11/01/2012   Achilles tendinitis  09/05/2012   Bilateral ankle pain 09/05/2012   Hammer toe, acquired 09/05/2012   Cubital tunnel syndrome on left 08/22/2012   Stress fracture 07/04/2012   Lumbar facet arthropathy 05/10/2012   Diffuse myofascial pain syndrome 01/25/2012   GERD 02/05/2009   OTHER  BURSITIS DISORDERS 02/05/2009   ABNORMAL ELECTROCARDIOGRAM 02/05/2009    Past Medical History:  Diagnosis Date   Arthritis    Bursitis, hip    bilateral   DDD (degenerative disc disease), lumbar    Dyslipidemia    Elevated sed rate    Fatty liver    Fibromyalgia    GERD (gastroesophageal reflux disease)    HX   Hashimoto's thyroiditis    Headache    History of COVID-19 08/2023   Hypercalcemia    Hyperparathyroidism (HCC)    Dr. Leslie Dales   Hypothyroidism    Liver cyst    BENIGN   Obesity    Osteoarthritis    Parathyroid disorder (HCC)    HYPERPARATHYROID   Paresthesia    Peripheral neuropathy    Spinal stenosis    Synovial cyst of elbow    Thyroid disease    Tremor    Vitamin D deficiency     Family History  Problem Relation Age of Onset   Lupus Mother    Rheum arthritis Mother    Dementia Father    Leukemia Father    Cancer Sister        bone    Healthy Daughter    Healthy Son    Healthy Son    Cancer Other    Past Surgical History:  Procedure Laterality Date   ABDOMINAL HYSTERECTOMY     ANKLE SURGERY     ANTERIOR CERVICAL DECOMPRESSION/DISCECTOMY FUSION 4 LEVELS N/A 09/05/2016   Procedure: Cervical three-four Cervical four-five  Cervical five-six Cervical six-seven  Anterior cervical decompression/diskectomy/fusion;  Surgeon: Maeola Harman, MD;  Location: [facility];  Service: Neurosurgery;  Laterality: N/A;   CARPAL TUNNEL RELEASE Left 02/26/2018   ELBOW SURGERY  04/2017   cyst removed    FOOT SURGERY     TOE SURGERY Bilateral    Social History   Social History Narrative   Caffeine yes, widowed.  3 children.     There is no immunization history on file for this patient.   Objective: Vital Signs: BP 138/82 (BP Location: Left Arm, Patient Position: Sitting, Cuff Size: Large)   Pulse 65   Resp 16   Ht 5\' 5"  (1.651 m)   Wt 225 lb 12.8 oz (102.4 kg)   BMI 37.58 kg/m    Physical Exam Vitals and nursing note reviewed.  Constitutional:       Appearance: She is well-developed.  HENT:     Head: Normocephalic and atraumatic.  Eyes:     Conjunctiva/sclera: Conjunctivae normal.  Cardiovascular:     Rate and Rhythm: Normal rate and regular rhythm.     Heart sounds: Normal heart sounds.  Pulmonary:     Effort: Pulmonary effort is normal.     Breath sounds: Normal breath sounds.  Abdominal:     General: Bowel sounds are normal.     Palpations: Abdomen is soft.  Musculoskeletal:     Cervical back: Normal range of motion.  Lymphadenopathy:     Cervical: No cervical adenopathy.  Skin:    General: Skin is warm and dry.     Capillary Refill: Capillary refill takes less than 2 seconds.  Neurological:     Mental Status: She is  alert and oriented to person, place, and time.  Psychiatric:        Behavior: Behavior normal.      Musculoskeletal Exam: Patient had limited lateral rotation of the cervical spine.  She had no tenderness over thoracic or lumbar spine.  Shoulder joints, elbow joints, wrist joints, MCPs PIPs and DIPs with good range of motion with no synovitis.  Hip joints have good range of motion.  She had tenderness over bilateral trochanteric bursa more on the left side.  She had discomfort range of motion of her knee joints without any warmth swelling or effusion.  There was no tenderness over ankles or MTPs.  No synovitis was noted on the examination today.  CDAI Exam: CDAI Score: -- Patient Global: --; Provider Global: -- Swollen: --; Tender: -- Joint Exam 10/26/2023   No joint exam has been documented for this visit   There is currently no information documented on the homunculus. Go to the Rheumatology activity and complete the homunculus joint exam.  Investigation: No additional findings.  Imaging: XR HIPS BILAT W OR W/O PELVIS 3-4 VIEWS  Result Date: 10/26/2023 Right hip joint showed moderate joint space narrowing.  Left hip joint showed moderate hip joint narrowing and inferior spurring.  No SI joint  narrowing or sclerosis was noted. Impression: These findings are suggestive of bilateral moderate osteoarthritis of the hip joints.   Recent Labs: Lab Results  Component Value Date   WBC 6.8 10/03/2021   HGB 14.1 10/03/2021   PLT 230 10/03/2021   NA 136 10/03/2021   K 4.4 10/03/2021   CL 102 10/03/2021   CO2 22 10/03/2021   GLUCOSE 64 (L) 10/03/2021   BUN 7 10/03/2021   CREATININE 0.79 10/03/2021   BILITOT 1.2 10/03/2021   ALKPHOS 112 05/25/2021   AST 24 10/03/2021   ALT 17 10/03/2021   PROT 8.6 (H) 10/03/2021   PROT 9.1 (H) 10/03/2021   ALBUMIN 4.0 05/25/2021   CALCIUM 11.6 (H) 10/03/2021   GFRAA 102 08/20/2019    Speciality Comments: No specialty comments available.  Procedures:  No procedures performed Allergies: Aspirin, Penicillin v potassium, Caffeine, Iodinated contrast media, Iodine-131, Ioxaglate, Penicillins, and Prednisone   Assessment / Plan:     Visit Diagnoses: Palindromic rheumatism - September 12, 2022 AVISE Lupus index -2.7. anti-carP positive, antihistone negative, anti-TPO positive, antithyroglobulin positive.  Patient denies having any episodes of joint swelling.  Pain in both hands -she is stiffness in her hands but she has not noticed any swelling.  No synovitis was noted on the examination.  X-rays of both hands consistent with early osteoarthritis on 03/28/21.  Trochanteric bursitis of both hips-she has been having discomfort walking due to pain in her hips.  She had good range of motion of the hip joints.  She had tenderness over bilateral trochanteric bursa.  A handout on IT band stretches was given.  I also gave her a referral to physical therapy.  I discussed that we can do a cortisone injection in the future if her symptoms do not improve.  Chronic pain of both hips -she complains of hip joint discomfort.  Bilateral hip joints with good range of motion.  Plan: XR HIPS BILAT W OR W/O PELVIS 3-4 VIEWS x-rays of bilateral hip joint showed moderate  osteoarthritis more prominent on the left side with some spurring.  X-ray findings were reviewed with the patient.  Primary osteoarthritis of both knees -she has been having discomfort in her knee joints.  No warmth swelling  or effusion was noted.  Patient denies any history of joint swelling.  Previous x-rays showed left knee joint mild osteoarthritis and severe chondromalacia patella and right knee joint and osteoarthritis and chondromalacia patella.  I will refer her to physical therapy.  Pain in both feet -she continues to have a stiffness in her feet.  There is no tenderness on palpation.  Patient would like to hold off hydroxychloroquine at this point.  X-rays of both feet consistent with osteoarthritis on 03/28/21.  DDD (degenerative disc disease), cervical -she had limited range of motion of the cervical spine.  She had recent MRI of the cervical spine by Dr. Marjory Lies which I reviewed.  There was no radiographic progression when compared to the previous MRI.  S/p fusion-Dr. Venetia Maxon 2017: EMG on 02/21/21: evaluation of the left upper extremity was unremarkable.  Spondylosis of lumbar spine -she continues to have lower back pain.  She denies any radiculopathy.  X-rays of the lumbar spine were consistent with facet joint arthropathy and mild dextroscoliosis. MRI of lumbar spine performed on 12/20/20.  Osteoarthritis of both sacroiliac joints (HCC) -chronic pain.  HLA-B27 negative, MRI negative for sacroiliitis. MRI on 01/01/2018 revealed chronic fusion of superior aspect of right SI joint.  Dry eyes-dry eye symptoms are manageable.  Dry mouth-patient continues to have dry mouth but the symptoms are manageable.  Myalgia -patient acquired COVID-19 virus infection in September.  Patient states since then she has been having increased muscle pain.  Plan: CK, Sedimentation rate.  Will contact her once the lab results are available.  Fibromyalgia-she could history of some generalized pain and discomfort  from fibromyalgia.  Water aerobics and stretching was emphasized.  Other medical problems are listed as follows:  Family history of rheumatoid arthritis - mother.  Elevated sed rate-she had chronically elevated sedimentation rate.  I will repeat sed rate per her request.  Other fatigue -she has been experiencing increased fatigue.  Plan: CBC with Differential/Platelet, COMPLETE METABOLIC PANEL WITH GFR  Hashimoto's thyroiditis - Followed by Dr. Sharl Ma.  Positive antithyroglobulin and positive antithyroid peroxidase antibodies.  Patient was evaluated by Dr. Selena Batten.  Patient states she has an appointment coming up to evaluate hyperparathyroidism.  History of gastroesophageal reflux (GERD)  History of hyperparathyroidism - She is followed by Dr. Sharl Ma.  Patient states that she will need parathyroidectomy in the near future.  Orders: Orders Placed This Encounter  Procedures   XR HIPS BILAT W OR W/O PELVIS 3-4 VIEWS   CBC with Differential/Platelet   COMPLETE METABOLIC PANEL WITH GFR   CK   Sedimentation rate   Ambulatory referral to Physical Therapy   No orders of the defined types were placed in this encounter.    Follow-Up Instructions: Return in about 6 months (around 04/24/2024) for palindromic mutism, osteoarthritis,.   Pollyann Savoy, MD  Note - This record has been created using Animal nutritionist.  Chart creation errors have been sought, but may not always  have been located. Such creation errors do not reflect on  the standard of medical care.

## 2023-10-26 ENCOUNTER — Ambulatory Visit: Payer: Medicare Other | Attending: Rheumatology | Admitting: Rheumatology

## 2023-10-26 ENCOUNTER — Encounter: Payer: Self-pay | Admitting: Rheumatology

## 2023-10-26 ENCOUNTER — Ambulatory Visit (INDEPENDENT_AMBULATORY_CARE_PROVIDER_SITE_OTHER): Payer: Medicare Other

## 2023-10-26 VITALS — BP 138/82 | HR 65 | Resp 16 | Ht 65.0 in | Wt 225.8 lb

## 2023-10-26 DIAGNOSIS — M79642 Pain in left hand: Secondary | ICD-10-CM | POA: Insufficient documentation

## 2023-10-26 DIAGNOSIS — M461 Sacroiliitis, not elsewhere classified: Secondary | ICD-10-CM | POA: Diagnosis present

## 2023-10-26 DIAGNOSIS — M5030 Other cervical disc degeneration, unspecified cervical region: Secondary | ICD-10-CM | POA: Insufficient documentation

## 2023-10-26 DIAGNOSIS — H04123 Dry eye syndrome of bilateral lacrimal glands: Secondary | ICD-10-CM | POA: Diagnosis present

## 2023-10-26 DIAGNOSIS — M7062 Trochanteric bursitis, left hip: Secondary | ICD-10-CM | POA: Diagnosis present

## 2023-10-26 DIAGNOSIS — M47816 Spondylosis without myelopathy or radiculopathy, lumbar region: Secondary | ICD-10-CM | POA: Insufficient documentation

## 2023-10-26 DIAGNOSIS — E063 Autoimmune thyroiditis: Secondary | ICD-10-CM | POA: Insufficient documentation

## 2023-10-26 DIAGNOSIS — M79671 Pain in right foot: Secondary | ICD-10-CM | POA: Diagnosis present

## 2023-10-26 DIAGNOSIS — M25551 Pain in right hip: Secondary | ICD-10-CM | POA: Diagnosis not present

## 2023-10-26 DIAGNOSIS — M7061 Trochanteric bursitis, right hip: Secondary | ICD-10-CM | POA: Insufficient documentation

## 2023-10-26 DIAGNOSIS — M170 Bilateral primary osteoarthritis of knee: Secondary | ICD-10-CM | POA: Diagnosis present

## 2023-10-26 DIAGNOSIS — Z8639 Personal history of other endocrine, nutritional and metabolic disease: Secondary | ICD-10-CM | POA: Insufficient documentation

## 2023-10-26 DIAGNOSIS — M797 Fibromyalgia: Secondary | ICD-10-CM | POA: Diagnosis present

## 2023-10-26 DIAGNOSIS — M791 Myalgia, unspecified site: Secondary | ICD-10-CM | POA: Diagnosis present

## 2023-10-26 DIAGNOSIS — M25552 Pain in left hip: Secondary | ICD-10-CM | POA: Insufficient documentation

## 2023-10-26 DIAGNOSIS — Z8719 Personal history of other diseases of the digestive system: Secondary | ICD-10-CM | POA: Diagnosis present

## 2023-10-26 DIAGNOSIS — G8929 Other chronic pain: Secondary | ICD-10-CM | POA: Insufficient documentation

## 2023-10-26 DIAGNOSIS — R5383 Other fatigue: Secondary | ICD-10-CM | POA: Insufficient documentation

## 2023-10-26 DIAGNOSIS — M79641 Pain in right hand: Secondary | ICD-10-CM | POA: Insufficient documentation

## 2023-10-26 DIAGNOSIS — R700 Elevated erythrocyte sedimentation rate: Secondary | ICD-10-CM | POA: Diagnosis present

## 2023-10-26 DIAGNOSIS — Z8261 Family history of arthritis: Secondary | ICD-10-CM | POA: Insufficient documentation

## 2023-10-26 DIAGNOSIS — R682 Dry mouth, unspecified: Secondary | ICD-10-CM | POA: Diagnosis present

## 2023-10-26 DIAGNOSIS — M1230 Palindromic rheumatism, unspecified site: Secondary | ICD-10-CM | POA: Insufficient documentation

## 2023-10-26 DIAGNOSIS — M79672 Pain in left foot: Secondary | ICD-10-CM | POA: Insufficient documentation

## 2023-10-26 NOTE — Patient Instructions (Signed)
Iliotibial Band Syndrome Rehab Ask your health care provider which exercises are safe for you. Do exercises exactly as told by your provider and adjust them as told. It's normal to feel mild stretching, pulling, tightness, or discomfort as you do these exercises. Stop right away if you feel sudden pain or your pain gets a lot worse. Do not begin these exercises until told by your provider. Stretching and range-of-motion exercises These exercises warm up your muscles and joints. They also improve the movement and flexibility of your hip and pelvis. Quadriceps stretch, prone  Lie face down (prone) on a firm surface like a bed or padded floor. Bend your left / right knee. Reach back to hold your ankle or pant leg. If you can't reach your ankle or pant leg, use a belt looped around your foot and grab the belt instead. Gently pull your heel toward your butt. Your knee should not slide out to the side. You should feel a stretch in the front of your thigh and knee, also called the quadriceps. Hold this position for __________ seconds. Repeat __________ times. Complete this exercise __________ times a day. Iliotibial band stretch The iliotibial band is a strip of tissue that runs along the outside of your hip down to your knee. Lie on your side with your left / right leg on top. Bend both knees and grab your left / right ankle. Stretch out your bottom arm to help you balance. Slowly bring your top knee back so your thigh goes behind your back. Slowly lower your top leg toward the floor until you feel a gentle stretch on the outside of your left / right hip and thigh. If you don't feel a stretch and your knee won't go farther, place the heel of your other foot on top of your knee and pull your knee down toward the floor with your foot. Hold this position for __________ seconds. Repeat __________ times. Complete this exercise __________ times a day. Strengthening exercises These exercises build strength  and endurance in your hip and pelvis. Endurance means your muscles can keep working even when they're tired. Straight leg raises, side-lying This exercise strengthens the muscles that rotate the leg at the hip and move it away from your body. These muscles are called hip abductors. Lie on your side with your left / right leg on top. Lie so your head, shoulder, hip, and knee line up. You can bend your bottom knee to help you balance. Roll your hips slightly forward so they're stacked directly over each other. Your left / right knee should face forward. Tense the muscles in your outer thigh and hip. Lift your top leg 4-6 inches (10-15 cm) off the ground. Hold this position for __________ seconds. Slowly lower your leg back down to the starting position. Let your muscles fully relax before doing this exercise again. Repeat __________ times. Complete this exercise __________ times a day. Leg raises, prone This exercise strengthens the muscles that move the hips backward. These muscles are called hip extensors. Lie face down (prone) on your bed or a firm surface. You can put a pillow under your hips for comfort and to support your lower back. Bend your left / right knee so your foot points straight up toward the ceiling. Keep the other leg straight and behind you. Squeeze your butt muscles. Lift your left / right thigh off the firm surface. Do not let your back arch. Tense your thigh muscle as hard as you can without having  more knee pain. Hold this position for __________ seconds. Slowly lower your leg to the starting position. Allow your leg to relax all the way. Repeat __________ times. Complete this exercise __________ times a day. Hip hike  Stand sideways on a bottom step. Place your feet so that your left / right leg is on the step, and the other foot is hanging off the side. If you need support for balance, hold onto a railing or wall. Keep your knees straight and your abdomen square,  meaning your hips are level. Then, lift your left / right hip up toward the ceiling. Slowly let your leg that's hanging off the step lower towards the floor. Your foot should get closer to the ground. Do not lean or bend your knees during this movement. Repeat __________ times. Complete this exercise __________ times a day. This information is not intended to replace advice given to you by your health care provider. Make sure you discuss any questions you have with your health care provider. Document Revised: 02/09/2023 Document Reviewed: 02/09/2023 Elsevier Patient Education  2024 [employer].

## 2023-10-27 LAB — CK: Total CK: 59 U/L (ref 29–143)

## 2023-10-27 LAB — COMPLETE METABOLIC PANEL WITH GFR
AG Ratio: 1.1 (calc) (ref 1.0–2.5)
ALT: 16 U/L (ref 6–29)
AST: 23 U/L (ref 10–35)
Albumin: 4.2 g/dL (ref 3.6–5.1)
Alkaline phosphatase (APISO): 126 U/L (ref 37–153)
BUN: 8 mg/dL (ref 7–25)
CO2: 24 mmol/L (ref 20–32)
Calcium: 11.4 mg/dL — ABNORMAL HIGH (ref 8.6–10.4)
Chloride: 105 mmol/L (ref 98–110)
Creat: 0.8 mg/dL (ref 0.50–1.05)
Globulin: 3.9 g/dL — ABNORMAL HIGH (ref 1.9–3.7)
Glucose, Bld: 87 mg/dL (ref 65–99)
Potassium: 4.5 mmol/L (ref 3.5–5.3)
Sodium: 137 mmol/L (ref 135–146)
Total Bilirubin: 0.6 mg/dL (ref 0.2–1.2)
Total Protein: 8.1 g/dL (ref 6.1–8.1)
eGFR: 81 mL/min/{1.73_m2} (ref >=60)

## 2023-10-27 LAB — CBC WITH DIFFERENTIAL/PLATELET
Absolute Lymphocytes: 1472 {cells}/uL (ref 850–3900)
Absolute Monocytes: 499 {cells}/uL (ref 200–950)
Basophils Absolute: 38 {cells}/uL (ref 0–200)
Basophils Relative: 0.6 %
Eosinophils Absolute: 83 {cells}/uL (ref 15–500)
Eosinophils Relative: 1.3 %
HCT: 40.4 % (ref 35.0–45.0)
Hemoglobin: 12.9 g/dL (ref 11.7–15.5)
MCH: 28.2 pg (ref 27.0–33.0)
MCHC: 31.9 g/dL — ABNORMAL LOW (ref 32.0–36.0)
MCV: 88.2 fL (ref 80.0–100.0)
MPV: 13.3 fL — ABNORMAL HIGH (ref 7.5–12.5)
Monocytes Relative: 7.8 %
Neutro Abs: 4307 {cells}/uL (ref 1500–7800)
Neutrophils Relative %: 67.3 %
Platelets: 218 10*3/uL (ref 140–400)
RBC: 4.58 10*6/uL (ref 3.80–5.10)
RDW: 14.2 % (ref 11.0–15.0)
Total Lymphocyte: 23 %
WBC: 6.4 10*3/uL (ref 3.8–10.8)

## 2023-10-27 LAB — SEDIMENTATION RATE: Sed Rate: 53 mm/h — ABNORMAL HIGH (ref 0–30)

## 2023-10-28 NOTE — Progress Notes (Signed)
CK normal, sed rate elevated and stable, calcium remains elevated (due to hyperparathyroidism) CBC is stable.  Please forward lab results to her PCP and to Dr. Sharl Ma (endocrinologist with [facility]).

## 2023-10-29 ENCOUNTER — Other Ambulatory Visit: Payer: Self-pay | Admitting: Family Medicine

## 2023-10-29 DIAGNOSIS — E210 Primary hyperparathyroidism: Secondary | ICD-10-CM

## 2023-12-07 ENCOUNTER — Other Ambulatory Visit: Payer: Self-pay | Admitting: Family Medicine

## 2023-12-07 DIAGNOSIS — E278 Other specified disorders of adrenal gland: Secondary | ICD-10-CM

## 2023-12-10 ENCOUNTER — Other Ambulatory Visit: Payer: Self-pay | Admitting: Family Medicine

## 2023-12-10 DIAGNOSIS — R1010 Upper abdominal pain, unspecified: Secondary | ICD-10-CM

## 2023-12-28 ENCOUNTER — Ambulatory Visit
Admission: RE | Admit: 2023-12-28 | Discharge: 2023-12-28 | Disposition: A | Discharge status: Home / Self Care | Payer: Medicare Other | Source: Ambulatory Visit | Attending: Family Medicine | Admitting: Family Medicine

## 2023-12-28 DIAGNOSIS — R1010 Upper abdominal pain, unspecified: Secondary | ICD-10-CM

## 2024-01-12 DIAGNOSIS — M25373 Other instability, unspecified ankle: Secondary | ICD-10-CM

## 2024-01-12 HISTORY — DX: Other instability, unspecified ankle: M25.373

## 2024-01-15 ENCOUNTER — Encounter: Payer: Self-pay | Admitting: *Deleted

## 2024-02-25 ENCOUNTER — Other Ambulatory Visit: Payer: BLUE CROSS/BLUE SHIELD

## 2024-04-24 ENCOUNTER — Ambulatory Visit: Payer: BLUE CROSS/BLUE SHIELD | Admitting: Rheumatology

## 2024-04-28 ENCOUNTER — Ambulatory Visit
Admission: RE | Admit: 2024-04-28 | Discharge: 2024-04-28 | Disposition: A | Discharge status: Home / Self Care | Source: Ambulatory Visit | Attending: Family Medicine | Admitting: Family Medicine

## 2024-04-28 DIAGNOSIS — E278 Other specified disorders of adrenal gland: Secondary | ICD-10-CM

## 2024-04-28 NOTE — Progress Notes (Signed)
 Office Visit Note  Patient: Meghan Owen             Date of Birth: 1956/12/02           MRN: [id number]             PCP: Victorio Grave, MD Referring: Victorio Grave, MD Visit Date: 05/12/2024 Occupation: @GUAROCC @  Subjective:  Myalgias   History of Present Illness: Meghan Owen is a 68 y.o. female with history of palindromic rheumatism, DDD, and osteoarthritis.   She is not currently taking immunosuppressive agents.  She has not needed to take steroids or over-the-counter products for pain relief recently.  She continues to have intermittent arthralgias particularly involving her lower extremities.  Patient states that she has chronic instability in both ankle joints but is not ready to proceed with surgical intervention.  She is going for a second opinion soon.  She denies any recent falls but has been using a cane to assist with ambulation.  Patient states that most of her symptoms are related to pain in her muscles and connective tissue.  Patient has intermittent myalgias and muscle tenderness due to fibromyalgia.  She continues to have chronic fatigue.   Activities of Daily Living:  Patient reports morning stiffness for 15 minutes.   Patient Reports nocturnal pain.  Difficulty dressing/grooming: Denies Difficulty climbing stairs: Reports Difficulty getting out of chair: Denies Difficulty using hands for taps, buttons, cutlery, and/or writing: Denies  Review of Systems  Constitutional:  Positive for fatigue.  HENT:  Positive for mouth dryness. Negative for mouth sores.   Eyes:  Positive for dryness.  Respiratory:  Positive for shortness of breath.   Cardiovascular:  Positive for palpitations. Negative for chest pain.  Gastrointestinal:  Positive for constipation. Negative for blood in stool and diarrhea.  Endocrine: Positive for increased urination.  Genitourinary:  Negative for involuntary urination.  Musculoskeletal:  Positive for joint pain, gait problem, joint pain,  myalgias, muscle weakness, morning stiffness, muscle tenderness and myalgias. Negative for joint swelling.  Skin:  Positive for color change and hair loss. Negative for rash and sensitivity to sunlight.  Allergic/Immunologic: Negative for susceptible to infections.  Neurological:  Positive for dizziness and headaches.  Hematological:  Negative for swollen glands.  Psychiatric/Behavioral:  Positive for sleep disturbance. Negative for depressed mood. The patient is nervous/anxious.     PMFS History:  Patient Active Problem List   Diagnosis Date Noted   Elevated C-reactive protein (CRP) 06/01/2021   Paresthesia 01/31/2021   Osteoarthritis of both sacroiliac joints (HCC) 01/04/2018   Primary osteoarthritis of both knees 01/04/2018   DDD (degenerative disc disease), cervical 01/04/2018   DDD (degenerative disc disease), lumbar 01/04/2018   Chronic right shoulder pain 01/04/2018   Fibromyalgia 01/04/2018   Hashimoto's thyroiditis 01/04/2018   History of hyperparathyroidism 01/04/2018   History of gastroesophageal reflux (GERD) 01/04/2018   Family history of rheumatoid arthritis 01/04/2018   Cervical myelopathy (HCC) 09/05/2016   Primary osteoarthritis of left hip 01/04/2016   Chronic left hip pain 12/15/2014   Hypercalcemia 09/08/2013   Varus deformity of foot 08/26/2013   Peroneal tendonitis 05/23/2013   Conversion disorder 05/21/2013   Low back pain 11/01/2012   Achilles tendinitis 09/05/2012   Bilateral ankle pain 09/05/2012   Hammer toe, acquired 09/05/2012   Cubital tunnel syndrome on left 08/22/2012   Stress fracture 07/04/2012   Lumbar facet arthropathy 05/10/2012   Diffuse myofascial pain syndrome 01/25/2012   GERD 02/05/2009   OTHER BURSITIS DISORDERS  02/05/2009   ABNORMAL ELECTROCARDIOGRAM 02/05/2009    Past Medical History:  Diagnosis Date   Arthritis    Bursitis, hip    bilateral   Chronic instability of ankle 01/2024   both ankles   DDD (degenerative disc  disease), lumbar    Dyslipidemia    Elevated sed rate    Fatty liver    Fibromyalgia    GERD (gastroesophageal reflux disease)    HX   Hashimoto's thyroiditis    Headache    History of COVID-19 08/2023   Hypercalcemia    Hyperparathyroidism (HCC)    Dr. Christobal Craft   Hypothyroidism    Liver cyst    BENIGN   Obesity    Osteoarthritis    Parathyroid  disorder (HCC)    HYPERPARATHYROID   Paresthesia    Peripheral neuropathy    Spinal stenosis    Synovial cyst of elbow    Thyroid  disease    Tremor    Vitamin D  deficiency     Family History  Problem Relation Age of Onset   Lupus Mother    Rheum arthritis Mother    Dementia Father    Leukemia Father    Cancer Sister        bone    Healthy Daughter    Healthy Son    Healthy Son    Cancer Other    Past Surgical History:  Procedure Laterality Date   ABDOMINAL HYSTERECTOMY     ANKLE SURGERY     ANTERIOR CERVICAL DECOMPRESSION/DISCECTOMY FUSION 4 LEVELS N/A 09/05/2016   Procedure: Cervical three-four Cervical four-five  Cervical five-six Cervical six-seven  Anterior cervical decompression/diskectomy/fusion;  Surgeon: Manya Sells, MD;  Location: [facility];  Service: Neurosurgery;  Laterality: N/A;   CARPAL TUNNEL RELEASE Left 02/26/2018   ELBOW SURGERY  04/2017   cyst removed    FOOT SURGERY     TOE SURGERY Bilateral    Social History   Social History Narrative   Caffeine yes, widowed.  3 children.     There is no immunization history on file for this patient.   Objective: Vital Signs: BP (!) 143/80 (BP Location: Left Arm, Patient Position: Sitting, Cuff Size: Normal)   Pulse 61   Resp 17   Ht 5\' 5"  (1.651 m)   Wt 236 lb 9.6 oz (107.3 kg)   BMI 39.37 kg/m    Physical Exam Vitals and nursing note reviewed.  Constitutional:      Appearance: She is well-developed.  HENT:     Head: Normocephalic and atraumatic.  Eyes:     Conjunctiva/sclera: Conjunctivae normal.  Cardiovascular:     Rate and Rhythm:  Normal rate and regular rhythm.     Heart sounds: Normal heart sounds.  Pulmonary:     Effort: Pulmonary effort is normal.     Breath sounds: Normal breath sounds.  Abdominal:     General: Bowel sounds are normal.     Palpations: Abdomen is soft.  Musculoskeletal:     Cervical back: Normal range of motion.  Lymphadenopathy:     Cervical: No cervical adenopathy.  Skin:    General: Skin is warm and dry.     Capillary Refill: Capillary refill takes less than 2 seconds.  Neurological:     Mental Status: She is alert and oriented to person, place, and time.  Psychiatric:        Behavior: Behavior normal.      Musculoskeletal Exam: C-spine has limited ROM.  Postural thoracic kyphosis noted.  Shoulder joints, elbow joints, wrist joints, MCPs, PIPs, DIPs have good range of motion with no synovitis.  Complete fist formation bilaterally.  Hip joints have good range of motion with no groin pain.  Knee joints have good range of motion no warmth or effusion noted.  Ankle joints have good ROM with no tenderness or joint swelling.    CDAI Exam: CDAI Score: -- Patient Global: --; Provider Global: -- Swollen: --; Tender: -- Joint Exam 05/12/2024   No joint exam has been documented for this visit   There is currently no information documented on the homunculus. Go to the Rheumatology activity and complete the homunculus joint exam.  Investigation: No additional findings.  Imaging: MR ABDOMEN WO CONTRAST Result Date: 05/10/2024 CLINICAL DATA:  Adrenal mass EXAM: MRI ABDOMEN WITHOUT CONTRAST TECHNIQUE: Multiplanar multisequence MR imaging was performed without the administration of intravenous contrast. COMPARISON:  CT abdomen/pelvis dated 10/24/2017 FINDINGS: Lower chest: Lung bases are clear. Hepatobiliary: Liver is within normal limits. Gallbladder is unremarkable. No intrahepatic or extrahepatic ductal dilatation. Pancreas:  Within normal limits. Spleen:  Within normal limits. Adrenals/Urinary  Tract: 3.7 cm right adrenal nodule (series 5/image 15) with signal loss on opposed phase imaging, compatible with a benign adrenal adenoma. No follow-up is recommended. Left adrenal gland is within normal limits. Kidneys are within normal limits.  No hydronephrosis. Stomach/Bowel: Stomach is within normal limits. Visualized bowel is grossly unremarkable. Vascular/Lymphatic:  No evidence of abdominal aortic aneurysm. No suspicious abdominal lymphadenopathy. Other:  No abdominal ascites. Musculoskeletal: No focal osseous lesions. IMPRESSION: 3.7 cm right adrenal adenoma, benign.  No follow-up is recommended. Electronically Signed   By: Zadie Herter M.D.   On: 05/10/2024 00:48    Recent Labs: Lab Results  Component Value Date   WBC 6.4 10/26/2023   HGB 12.9 10/26/2023   PLT 218 10/26/2023   NA 137 10/26/2023   K 4.5 10/26/2023   CL 105 10/26/2023   CO2 24 10/26/2023   GLUCOSE 87 10/26/2023   BUN 8 10/26/2023   CREATININE 0.80 10/26/2023   BILITOT 0.6 10/26/2023   ALKPHOS 112 05/25/2021   AST 23 10/26/2023   ALT 16 10/26/2023   PROT 8.1 10/26/2023   ALBUMIN  4.0 05/25/2021   CALCIUM 11.4 (H) 10/26/2023   GFRAA 102 08/20/2019    Speciality Comments: No specialty comments available.  Procedures:  No procedures performed Allergies: Aspirin, Penicillin v potassium, Caffeine, Iodinated contrast media, Iodine-131, Ioxaglate, Penicillins, and Prednisone   Assessment / Plan:     Visit Diagnoses: Palindromic rheumatism - September 12, 2022 AVISE Lupus index -2.7. anti-carP positive, antihistone negative, anti-TPO positive, antithyroglobulin positive: She has no synovitis on examination today.  She has not had any signs or symptoms of a flare.  She does not require immunosuppressive therapy at this time.  Patient has chronically elevated sed rate but has no active inflammation at this time.  Plan to recheck sed rate and CRP today.  She will notify us  if she develops any new or worsening symptoms.   She will follow-up in the office in 6 months or sooner if needed.  - Plan: Sedimentation rate, CBC with Differential/Platelet, Comprehensive metabolic panel with GFR  Pain in both hands: No tenderness or synovitis noted on exam.  Complete fist formation bilaterally.  Trochanteric bursitis of both hips: Not currently symptomatic.  Chronic pain of both hips: Left hip has slightly limited range of motion.  She has intermittent discomfort and stiffness in both hips.  She has been using a  cane to assist with ambulation.  No recent falls.  Primary osteoarthritis of both knees: She has good range of motion of both knee joints on examination today.  No warmth or effusion noted.  Pain in both feet - X-rays of both feet consistent with osteoarthritis on 03/28/21.  Patient has chronic instability in both ankle joints.  She has been under the care of podiatry but will be going for a second opinion.  She is not yet ready to proceed with surgical intervention.  She has been using a cane to assist with ambulation.  She has tried physical therapy in the past.  DDD (degenerative disc disease), cervical - recent MRI by Dr. Penumalli-no radiographic progression when compared to the previous MRI.S/p fusion-Dr. Nigel Bart 2017:EMG on 02/21/21:LUE unremarkable.  C-spine has limited range of motion. No symptoms of radiculopathy.   Spondylosis of lumbar spine - X-rays of the lumbar spine were consistent with facet joint arthropathy and mild dextroscoliosis. MRI of lumbar spine performed on 12/20/20.  No symptoms of radiculopathy.   Osteoarthritis of both sacroiliac joints (HCC) - Chronic pain.  HLA-B27 negative, MRI negative for sacroiliitis. MRI on 01/01/2018 revealed chronic fusion of superior aspect of right SI joint.  No new or worsening symptoms.  Other fatigue - Chronic fatigue and easy bruising.  Plan to obtain the following lab work today.  Plan: Sedimentation rate, Vitamin B12, TSH, CBC with Differential/Platelet,  Comprehensive metabolic panel with GFR  Vitamin B12 deficiency - Fatigue.  Patient requested vitamin B12 to be updated today.  Plan: Vitamin B12  Elevated sed rate -Chronically elevated sed rate.  Plan to recheck ESR and CRP today.   Plan: Sedimentation rate, C-reactive protein  Dry eyes:Chronic dry eyes   Dry mouth: Chronic dry mouth   Fibromyalgia: Generalized myalgias and muscle tenderness.  Discussed importance of regular exercise and good sleep hygiene.  She has been trying to stretch on a daily basis.    Other medical conditions are listed as follows:   Family history of rheumatoid arthritis  Hashimoto's thyroiditis -Plan to update TSH level today as requested.  Plan: TSH  History of gastroesophageal reflux (GERD)  History of hyperparathyroidism - Plan: Comprehensive metabolic panel with GFR   Orders: Orders Placed This Encounter  Procedures   Sedimentation rate   Vitamin B12   TSH   CBC with Differential/Platelet   Comprehensive metabolic panel with GFR   C-reactive protein   No orders of the defined types were placed in this encounter.   Follow-Up Instructions: Return in about 6 months (around 11/11/2024) for Palindromic rheumatism .   Romayne Clubs, PA-C  Note - This record has been created using Dragon software.  Chart creation errors have been sought, but may not always  have been located. Such creation errors do not reflect on  the standard of medical care.

## 2024-05-12 ENCOUNTER — Encounter: Payer: Self-pay | Admitting: Physician Assistant

## 2024-05-12 ENCOUNTER — Ambulatory Visit: Attending: Physician Assistant | Admitting: Physician Assistant

## 2024-05-12 VITALS — BP 136/79 | HR 62 | Resp 17 | Ht 65.0 in | Wt 236.6 lb

## 2024-05-12 DIAGNOSIS — M5030 Other cervical disc degeneration, unspecified cervical region: Secondary | ICD-10-CM | POA: Diagnosis present

## 2024-05-12 DIAGNOSIS — M79671 Pain in right foot: Secondary | ICD-10-CM | POA: Diagnosis present

## 2024-05-12 DIAGNOSIS — M47816 Spondylosis without myelopathy or radiculopathy, lumbar region: Secondary | ICD-10-CM

## 2024-05-12 DIAGNOSIS — E538 Deficiency of other specified B group vitamins: Secondary | ICD-10-CM

## 2024-05-12 DIAGNOSIS — R682 Dry mouth, unspecified: Secondary | ICD-10-CM

## 2024-05-12 DIAGNOSIS — G8929 Other chronic pain: Secondary | ICD-10-CM

## 2024-05-12 DIAGNOSIS — M170 Bilateral primary osteoarthritis of knee: Secondary | ICD-10-CM

## 2024-05-12 DIAGNOSIS — R700 Elevated erythrocyte sedimentation rate: Secondary | ICD-10-CM

## 2024-05-12 DIAGNOSIS — M7062 Trochanteric bursitis, left hip: Secondary | ICD-10-CM | POA: Diagnosis present

## 2024-05-12 DIAGNOSIS — E063 Autoimmune thyroiditis: Secondary | ICD-10-CM

## 2024-05-12 DIAGNOSIS — Z8639 Personal history of other endocrine, nutritional and metabolic disease: Secondary | ICD-10-CM | POA: Diagnosis present

## 2024-05-12 DIAGNOSIS — R5383 Other fatigue: Secondary | ICD-10-CM | POA: Diagnosis present

## 2024-05-12 DIAGNOSIS — M7061 Trochanteric bursitis, right hip: Secondary | ICD-10-CM | POA: Diagnosis present

## 2024-05-12 DIAGNOSIS — M25551 Pain in right hip: Secondary | ICD-10-CM

## 2024-05-12 DIAGNOSIS — Z8261 Family history of arthritis: Secondary | ICD-10-CM

## 2024-05-12 DIAGNOSIS — M797 Fibromyalgia: Secondary | ICD-10-CM

## 2024-05-12 DIAGNOSIS — M79672 Pain in left foot: Secondary | ICD-10-CM | POA: Diagnosis present

## 2024-05-12 DIAGNOSIS — H04123 Dry eye syndrome of bilateral lacrimal glands: Secondary | ICD-10-CM

## 2024-05-12 DIAGNOSIS — M79641 Pain in right hand: Secondary | ICD-10-CM | POA: Diagnosis present

## 2024-05-12 DIAGNOSIS — M25552 Pain in left hip: Secondary | ICD-10-CM | POA: Diagnosis present

## 2024-05-12 DIAGNOSIS — Z8719 Personal history of other diseases of the digestive system: Secondary | ICD-10-CM

## 2024-05-12 DIAGNOSIS — M79642 Pain in left hand: Secondary | ICD-10-CM

## 2024-05-12 DIAGNOSIS — M461 Sacroiliitis, not elsewhere classified: Secondary | ICD-10-CM | POA: Diagnosis present

## 2024-05-12 DIAGNOSIS — M1230 Palindromic rheumatism, unspecified site: Secondary | ICD-10-CM

## 2024-05-13 ENCOUNTER — Ambulatory Visit: Payer: Self-pay | Admitting: Physician Assistant

## 2024-05-13 LAB — CBC WITH DIFFERENTIAL/PLATELET
Absolute Lymphocytes: 1645 {cells}/uL (ref 850–3900)
Absolute Monocytes: 462 {cells}/uL (ref 200–950)
Basophils Absolute: 39 {cells}/uL (ref 0–200)
Basophils Relative: 0.6 %
Eosinophils Absolute: 111 {cells}/uL (ref 15–500)
Eosinophils Relative: 1.7 %
HCT: 41.7 % (ref 35.0–45.0)
Hemoglobin: 13.4 g/dL (ref 11.7–15.5)
MCH: 27.8 pg (ref 27.0–33.0)
MCHC: 32.1 g/dL (ref 32.0–36.0)
MCV: 86.5 fL (ref 80.0–100.0)
MPV: 13 fL — ABNORMAL HIGH (ref 7.5–12.5)
Monocytes Relative: 7.1 %
Neutro Abs: 4245 {cells}/uL (ref 1500–7800)
Neutrophils Relative %: 65.3 %
Platelets: 219 10*3/uL (ref 140–400)
RBC: 4.82 10*6/uL (ref 3.80–5.10)
RDW: 14.1 % (ref 11.0–15.0)
Total Lymphocyte: 25.3 %
WBC: 6.5 10*3/uL (ref 3.8–10.8)

## 2024-05-13 LAB — COMPREHENSIVE METABOLIC PANEL WITH GFR
AG Ratio: 1.2 (calc) (ref 1.0–2.5)
ALT: 14 U/L (ref 6–29)
AST: 17 U/L (ref 10–35)
Albumin: 4.6 g/dL (ref 3.6–5.1)
Alkaline phosphatase (APISO): 116 U/L (ref 37–153)
BUN: 7 mg/dL (ref 7–25)
CO2: 26 mmol/L (ref 20–32)
Calcium: 10.8 mg/dL — ABNORMAL HIGH (ref 8.6–10.4)
Chloride: 105 mmol/L (ref 98–110)
Creat: 0.81 mg/dL (ref 0.50–1.05)
Globulin: 3.9 g/dL — ABNORMAL HIGH (ref 1.9–3.7)
Glucose, Bld: 88 mg/dL (ref 65–99)
Potassium: 4.3 mmol/L (ref 3.5–5.3)
Sodium: 139 mmol/L (ref 135–146)
Total Bilirubin: 0.6 mg/dL (ref 0.2–1.2)
Total Protein: 8.5 g/dL — ABNORMAL HIGH (ref 6.1–8.1)
eGFR: 80 mL/min/{1.73_m2} (ref >=60)

## 2024-05-13 LAB — SEDIMENTATION RATE: Sed Rate: 51 mm/h — ABNORMAL HIGH (ref 0–30)

## 2024-05-13 LAB — VITAMIN B12: Vitamin B-12: 455 pg/mL (ref 200–1100)

## 2024-05-13 LAB — C-REACTIVE PROTEIN: CRP: 5.6 mg/L (ref <8.0)

## 2024-05-13 LAB — TSH: TSH: 1.1 m[IU]/L (ref 0.40–4.50)

## 2024-05-13 NOTE — Progress Notes (Signed)
 CRP WNL

## 2024-05-13 NOTE — Progress Notes (Signed)
 CBC stable.  ESR remains elevated but continue to trend down.   Calcium remains elevated but has improved.  Do not recommend taking a calcium or vitamin D  supplement at this time.   Vitamin B12-WNL-455. TSH WNL

## 2024-06-12 ENCOUNTER — Encounter (HOSPITAL_COMMUNITY): Payer: Self-pay

## 2024-06-12 ENCOUNTER — Other Ambulatory Visit: Payer: Self-pay

## 2024-06-12 ENCOUNTER — Emergency Department (HOSPITAL_COMMUNITY)

## 2024-06-12 ENCOUNTER — Emergency Department (HOSPITAL_COMMUNITY)
Admission: EM | Admit: 2024-06-12 | Discharge: 2024-06-12 | Disposition: A | Discharge status: Home / Self Care | Attending: Emergency Medicine | Admitting: Emergency Medicine

## 2024-06-12 DIAGNOSIS — R079 Chest pain, unspecified: Secondary | ICD-10-CM | POA: Diagnosis not present

## 2024-06-12 DIAGNOSIS — E039 Hypothyroidism, unspecified: Secondary | ICD-10-CM | POA: Insufficient documentation

## 2024-06-12 DIAGNOSIS — M62838 Other muscle spasm: Secondary | ICD-10-CM | POA: Insufficient documentation

## 2024-06-12 LAB — CBC WITH DIFFERENTIAL/PLATELET
Abs Immature Granulocytes: 0.02 10*3/uL (ref 0.00–0.07)
Basophils Absolute: 0 10*3/uL (ref 0.0–0.1)
Basophils Relative: 0 %
Eosinophils Absolute: 0.1 10*3/uL (ref 0.0–0.5)
Eosinophils Relative: 1 %
HCT: 40.1 % (ref 36.0–46.0)
Hemoglobin: 12.8 g/dL (ref 12.0–15.0)
Immature Granulocytes: 0 %
Lymphocytes Relative: 22 %
Lymphs Abs: 2.2 10*3/uL (ref 0.7–4.0)
MCH: 27.8 pg (ref 26.0–34.0)
MCHC: 31.9 g/dL (ref 30.0–36.0)
MCV: 87.2 fL (ref 80.0–100.0)
Monocytes Absolute: 0.8 10*3/uL (ref 0.1–1.0)
Monocytes Relative: 8 %
Neutro Abs: 6.7 10*3/uL (ref 1.7–7.7)
Neutrophils Relative %: 69 %
Platelets: 232 10*3/uL (ref 150–400)
RBC: 4.6 MIL/uL (ref 3.87–5.11)
RDW: 15.6 % — ABNORMAL HIGH (ref 11.5–15.5)
WBC: 9.7 10*3/uL (ref 4.0–10.5)
nRBC: 0 % (ref 0.0–0.2)

## 2024-06-12 LAB — COMPREHENSIVE METABOLIC PANEL WITH GFR
ALT: 15 U/L (ref 0–44)
AST: 17 U/L (ref 15–41)
Albumin: 3.7 g/dL (ref 3.5–5.0)
Alkaline Phosphatase: 104 U/L (ref 38–126)
Anion gap: 9 (ref 5–15)
BUN: 15 mg/dL (ref 8–23)
CO2: 23 mmol/L (ref 22–32)
Calcium: 10.4 mg/dL — ABNORMAL HIGH (ref 8.9–10.3)
Chloride: 105 mmol/L (ref 98–111)
Creatinine, Ser: 0.88 mg/dL (ref 0.44–1.00)
GFR, Estimated: >60 mL/min (ref >60)
Glucose, Bld: 82 mg/dL (ref 70–99)
Potassium: 3.9 mmol/L (ref 3.5–5.1)
Sodium: 137 mmol/L (ref 135–145)
Total Bilirubin: 0.8 mg/dL (ref 0.0–1.2)
Total Protein: 8.2 g/dL — ABNORMAL HIGH (ref 6.5–8.1)

## 2024-06-12 LAB — TROPONIN I (HIGH SENSITIVITY)
Troponin I (High Sensitivity): <2 ng/L (ref <18)
Troponin I (High Sensitivity): <2 ng/L (ref <18)

## 2024-06-12 MED ORDER — METHOCARBAMOL 500 MG PO TABS: 500.0000 mg | ORAL_TABLET | Freq: Two times a day (BID) | ORAL | 0 refills | Status: AC

## 2024-06-12 NOTE — ED Triage Notes (Signed)
 Pt arrived via POV c/o of chest pain and numbness that radiates down her neck and arm.

## 2024-06-12 NOTE — Discharge Instructions (Signed)
 Your lab tests and x-rays and EKG are all reassuring today.  Your exam suggests that your pain is from a strain or spasm of your neck muscle.  I am prescribing you a muscle relaxer, use caution with this medicine as it can make you little sleepy.  Also recommend heating pad applied to your neck and shoulder area for 15 to 20 minutes several times daily.  Follow-up with your primary doctor if this does not improve your symptoms.

## 2024-06-12 NOTE — ED Provider Notes (Signed)
 [place] EMERGENCY DEPARTMENT AT [facility] Provider Note   CSN: [id number] Arrival date & time: 06/12/24  9070     Patient presents with: Chest Pain   Meghan Owen is a 68 y.o. female with a history including spinal stenosis, hypothyroidism secondary to parathyroid  disorder, history of peripheral neuropathy of upper extremities, GERD, dyslipidemia presenting for evaluation of chest and neck pain.  She has a history of some chronic neck pain after undergoing cervical decompression fusion at the L4 level in 2017 and endorses increased pain along her left lateral neck over the past several days with radiation into her arm.  This morning in addition to this pain she also noticed radiation into her left mid chest.  She denies nausea vomiting, shortness of breath, diaphoresis or palpitations.  She was sitting in her home when the chest pain started.  She is unsure whether her chest pain is related to her chronic neck pain, however it is worsened with ranging her head over her right shoulder.  She endorses chronic left upper extremity weakness.  She has had no treatment prior to arrival.  She does states she has been under the care of Dr. Rockney for her neck issues, she last had an MRI of her C-spine this winter with no surgical findings.   The history is provided by the patient.       Prior to Admission medications   Medication Sig Start Date End Date Taking? Authorizing Provider  methocarbamol  (ROBAXIN ) 500 MG tablet Take 1 tablet (500 mg total) by mouth 2 (two) times daily. 06/12/24  Yes Kalai Baca, PA-C  b complex vitamins tablet Take 1 tablet by mouth daily.    [provider]  Cholecalciferol  (VITAMIN D3) 5000 UNITS CAPS Take 5,000 Units by mouth daily.     [provider]  Digestive Enzymes (DIGESTIVE ENZYME PO) Take by mouth.    [provider]  MAGNESIUM  PO Take by mouth daily.    [provider]  Menatetrenone (VITAMIN K2) 100 MCG  TABS See admin instructions.    [provider]  Probiotic Product (PROBIOTIC PO) Take 1 tablet by mouth daily.    [provider]  TURMERIC PO Take 1,500 mg by mouth as needed.    [provider]  UNABLE TO FIND Med Name: Curamin 1 tablet as needed 350mg     [provider]    Allergies: Aspirin, Penicillin v potassium, Caffeine, Iodinated contrast media, Iodine-131, Ioxaglate, Penicillins, and Prednisone    Review of Systems  Constitutional:  Negative for chills and fever.  HENT:  Negative for congestion and sore throat.   Eyes: Negative.   Respiratory:  Negative for chest tightness and shortness of breath.   Cardiovascular:  Positive for chest pain.  Gastrointestinal:  Negative for abdominal pain and nausea.  Genitourinary: Negative.   Musculoskeletal:  Positive for neck pain. Negative for arthralgias and joint swelling.  Skin: Negative.  Negative for rash and wound.  Neurological:  Positive for weakness. Negative for dizziness, light-headedness, numbness and headaches.       Chronic LUE weakness  Psychiatric/Behavioral: Negative.      Updated Vital Signs BP 121/67   Pulse (!) 56   Temp 98.3 F (36.8 C) (Oral)   Resp 18   Ht 5' 5 (1.651 m)   Wt 107.3 kg   SpO2 100%   BMI 39.36 kg/m   Physical Exam Vitals and nursing note reviewed.  Constitutional:      Appearance: She  is well-developed.  HENT:     Head: Normocephalic and atraumatic.  Eyes:     Conjunctiva/sclera: Conjunctivae normal.  Neck:     Trachea: Trachea normal.     Comments: Tender to palpation along the left SCM.  No bruit. Cardiovascular:     Rate and Rhythm: Normal rate and regular rhythm.     Heart sounds: Normal heart sounds. No murmur heard. Pulmonary:     Effort: Pulmonary effort is normal.     Breath sounds: Normal breath sounds. No wheezing.  Abdominal:     General: Bowel sounds are normal.     Palpations: Abdomen is soft.     Tenderness: There is no  abdominal tenderness.  Musculoskeletal:        General: Normal range of motion.     Cervical back: Normal range of motion. No rigidity. Pain with movement present. No spinous process tenderness.  Lymphadenopathy:     Cervical: No cervical adenopathy.  Skin:    General: Skin is warm and dry.  Neurological:     Mental Status: She is alert and oriented to person, place, and time.     Cranial Nerves: No cranial nerve deficit.     Sensory: No sensory deficit.     Comments: 5/5 right grip,  4/5 left     (all labs ordered are listed, but only abnormal results are displayed) Labs Reviewed  CBC WITH DIFFERENTIAL/PLATELET - Abnormal; Notable for the following components:      Result Value   RDW 15.6 (*)    All other components within normal limits  COMPREHENSIVE METABOLIC PANEL WITH GFR - Abnormal; Notable for the following components:   Calcium 10.4 (*)    Total Protein 8.2 (*)    All other components within normal limits  TROPONIN I (HIGH SENSITIVITY)  TROPONIN I (HIGH SENSITIVITY)    EKG: EKG Interpretation Date/Time:  Thursday June 12 2024 09:47:45 EDT Ventricular Rate:  57 PR Interval:  127 QRS Duration:  91 QT Interval:  436 QTC Calculation: 425 R Axis:   12  Text Interpretation: Sinus rhythm Low voltage, precordial leads No significant change since last tracing Confirmed by Dean Clarity [phone]) on 06/12/2024 10:41:39 AM  Radiology: No results found. Results for orders placed or performed during the hospital encounter of 06/12/24  CBC with Differential   Collection Time: 06/12/24 10:30 AM  Result Value Ref Range   WBC 9.7 4.0 - 10.5 K/uL   RBC 4.60 3.87 - 5.11 MIL/uL   Hemoglobin 12.8 12.0 - 15.0 g/dL   HCT 59.8 63.9 - 53.9 %   MCV 87.2 80.0 - 100.0 fL   MCH 27.8 26.0 - 34.0 pg   MCHC 31.9 30.0 - 36.0 g/dL   RDW 84.3 (H) 88.4 - 84.4 %   Platelets 232 150 - 400 K/uL   nRBC 0.0 0.0 - 0.2 %   Neutrophils Relative % 69 %   Neutro Abs 6.7 1.7 - 7.7 K/uL    Lymphocytes Relative 22 %   Lymphs Abs 2.2 0.7 - 4.0 K/uL   Monocytes Relative 8 %   Monocytes Absolute 0.8 0.1 - 1.0 K/uL   Eosinophils Relative 1 %   Eosinophils Absolute 0.1 0.0 - 0.5 K/uL   Basophils Relative 0 %   Basophils Absolute 0.0 0.0 - 0.1 K/uL   Immature Granulocytes 0 %   Abs Immature Granulocytes 0.02 0.00 - 0.07 K/uL  Comprehensive metabolic panel   Collection Time: 06/12/24 10:30 AM  Result Value Ref  Range   Sodium 137 135 - 145 mmol/L   Potassium 3.9 3.5 - 5.1 mmol/L   Chloride 105 98 - 111 mmol/L   CO2 23 22 - 32 mmol/L   Glucose, Bld 82 70 - 99 mg/dL   BUN 15 8 - 23 mg/dL   Creatinine, Ser 9.11 0.44 - 1.00 mg/dL   Calcium 89.5 (H) 8.9 - 10.3 mg/dL   Total Protein 8.2 (H) 6.5 - 8.1 g/dL   Albumin  3.7 3.5 - 5.0 g/dL   AST 17 15 - 41 U/L   ALT 15 0 - 44 U/L   Alkaline Phosphatase 104 38 - 126 U/L   Total Bilirubin 0.8 0.0 - 1.2 mg/dL   GFR, Estimated >39 >39 mL/min   Anion gap 9 5 - 15  Troponin I (High Sensitivity)   Collection Time: 06/12/24 10:30 AM  Result Value Ref Range   Troponin I (High Sensitivity) <2 <18 ng/L  Troponin I (High Sensitivity)   Collection Time: 06/12/24 12:45 PM  Result Value Ref Range   Troponin I (High Sensitivity) <2 <18 ng/L   DG Chest Port 1 View Result Date: 06/12/2024 CLINICAL DATA:  Chest pain EXAM: PORTABLE CHEST 1 VIEW COMPARISON:  08/05/2010 FINDINGS: Cardiac shadow is within normal limits. Lungs are well aerated. No focal infiltrate or effusion is seen. No bony abnormality is noted. Postsurgical changes are seen in the cervical spine. IMPRESSION: No active disease. Electronically Signed   By: Oneil Devonshire M.D.   On: 06/12/2024 10:24     Procedures   Medications Ordered in the ED - No data to display                                  Medical Decision Making Patient presenting with chronic neck pain now with radiation of pain into her left chest which is reproducible, especially with head rotation and palpation along  her left SCM .  Differential diagnosis including musculoskeletal source, torticollis, cervical radiculopathy, ACS.  With negative delta troponins and EKG, exam suggesting musculoskeletal source, patient is placed on Robaxin , also discussed other home treatment including heat therapy, advised follow-up with PCP as needed  Amount and/or Complexity of Data Reviewed Labs: ordered.    Details: Labs are reassuring with negative delta troponins, c-Met and CBC also reviewed and are negative. Radiology: ordered.    Details: Chest x-ray is negative for cardiopulmonary disease.  Films reviewed, agree with interpretation ECG/medicine tests: ordered.    Details: Normal sinus rhythm rate 57  Risk Prescription drug management.        Final diagnoses:  Muscle spasms of neck    ED Discharge Orders          Ordered    methocarbamol  (ROBAXIN ) 500 MG tablet  2 times daily        06/12/24 1342               Toby Ayad, PA-C 06/14/24 2038    Dean Clarity, MD 06/15/24 [phone]

## 2024-06-19 ENCOUNTER — Ambulatory Visit (HOSPITAL_BASED_OUTPATIENT_CLINIC_OR_DEPARTMENT_OTHER)
Admission: RE | Admit: 2024-06-19 | Discharge: 2024-06-19 | Disposition: A | Discharge status: Home / Self Care | Source: Ambulatory Visit | Attending: Family Medicine | Admitting: Family Medicine

## 2024-06-19 ENCOUNTER — Other Ambulatory Visit: Payer: BLUE CROSS/BLUE SHIELD

## 2024-06-19 DIAGNOSIS — E210 Primary hyperparathyroidism: Secondary | ICD-10-CM | POA: Diagnosis present

## 2024-07-10 ENCOUNTER — Other Ambulatory Visit: Payer: Self-pay | Admitting: Neurological Surgery

## 2024-07-10 DIAGNOSIS — M5412 Radiculopathy, cervical region: Secondary | ICD-10-CM

## 2024-07-23 ENCOUNTER — Other Ambulatory Visit: Payer: Self-pay | Admitting: Surgery

## 2024-07-23 DIAGNOSIS — E210 Primary hyperparathyroidism: Secondary | ICD-10-CM

## 2024-08-09 ENCOUNTER — Inpatient Hospital Stay: Admission: RE | Admit: 2024-08-09 | Source: Ambulatory Visit

## 2024-08-16 ENCOUNTER — Other Ambulatory Visit

## 2024-08-27 NOTE — Progress Notes (Unsigned)
 Office Visit Note  Patient: Meghan Owen             Date of Birth: [birth year]           MRN: [id number]             PCP: Teresa Channel, MD Referring: Teresa Channel, MD Visit Date: 09/10/2024 Occupation: Data Unavailable  Subjective:  Fatigue   History of Present Illness: Meghan Owen is a 68 y.o. female with history of palindromic rheumatism and osteoarthritis.  Patient presents today experiencing increased fatigue, myalgias, muscle fatigue, and arthralgias.  Patient states that she has been experiencing intermittent pain in both hands as well as intermittent warmth in both knees.  She has difficulty walking longer than 5 minutes. She remains under the care of podiatry.  She remains under the care of endocrinology and is undergoing a workup for cushings.  Patient states that her blood pressure has been labile and she has noticed increased weakness.  Activities of Daily Living:  Patient reports morning stiffness for sometimes more than 5 minutes.   Patient Reports nocturnal pain.  Difficulty dressing/grooming: Denies Difficulty climbing stairs: Reports Difficulty getting out of chair: Reports Difficulty using hands for taps, buttons, cutlery, and/or writing: Reports  Review of Systems  Constitutional:  Positive for fatigue.  HENT:  Positive for mouth sores. Negative for mouth dryness.   Respiratory:  Positive for shortness of breath.   Cardiovascular:  Positive for palpitations. Negative for chest pain.  Gastrointestinal:  Positive for constipation. Negative for blood in stool and diarrhea.  Endocrine: Positive for increased urination.  Genitourinary:  Negative for involuntary urination.  Musculoskeletal:  Positive for joint pain, gait problem, joint pain, joint swelling, myalgias, muscle weakness, morning stiffness, muscle tenderness and myalgias.  Skin:  Positive for color change and hair loss. Negative for rash and sensitivity to sunlight.  Allergic/Immunologic: Negative  for susceptible to infections.  Neurological:  Positive for dizziness and headaches.  Hematological:  Negative for swollen glands.  Psychiatric/Behavioral:  Positive for sleep disturbance. Negative for depressed mood. The patient is nervous/anxious.     PMFS History:  Patient Active Problem List   Diagnosis Date Noted   Elevated C-reactive protein (CRP) 06/01/2021   Paresthesia 01/31/2021   Osteoarthritis of both sacroiliac joints 01/04/2018   Primary osteoarthritis of both knees 01/04/2018   DDD (degenerative disc disease), cervical 01/04/2018   DDD (degenerative disc disease), lumbar 01/04/2018   Chronic right shoulder pain 01/04/2018   Fibromyalgia 01/04/2018   Hashimoto's thyroiditis 01/04/2018   History of hyperparathyroidism 01/04/2018   History of gastroesophageal reflux (GERD) 01/04/2018   Family history of rheumatoid arthritis 01/04/2018   Cervical myelopathy (HCC) 09/05/2016   Primary osteoarthritis of left hip 01/04/2016   Chronic left hip pain 12/15/2014   Hypercalcemia 09/08/2013   Varus deformity of foot 08/26/2013   Peroneal tendonitis 05/23/2013   Conversion disorder 05/21/2013   Low back pain 11/01/2012   Achilles tendinitis 09/05/2012   Bilateral ankle pain 09/05/2012   Hammer toe, acquired 09/05/2012   Cubital tunnel syndrome on left 08/22/2012   Stress fracture 07/04/2012   Lumbar facet arthropathy 05/10/2012   Diffuse myofascial pain syndrome 01/25/2012   GERD 02/05/2009   OTHER BURSITIS DISORDERS 02/05/2009   ABNORMAL ELECTROCARDIOGRAM 02/05/2009    Past Medical History:  Diagnosis Date   Arthritis    Bursitis, hip    bilateral   Chronic instability of ankle 01/2024   both ankles   DDD (degenerative disc disease), lumbar  Dyslipidemia    Elevated sed rate    Fatty liver    Fibromyalgia    GERD (gastroesophageal reflux disease)    HX   Hashimoto's thyroiditis    Headache    History of COVID-19 08/2023   Hypercalcemia     Hyperparathyroidism    Dr. Mirna   Hypothyroidism    Liver cyst    BENIGN   Obesity    Osteoarthritis    Parathyroid  disorder    HYPERPARATHYROID   Paresthesia    Peripheral neuropathy    Spinal stenosis    Synovial cyst of elbow    Thyroid  disease    Tremor    Vitamin D  deficiency     Family History  Problem Relation Age of Onset   Lupus Mother    Rheum arthritis Mother    Dementia Father    Leukemia Father    Cancer Sister        bone    Healthy Daughter    Healthy Son    Healthy Son    Cancer Other    Past Surgical History:  Procedure Laterality Date   ABDOMINAL HYSTERECTOMY     ANKLE SURGERY     ANTERIOR CERVICAL DECOMPRESSION/DISCECTOMY FUSION 4 LEVELS N/A 09/05/2016   Procedure: Cervical three-four Cervical four-five  Cervical five-six Cervical six-seven  Anterior cervical decompression/diskectomy/fusion;  Surgeon: Fairy Levels, MD;  Location: [facility];  Service: Neurosurgery;  Laterality: N/A;   CARPAL TUNNEL RELEASE Left 02/26/2018   ELBOW SURGERY  04/2017   cyst removed    FOOT SURGERY     TOE SURGERY Bilateral    Social History   Tobacco Use   Smoking status: Former    Current packs/day: 0.00    Types: Cigarettes    Start date: 35    Quit date: 1998    Years since quitting: 27.7    Passive exposure: Never   Smokeless tobacco: Never   Tobacco comments:    1 pack per week  Vaping Use   Vaping status: Never Used  Substance Use Topics   Alcohol use: No   Drug use: No   Social History   Social History Narrative   Caffeine yes, widowed.  3 children.       There is no immunization history on file for this patient.   Objective: Vital Signs: BP 129/82   Pulse 62   Temp 97.9 F (36.6 C)   Resp 16   Ht 5' 5 (1.651 m)   Wt 245 lb 12.8 oz (111.5 kg)   BMI 40.90 kg/m    Physical Exam Vitals and nursing note reviewed.  Constitutional:      Appearance: She is well-developed.  HENT:     Head: Normocephalic and atraumatic.   Eyes:     Conjunctiva/sclera: Conjunctivae normal.  Cardiovascular:     Rate and Rhythm: Normal rate and regular rhythm.     Heart sounds: Normal heart sounds.  Pulmonary:     Effort: Pulmonary effort is normal.     Breath sounds: Normal breath sounds.  Abdominal:     General: Bowel sounds are normal.     Palpations: Abdomen is soft.  Musculoskeletal:     Cervical back: Normal range of motion.  Lymphadenopathy:     Cervical: No cervical adenopathy.  Skin:    General: Skin is warm and dry.     Capillary Refill: Capillary refill takes less than 2 seconds.  Neurological:     Mental Status: She is  alert and oriented to person, place, and time.  Psychiatric:        Behavior: Behavior normal.      Musculoskeletal Exam: C-spine has good range of motion.  Thoracic kyphosis noted.  No midline spinal tenderness.  No SI joint tenderness.  Shoulder joints, elbow joints, wrist joints, MCPs, PIPs, DIPs have good range of motion with no synovitis.  Complete fist formation bilaterally.  Hip joints have good range of motion with no groin pain.  Knee joints have good range of motion no warmth or effusion.  Ankle joints have good range of motion no tenderness or joint swelling.  No evidence of Achilles tendinitis or plantar fasciitis.   CDAI Exam: CDAI Score: -- Patient Global: --; Provider Global: -- Swollen: --; Tender: -- Joint Exam 09/10/2024   No joint exam has been documented for this visit   There is currently no information documented on the homunculus. Go to the Rheumatology activity and complete the homunculus joint exam.  Investigation: No additional findings.  Imaging: No results found.  Recent Labs: Lab Results  Component Value Date   WBC 9.7 06/12/2024   HGB 12.8 06/12/2024   PLT 232 06/12/2024   NA 137 06/12/2024   K 3.9 06/12/2024   CL 105 06/12/2024   CO2 23 06/12/2024   GLUCOSE 82 06/12/2024   BUN 15 06/12/2024   CREATININE 0.88 06/12/2024   BILITOT 0.8  06/12/2024   ALKPHOS 104 06/12/2024   AST 17 06/12/2024   ALT 15 06/12/2024   PROT 8.2 (H) 06/12/2024   ALBUMIN  3.7 06/12/2024   CALCIUM 10.4 (H) 06/12/2024   GFRAA 102 08/20/2019    Speciality Comments: No specialty comments available.  Procedures:  No procedures performed Allergies: Aspirin, Penicillin v potassium, Caffeine, Iodinated contrast media, Iodine-131, Ioxaglate, Penicillins, and Prednisone     Assessment / Plan:     Visit Diagnoses: Palindromic rheumatism - September 12, 2022 AVISE Lupus index -2.7. anti-carP positive, antihistone negative, anti-TPO positive, antithyroglobulin positive: Patient presents today with ongoing arthralgias and joint stiffness.  She has noticed increased pain in both hands intermittently as well as warmth in both knees. She is not currently taking immunosuppressive agents. No synovitis was noted on examination today. Plan to obtain the following lab work today for further evaluation.  Discussed that we can proceed with an ultrasound to assess for synovitis pending lab results.  - Plan: Sedimentation rate, C-reactive protein, Rheumatoid factor, Cyclic citrul peptide antibody, IgG, Mutated Citrullinated Vimentin (MCV) Antibody, CK, Comprehensive metabolic panel with GFR, CBC with Differential/Platelet, ANA, RNP Antibody, Anti-Smith antibody, Anti-scleroderma antibody, Sjogrens syndrome-A extractable nuclear antibody, Sjogrens syndrome-B extractable nuclear antibody, Anti-DNA antibody, double-stranded, C3 and C4  Pain in both hands -She has been experiencing increased pain and stiffness in both hands intermittently.  No synovitis was noted on examination today.  Plan to update the following lab work today.  Can consider scheduling an ultrasound of both hands to assess for synovitis pending lab results.  Plan: Sedimentation rate, C-reactive protein, Rheumatoid factor, Cyclic citrul peptide antibody, IgG, Mutated Citrullinated Vimentin (MCV)  Antibody  Trochanteric bursitis of both hips: Intermittent discomfort.   Chronic pain of both hips: Painful ROM of both hips, left worse than right.   Primary osteoarthritis of both knees: She has intermittent warmth of both knees.   Primary osteoarthritis of both feet: Under the care of podiatry.  Recent injections helpful.   DDD (degenerative disc disease), cervical - Recent MRI by Dr. Penumalli-no radiographic progression when compared to  the previous MRI.S/p fusion-Dr. Unice 2017:EMG on 02/21/21:LUE unremarkable.  Spondylosis of lumbar spine - X-rays of the lumbar spine were consistent with facet joint arthropathy and mild dextroscoliosis. MRI of lumbar spine performed on 12/20/20. No symptoms of radiculopathy.   Osteoarthritis of both sacroiliac joints - Chronic pain.  HLA-B27 negative, MRI negative for sacroiliitis. MRI on 01/01/2018 revealed chronic fusion of superior aspect of right SI joint.  Dry eyes -Family history of Sjogren's-mother. Chronic eye dryness.  Plan to obtain the following lab work today.   Plan: Sjogrens syndrome-A extractable nuclear antibody, Sjogrens syndrome-B extractable nuclear antibody  Dry mouth -Family history of sjogren's. Chronic mouth dryness. Plan to obtain the following lab work today.  Plan: Sjogrens syndrome-A extractable nuclear antibody, Sjogrens syndrome-B extractable nuclear antibody  Fibromyalgia: Patient continues to experience intermittent allergies and muscle tenderness.  She has been experiencing increased muscle fatigue and weakness.  Plan to check CK today along with the following lab work for further evaluation.  Family history of rheumatoid arthritis  Other fatigue -She has been experiencing increased fatigue on a daily basis.  Patient is currently undergoing a workup with endocrinology for Cushing's syndrome.  Plan to also obtain the following lab work for further evaluation.  Plan: Sedimentation rate, C-reactive protein, Rheumatoid factor,  Cyclic citrul peptide antibody, IgG, Mutated Citrullinated Vimentin (MCV) Antibody, CK, Comprehensive metabolic panel with GFR, CBC with Differential/Platelet, ANA, RNP Antibody, Anti-Smith antibody, Anti-scleroderma antibody, Sjogrens syndrome-A extractable nuclear antibody, Sjogrens syndrome-B extractable nuclear antibody, Anti-DNA antibody, double-stranded, C3 and C4  Muscle fatigue -She presents today experiencing increased fatigue, myalgias, and muscle weakness.  Plan to check the following lab work today.  Plan: CK  Mouth sores -Intermittent mouth sores.  No nasal sores. Plan to obtain the following lab work today for further evaluation.  Plan: ANA, RNP Antibody, Anti-Smith antibody, Sjogrens syndrome-A extractable nuclear antibody, Sjogrens syndrome-B extractable nuclear antibody, Anti-DNA antibody, double-stranded, C3 and C4  Other medical conditions are listed as follows:   Hashimoto's thyroiditis  History of gastroesophageal reflux (GERD)  History of hyperparathyroidism  Vitamin B12 deficiency   Orders: Orders Placed This Encounter  Procedures   Sedimentation rate   C-reactive protein   Rheumatoid factor   Cyclic citrul peptide antibody, IgG   Mutated Citrullinated Vimentin (MCV) Antibody   CK   Comprehensive metabolic panel with GFR   CBC with Differential/Platelet   ANA   RNP Antibody   Anti-Smith antibody   Anti-scleroderma antibody   Sjogrens syndrome-A extractable nuclear antibody   Sjogrens syndrome-B extractable nuclear antibody   Anti-DNA antibody, double-stranded   C3 and C4   No orders of the defined types were placed in this encounter.   Follow-Up Instructions: No follow-ups on file.   Waddell CHRISTELLA Craze, PA-C  Note - This record has been created using Dragon software.  Chart creation errors have been sought, but may not always  have been located. Such creation errors do not reflect on  the standard of medical care.

## 2024-09-01 ENCOUNTER — Other Ambulatory Visit: Payer: Self-pay | Admitting: Internal Medicine

## 2024-09-01 DIAGNOSIS — R519 Headache, unspecified: Secondary | ICD-10-CM

## 2024-09-01 DIAGNOSIS — R7989 Other specified abnormal findings of blood chemistry: Secondary | ICD-10-CM

## 2024-09-01 DIAGNOSIS — H539 Unspecified visual disturbance: Secondary | ICD-10-CM

## 2024-09-01 DIAGNOSIS — E210 Primary hyperparathyroidism: Secondary | ICD-10-CM

## 2024-09-10 ENCOUNTER — Ambulatory Visit: Attending: Physician Assistant | Admitting: Physician Assistant

## 2024-09-10 ENCOUNTER — Encounter: Payer: Self-pay | Admitting: Physician Assistant

## 2024-09-10 VITALS — BP 129/82 | HR 62 | Temp 97.9°F | Resp 16 | Ht 65.0 in | Wt 245.8 lb

## 2024-09-10 DIAGNOSIS — M797 Fibromyalgia: Secondary | ICD-10-CM | POA: Diagnosis present

## 2024-09-10 DIAGNOSIS — M6289 Other specified disorders of muscle: Secondary | ICD-10-CM

## 2024-09-10 DIAGNOSIS — M19072 Primary osteoarthritis, left ankle and foot: Secondary | ICD-10-CM | POA: Diagnosis present

## 2024-09-10 DIAGNOSIS — K1379 Other lesions of oral mucosa: Secondary | ICD-10-CM

## 2024-09-10 DIAGNOSIS — R5383 Other fatigue: Secondary | ICD-10-CM | POA: Diagnosis present

## 2024-09-10 DIAGNOSIS — M170 Bilateral primary osteoarthritis of knee: Secondary | ICD-10-CM | POA: Diagnosis present

## 2024-09-10 DIAGNOSIS — E063 Autoimmune thyroiditis: Secondary | ICD-10-CM

## 2024-09-10 DIAGNOSIS — M461 Sacroiliitis, not elsewhere classified: Secondary | ICD-10-CM

## 2024-09-10 DIAGNOSIS — M47816 Spondylosis without myelopathy or radiculopathy, lumbar region: Secondary | ICD-10-CM

## 2024-09-10 DIAGNOSIS — M79642 Pain in left hand: Secondary | ICD-10-CM

## 2024-09-10 DIAGNOSIS — Z8719 Personal history of other diseases of the digestive system: Secondary | ICD-10-CM

## 2024-09-10 DIAGNOSIS — H04123 Dry eye syndrome of bilateral lacrimal glands: Secondary | ICD-10-CM

## 2024-09-10 DIAGNOSIS — M5030 Other cervical disc degeneration, unspecified cervical region: Secondary | ICD-10-CM

## 2024-09-10 DIAGNOSIS — M79641 Pain in right hand: Secondary | ICD-10-CM

## 2024-09-10 DIAGNOSIS — E538 Deficiency of other specified B group vitamins: Secondary | ICD-10-CM

## 2024-09-10 DIAGNOSIS — Z8261 Family history of arthritis: Secondary | ICD-10-CM | POA: Diagnosis present

## 2024-09-10 DIAGNOSIS — M7061 Trochanteric bursitis, right hip: Secondary | ICD-10-CM

## 2024-09-10 DIAGNOSIS — M25552 Pain in left hip: Secondary | ICD-10-CM | POA: Diagnosis present

## 2024-09-10 DIAGNOSIS — M19071 Primary osteoarthritis, right ankle and foot: Secondary | ICD-10-CM | POA: Insufficient documentation

## 2024-09-10 DIAGNOSIS — R682 Dry mouth, unspecified: Secondary | ICD-10-CM

## 2024-09-10 DIAGNOSIS — M1230 Palindromic rheumatism, unspecified site: Secondary | ICD-10-CM | POA: Diagnosis present

## 2024-09-10 DIAGNOSIS — G8929 Other chronic pain: Secondary | ICD-10-CM

## 2024-09-10 DIAGNOSIS — M25551 Pain in right hip: Secondary | ICD-10-CM

## 2024-09-10 DIAGNOSIS — M7062 Trochanteric bursitis, left hip: Secondary | ICD-10-CM

## 2024-09-10 DIAGNOSIS — Z8639 Personal history of other endocrine, nutritional and metabolic disease: Secondary | ICD-10-CM

## 2024-09-11 ENCOUNTER — Other Ambulatory Visit: Payer: Self-pay | Admitting: Medical Genetics

## 2024-09-11 ENCOUNTER — Ambulatory Visit: Payer: Self-pay | Admitting: Physician Assistant

## 2024-09-11 DIAGNOSIS — M1230 Palindromic rheumatism, unspecified site: Secondary | ICD-10-CM

## 2024-09-11 DIAGNOSIS — R5383 Other fatigue: Secondary | ICD-10-CM

## 2024-09-11 DIAGNOSIS — K1379 Other lesions of oral mucosa: Secondary | ICD-10-CM

## 2024-09-11 NOTE — Progress Notes (Signed)
 Calcium is elevated-11.1. total protein is elevated.   Please clarify if she has undergone a workup for hyperparathyroidism with her endocrinologist?   CBC WNL CK WNL

## 2024-09-13 LAB — SJOGRENS SYNDROME-B EXTRACTABLE NUCLEAR ANTIBODY: SSB (La) (ENA) Antibody, IgG: <1 AI

## 2024-09-13 LAB — CBC WITH DIFFERENTIAL/PLATELET
Absolute Lymphocytes: 1402 {cells}/uL (ref 850–3900)
Absolute Monocytes: 540 {cells}/uL (ref 200–950)
Basophils Absolute: 37 {cells}/uL (ref 0–200)
Basophils Relative: 0.5 %
Eosinophils Absolute: 80 {cells}/uL (ref 15–500)
Eosinophils Relative: 1.1 %
HCT: 39.8 % (ref 35.0–45.0)
Hemoglobin: 12.8 g/dL (ref 11.7–15.5)
MCH: 27.6 pg (ref 27.0–33.0)
MCHC: 32.2 g/dL (ref 32.0–36.0)
MCV: 85.8 fL (ref 80.0–100.0)
MPV: 12.3 fL (ref 7.5–12.5)
Monocytes Relative: 7.4 %
Neutro Abs: 5241 {cells}/uL (ref 1500–7800)
Neutrophils Relative %: 71.8 %
Platelets: 258 Thousand/uL (ref 140–400)
RBC: 4.64 Million/uL (ref 3.80–5.10)
RDW: 14.3 % (ref 11.0–15.0)
Total Lymphocyte: 19.2 %
WBC: 7.3 Thousand/uL (ref 3.8–10.8)

## 2024-09-13 LAB — COMPREHENSIVE METABOLIC PANEL WITH GFR
AG Ratio: 1 (calc) (ref 1.0–2.5)
ALT: 13 U/L (ref 6–29)
AST: 15 U/L (ref 10–35)
Albumin: 4.3 g/dL (ref 3.6–5.1)
Alkaline phosphatase (APISO): 101 U/L (ref 37–153)
BUN: 17 mg/dL (ref 7–25)
CO2: 25 mmol/L (ref 20–32)
Calcium: 11.1 mg/dL — ABNORMAL HIGH (ref 8.6–10.4)
Chloride: 105 mmol/L (ref 98–110)
Creat: 0.73 mg/dL (ref 0.50–1.05)
Globulin: 4.2 g/dL — ABNORMAL HIGH (ref 1.9–3.7)
Glucose, Bld: 97 mg/dL (ref 65–99)
Potassium: 4.6 mmol/L (ref 3.5–5.3)
Sodium: 137 mmol/L (ref 135–146)
Total Bilirubin: 0.6 mg/dL (ref 0.2–1.2)
Total Protein: 8.5 g/dL — ABNORMAL HIGH (ref 6.1–8.1)
eGFR: 90 mL/min/1.73m2 (ref >=60)

## 2024-09-13 LAB — ANTI-SCLERODERMA ANTIBODY: Scleroderma (Scl-70) (ENA) Antibody, IgG: <1 AI

## 2024-09-13 LAB — ANA: Anti Nuclear Antibody (ANA): POSITIVE — AB

## 2024-09-13 LAB — SEDIMENTATION RATE: Sed Rate: 63 mm/h — ABNORMAL HIGH (ref 0–30)

## 2024-09-13 LAB — SJOGRENS SYNDROME-A EXTRACTABLE NUCLEAR ANTIBODY: SSA (Ro) (ENA) Antibody, IgG: <1 AI

## 2024-09-13 LAB — C3 AND C4
C3 Complement: 207 mg/dL — ABNORMAL HIGH (ref 83–193)
C4 Complement: 33 mg/dL (ref 15–57)

## 2024-09-13 LAB — CYCLIC CITRUL PEPTIDE ANTIBODY, IGG: Cyclic Citrullin Peptide Ab: <16 U

## 2024-09-13 LAB — ANTI-SMITH ANTIBODY: ENA SM Ab Ser-aCnc: <1 AI

## 2024-09-13 LAB — C-REACTIVE PROTEIN: CRP: 6.4 mg/L (ref <8.0)

## 2024-09-13 LAB — CK: Total CK: 69 U/L (ref 20–243)

## 2024-09-13 LAB — RNP ANTIBODY: Ribonucleic Protein(ENA) Antibody, IgG: <1 AI

## 2024-09-13 LAB — ANTI-NUCLEAR AB-TITER (ANA TITER)
ANA TITER: 1:40 {titer} — ABNORMAL HIGH
ANA Titer 1: 1:40 {titer} — ABNORMAL HIGH

## 2024-09-13 LAB — RHEUMATOID FACTOR: Rheumatoid fact SerPl-aCnc: <10 [IU]/mL (ref <14)

## 2024-09-13 LAB — MUTATED CITRULLINATED VIMENTIN (MCV) ANTIBODY: MUTATED CITRULLINATED VIMENTIN (MCV) AB: <20 U/mL (ref <20)

## 2024-09-13 LAB — ANTI-DNA ANTIBODY, DOUBLE-STRANDED: ds DNA Ab: <1 [IU]/mL

## 2024-09-15 NOTE — Telephone Encounter (Signed)
 Patient contacted the office and states she noticed that her ANA came back abnormal and wanted to know what that means. Please advise.

## 2024-09-15 NOTE — Progress Notes (Signed)
 We only worry if the C3 and C4 are low.  Her C3 is elevated-not of concern.

## 2024-09-15 NOTE — Progress Notes (Signed)
 ANA is a very low titer-usually not considered a true positive if the titer is less than 1:160.  Recommend recheck in 6 months. Additional labs for lupus were negative.   Labs for RA are negative

## 2024-10-11 ENCOUNTER — Ambulatory Visit
Admission: RE | Admit: 2024-10-11 | Discharge: 2024-10-11 | Disposition: A | Discharge status: Home / Self Care | Source: Ambulatory Visit | Attending: Neurological Surgery | Admitting: Neurological Surgery

## 2024-10-11 ENCOUNTER — Ambulatory Visit
Admission: RE | Admit: 2024-10-11 | Discharge: 2024-10-11 | Disposition: A | Discharge status: Home / Self Care | Source: Ambulatory Visit | Attending: Internal Medicine | Admitting: Internal Medicine

## 2024-10-11 DIAGNOSIS — R519 Headache, unspecified: Secondary | ICD-10-CM

## 2024-10-11 DIAGNOSIS — R7989 Other specified abnormal findings of blood chemistry: Secondary | ICD-10-CM

## 2024-10-11 DIAGNOSIS — M5412 Radiculopathy, cervical region: Secondary | ICD-10-CM

## 2024-10-11 DIAGNOSIS — E210 Primary hyperparathyroidism: Secondary | ICD-10-CM

## 2024-10-11 DIAGNOSIS — H539 Unspecified visual disturbance: Secondary | ICD-10-CM

## 2024-10-22 ENCOUNTER — Other Ambulatory Visit

## 2024-10-22 DIAGNOSIS — Z006 Encounter for examination for normal comparison and control in clinical research program: Secondary | ICD-10-CM

## 2024-10-25 NOTE — Progress Notes (Deleted)
 Cardiology Office Note   Date:  10/25/2024   ID:  Meghan Owen, DOB 09/09/1956, MRN [id number]  PCP:  Teresa Channel, MD  Cardiologist:   Vina Gull, MD     History of Present Illness: Meghan Owen is a 68 y.o. female with a history of palpitations and thyroiditis   She called in complaining of squeezing chest pain   Happens anytime, with and without activity   Random   NOt every day   On and off   Started 2 wks ago .   Spells last a few seconds   Enough to get attention. Denies recent infection  No problems swallowing food  No SOB  No N/   Never had before   Not position.   Not pleuritic     Patient walks  Does get a little winded with walking   Does some strength training    I saw the pt in clinic in Feb 2023   No outpatient medications have been marked as taking for the 10/28/24 encounter (Appointment) with Gull Vina GAILS, MD.     Allergies:   Aspirin, Penicillin v potassium, Caffeine, Iodinated contrast media, Iodine-131, Ioxaglate, Penicillins, and Prednisone   Past Medical History:  Diagnosis Date   Arthritis    Bursitis, hip    bilateral   Chronic instability of ankle 01/2024   both ankles   DDD (degenerative disc disease), lumbar    Dyslipidemia    Elevated sed rate    Fatty liver    Fibromyalgia    GERD (gastroesophageal reflux disease)    HX   Hashimoto's thyroiditis    Headache    History of COVID-19 08/2023   Hypercalcemia    Hyperparathyroidism    Dr. Mirna   Hypothyroidism    Liver cyst    BENIGN   Obesity    Osteoarthritis    Parathyroid  disorder    HYPERPARATHYROID   Paresthesia    Peripheral neuropathy    Spinal stenosis    Synovial cyst of elbow    Thyroid  disease    Tremor    Vitamin D  deficiency     Past Surgical History:  Procedure Laterality Date   ABDOMINAL HYSTERECTOMY     ANKLE SURGERY     ANTERIOR CERVICAL DECOMPRESSION/DISCECTOMY FUSION 4 LEVELS N/A 09/05/2016   Procedure: Cervical three-four Cervical four-five   Cervical five-six Cervical six-seven  Anterior cervical decompression/diskectomy/fusion;  Surgeon: Fairy Levels, MD;  Location: [facility];  Service: Neurosurgery;  Laterality: N/A;   CARPAL TUNNEL RELEASE Left 02/26/2018   ELBOW SURGERY  04/2017   cyst removed    FOOT SURGERY     TOE SURGERY Bilateral      Social History:  The patient  reports that she quit smoking about 27 years ago. Her smoking use included cigarettes. She started smoking about 67 years ago. She has never been exposed to tobacco smoke. She has never used smokeless tobacco. She reports that she does not drink alcohol and does not use drugs.   Family History:  The patient's family history includes Cancer in her sister and another family member; Dementia in her father; Healthy in her daughter, son, and son; Leukemia in her father; Lupus in her mother; Rheum arthritis in her mother.    ROS:  Please see the history of present illness. All other systems are reviewed and  Negative to the above problem except as noted.    PHYSICAL EXAM: VS:  There were no vitals taken for  this visit.  GEN: Obese 68 yo  in no acute distress  HEENT: normal  Neck: no JVD, no carotid bruits Cardiac: RRR; no murmurs  No LE edema  Respiratory:  clear to auscultation bilaterally,  GI: soft, nontender, nondistended, + BS  No hepatomegaly  MS: no deformity Moving all extremities   Skin: warm and dry, no rash Neuro:  Strength and sensation are intact Psych: euthymic mood, full affect   EKG:  EKG is ordered today.  SB 58 bpm       Lipid Panel No results found for: CHOL, TRIG, HDL, CHOLHDL, VLDL, LDLCALC, LDLDIRECT    Wt Readings from Last 3 Encounters:  09/10/24 245 lb 12.8 oz (111.5 kg)  06/12/24 236 lb 8.9 oz (107.3 kg)  05/12/24 236 lb 9.6 oz (107.3 kg)      ASSESSMENT AND PLAN:  1  Chest discomfort   Atypical   Very short lived  Not associated with activity    Pt very  concerned about CAD (Hx poss parathyroid   issues; had normal caroitd scan in past) Recomm   I would recomm Ca score CT to see burden of plaquing    This will guide aggressiveness of follow up/interrogation Follow for now    continue to watch for associations   2  Lipids   Will check Lipomed, Lpa, ApoB  3  Glucose   Will check A1C     4  Plapitations   Follow   Pt is not that troubled by      Current medicines are reviewed at length with the patient today.  The patient does not have concerns regarding medicines.  Signed, Vina Gull, MD  10/25/2024 9:58 PM    [facility] [address] [place], [place], [place]  [address] Phone: [phone]; Fax: [phone]

## 2024-10-28 ENCOUNTER — Ambulatory Visit: Admitting: Internal Medicine

## 2024-11-03 LAB — GENECONNECT MOLECULAR SCREEN: Genetic Analysis Overall Interpretation: NEGATIVE

## 2024-11-04 NOTE — Progress Notes (Deleted)
 Office Visit Note  Patient: Meghan Owen             Date of Birth: 05-06-1956           MRN: [id number]             PCP: Teresa Channel, MD Referring: Teresa Channel, MD Visit Date: 11/18/2024 Occupation: Data Unavailable  Subjective:  No chief complaint on file.   History of Present Illness: Meghan Owen is a 68 y.o. female ***     Activities of Daily Living:  Patient reports morning stiffness for *** {minute/hour:19697}.   Patient {ACTIONS;DENIES/REPORTS:21021675::Denies} nocturnal pain.  Difficulty dressing/grooming: {ACTIONS;DENIES/REPORTS:21021675::Denies} Difficulty climbing stairs: {ACTIONS;DENIES/REPORTS:21021675::Denies} Difficulty getting out of chair: {ACTIONS;DENIES/REPORTS:21021675::Denies} Difficulty using hands for taps, buttons, cutlery, and/or writing: {ACTIONS;DENIES/REPORTS:21021675::Denies}  No Rheumatology ROS completed.   PMFS History:  Patient Active Problem List   Diagnosis Date Noted   Elevated C-reactive protein (CRP) 06/01/2021   Paresthesia 01/31/2021   Osteoarthritis of both sacroiliac joints 01/04/2018   Primary osteoarthritis of both knees 01/04/2018   DDD (degenerative disc disease), cervical 01/04/2018   DDD (degenerative disc disease), lumbar 01/04/2018   Chronic right shoulder pain 01/04/2018   Fibromyalgia 01/04/2018   Hashimoto's thyroiditis 01/04/2018   History of hyperparathyroidism 01/04/2018   History of gastroesophageal reflux (GERD) 01/04/2018   Family history of rheumatoid arthritis 01/04/2018   Cervical myelopathy (HCC) 09/05/2016   Primary osteoarthritis of left hip 01/04/2016   Chronic left hip pain 12/15/2014   Hypercalcemia 09/08/2013   Varus deformity of foot 08/26/2013   Peroneal tendonitis 05/23/2013   Conversion disorder 05/21/2013   Low back pain 11/01/2012   Achilles tendinitis 09/05/2012   Bilateral ankle pain 09/05/2012   Hammer toe, acquired 09/05/2012   Cubital tunnel syndrome on left  08/22/2012   Stress fracture 07/04/2012   Lumbar facet arthropathy 05/10/2012   Diffuse myofascial pain syndrome 01/25/2012   GERD 02/05/2009   OTHER BURSITIS DISORDERS 02/05/2009   ABNORMAL ELECTROCARDIOGRAM 02/05/2009    Past Medical History:  Diagnosis Date   Arthritis    Bursitis, hip    bilateral   Chronic instability of ankle 01/2024   both ankles   DDD (degenerative disc disease), lumbar    Dyslipidemia    Elevated sed rate    Fatty liver    Fibromyalgia    GERD (gastroesophageal reflux disease)    HX   Hashimoto's thyroiditis    Headache    History of COVID-19 08/2023   Hypercalcemia    Hyperparathyroidism    Dr. Mirna   Hypothyroidism    Liver cyst    BENIGN   Obesity    Osteoarthritis    Parathyroid  disorder    HYPERPARATHYROID   Paresthesia    Peripheral neuropathy    Spinal stenosis    Synovial cyst of elbow    Thyroid  disease    Tremor    Vitamin D  deficiency     Family History  Problem Relation Age of Onset   Lupus Mother    Rheum arthritis Mother    Dementia Father    Leukemia Father    Cancer Sister        bone    Healthy Daughter    Healthy Son    Healthy Son    Cancer Other    Past Surgical History:  Procedure Laterality Date   ABDOMINAL HYSTERECTOMY     ANKLE SURGERY     ANTERIOR CERVICAL DECOMPRESSION/DISCECTOMY FUSION 4 LEVELS N/A 09/05/2016   Procedure: Cervical three-four  Cervical four-five  Cervical five-six Cervical six-seven  Anterior cervical decompression/diskectomy/fusion;  Surgeon: Fairy Levels, MD;  Location: [facility];  Service: Neurosurgery;  Laterality: N/A;   CARPAL TUNNEL RELEASE Left 02/26/2018   ELBOW SURGERY  04/2017   cyst removed    FOOT SURGERY     TOE SURGERY Bilateral    Social History   Tobacco Use   Smoking status: Former    Current packs/day: 0.00    Types: Cigarettes    Start date: 53    Quit date: 1998    Years since quitting: 27.9    Passive exposure: Never   Smokeless tobacco:  Never   Tobacco comments:    1 pack per week  Vaping Use   Vaping status: Never Used  Substance Use Topics   Alcohol use: No   Drug use: No   Social History   Social History Narrative   Caffeine yes, widowed.  3 children.       There is no immunization history on file for this patient.   Objective: Vital Signs: There were no vitals taken for this visit.   Physical Exam   Musculoskeletal Exam: ***  CDAI Exam: CDAI Score: -- Patient Global: --; Provider Global: -- Swollen: --; Tender: -- Joint Exam 11/18/2024   No joint exam has been documented for this visit   There is currently no information documented on the homunculus. Go to the Rheumatology activity and complete the homunculus joint exam.  Investigation: No additional findings.  Imaging: MR BRAIN WO CONTRAST Result Date: 10/15/2024 EXAM: MRI BRAIN WITHOUT CONTRAST 10/11/2024 11:26:30 AM TECHNIQUE: Multiplanar multisequence MRI of the head/brain was performed without the administration of intravenous contrast. COMPARISON: MR Head without with contrast 09/12/2023. CLINICAL HISTORY: Vision changes, elevated cortisol, daily head aches, primary hyperparathyroidism, allergy to MR contrast. FINDINGS: BRAIN AND VENTRICLES: No acute infarct. No intracranial hemorrhage. No mass. No midline shift. No hydrocephalus. No contrast agent was administered which limits assessment for microadenomas. The infundibulum is midline. The volume of the pituitary gland is within normal limits. Normal flow voids. ORBITS: No acute abnormality. SINUSES AND MASTOIDS: No acute abnormality. BONES AND SOFT TISSUES: Normal marrow signal. No acute soft tissue abnormality. IMPRESSION: 1. No acute intracranial abnormality. 2. Limited evaluation for pituitary microadenoma due to absence of contrast administration. Electronically signed by: Franky Stanford MD 10/15/2024 01:14 PM EST RP Workstation: [id number]   MR CERVICAL SPINE WO CONTRAST Result Date:  10/12/2024 CLINICAL DATA:  Initial evaluation for neck pain with left hand weakness, right hand numbness for 1 year. EXAM: MRI CERVICAL SPINE WITHOUT CONTRAST TECHNIQUE: Multiplanar, multisequence MR imaging of the cervical spine was performed. No intravenous contrast was administered. COMPARISON:  Prior MRI from 09/15/2023. FINDINGS: Alignment: Straightening of the normal cervical lordosis. Trace facet mediated anterolisthesis of T1 on T2. Vertebrae: Prior ACDF at C3-C7. Vertebral body height maintained without acute or chronic fracture. Bone marrow signal intensity within normal limits. No worrisome osseous lesions. Degenerative endplate Schmorl's node deformity with reactive marrow edema noted at the inferior endplate of T1. Cord: Normal signal and morphology. Posterior Fossa, vertebral arteries, paraspinal tissues: Unremarkable. Disc levels: C2-C3: Minimal annular disc bulge. Moderate left with mild right facet arthrosis. No spinal stenosis. Foramina remain patent. C3-C4: Prior fusion. Endplate osseous spurring flattens the ventral thecal sac without significant residual spinal stenosis. Left-sided uncovertebral spurring with residual mild left C4 foraminal stenosis. Appearance is stable. C4-C5: Prior fusion. No residual canal or foraminal stenosis. Appearance is stable. C5-C6: Prior fusion. Right  paracentral endplate osseous spurring flattens the ventral thecal sac. Residual mild cord flattening without cord signal changes or significant residual spinal stenosis. Foramina appear adequately patent. C6-C7: Prior fusion. Endplate osseous ridging mildly flattens and indents the left ventral thecal sac. No significant residual spinal stenosis. Foramina appear patent. Appearance is stable. C7-T1: Degenerative intervertebral disc space narrowing with diffuse disc bulge and bilateral uncovertebral spurring. Mild left-sided facet hypertrophy. No spinal stenosis. Foramina remain patent. IMPRESSION: 1. Prior ACDF at C3-C7  without residual spinal stenosis. Mild left C4 foraminal narrowing related to uncovertebral disease is unchanged. 2. Degenerative disc bulge with uncovertebral spurring at C7-T1 without significant stenosis or neural impingement. 3. Moderate left-sided facet arthrosis at C2-3. 4. Degenerative endplate Schmorl's node deformity with reactive marrow edema at the inferior endplate of T1. Finding could contribute to neck pain. Electronically Signed   By: Morene Hoard M.D.   On: 10/12/2024 01:17    Recent Labs: Lab Results  Component Value Date   WBC 7.3 09/10/2024   HGB 12.8 09/10/2024   PLT 258 09/10/2024   NA 137 09/10/2024   K 4.6 09/10/2024   CL 105 09/10/2024   CO2 25 09/10/2024   GLUCOSE 97 09/10/2024   BUN 17 09/10/2024   CREATININE 0.73 09/10/2024   BILITOT 0.6 09/10/2024   ALKPHOS 104 06/12/2024   AST 15 09/10/2024   ALT 13 09/10/2024   PROT 8.5 (H) 09/10/2024   ALBUMIN  3.7 06/12/2024   CALCIUM 11.1 (H) 09/10/2024   GFRAA 102 08/20/2019    Speciality Comments: No specialty comments available.  Procedures:  No procedures performed Allergies: Aspirin, Penicillin v potassium, Caffeine, Iodinated contrast media, Iodine-131, Ioxaglate, Penicillins, and Prednisone   Assessment / Plan:     Visit Diagnoses: No diagnosis found.  Orders: No orders of the defined types were placed in this encounter.  No orders of the defined types were placed in this encounter.   Face-to-face time spent with patient was *** minutes. Greater than 50% of time was spent in counseling and coordination of care.  Follow-Up Instructions: No follow-ups on file.   Daved JAYSON Gavel, CMA  Note - This record has been created using Animal nutritionist.  Chart creation errors have been sought, but may not always  have been located. Such creation errors do not reflect on  the standard of medical care.

## 2024-11-18 ENCOUNTER — Ambulatory Visit: Admitting: Rheumatology

## 2024-11-18 DIAGNOSIS — M79641 Pain in right hand: Secondary | ICD-10-CM

## 2024-11-18 DIAGNOSIS — Z8639 Personal history of other endocrine, nutritional and metabolic disease: Secondary | ICD-10-CM

## 2024-11-18 DIAGNOSIS — Z8261 Family history of arthritis: Secondary | ICD-10-CM

## 2024-11-18 DIAGNOSIS — Z8719 Personal history of other diseases of the digestive system: Secondary | ICD-10-CM

## 2024-11-18 DIAGNOSIS — G8929 Other chronic pain: Secondary | ICD-10-CM

## 2024-11-18 DIAGNOSIS — M1230 Palindromic rheumatism, unspecified site: Secondary | ICD-10-CM

## 2024-11-18 DIAGNOSIS — E538 Deficiency of other specified B group vitamins: Secondary | ICD-10-CM

## 2024-11-18 DIAGNOSIS — E063 Autoimmune thyroiditis: Secondary | ICD-10-CM

## 2024-11-18 DIAGNOSIS — H04123 Dry eye syndrome of bilateral lacrimal glands: Secondary | ICD-10-CM

## 2024-11-18 DIAGNOSIS — M47816 Spondylosis without myelopathy or radiculopathy, lumbar region: Secondary | ICD-10-CM

## 2024-11-18 DIAGNOSIS — K1379 Other lesions of oral mucosa: Secondary | ICD-10-CM

## 2024-11-18 DIAGNOSIS — M5030 Other cervical disc degeneration, unspecified cervical region: Secondary | ICD-10-CM

## 2024-11-18 DIAGNOSIS — M19071 Primary osteoarthritis, right ankle and foot: Secondary | ICD-10-CM

## 2024-11-18 DIAGNOSIS — M170 Bilateral primary osteoarthritis of knee: Secondary | ICD-10-CM

## 2024-11-18 DIAGNOSIS — R5383 Other fatigue: Secondary | ICD-10-CM

## 2024-11-18 DIAGNOSIS — M797 Fibromyalgia: Secondary | ICD-10-CM

## 2024-11-18 DIAGNOSIS — M7061 Trochanteric bursitis, right hip: Secondary | ICD-10-CM

## 2024-11-18 DIAGNOSIS — R682 Dry mouth, unspecified: Secondary | ICD-10-CM

## 2024-11-18 DIAGNOSIS — M461 Sacroiliitis, not elsewhere classified: Secondary | ICD-10-CM

## 2024-11-18 DIAGNOSIS — M6289 Other specified disorders of muscle: Secondary | ICD-10-CM

## 2025-02-10 ENCOUNTER — Ambulatory Visit: Admitting: Physician Assistant
